# Patient Record
Sex: Female | Born: 1980
Health system: Southern US, Community
[De-identification: ages and names within clinical notes are randomized; demographics above are authoritative.]

## PROBLEM LIST (undated history)

## (undated) DIAGNOSIS — T7840XA Allergy, unspecified, initial encounter: Secondary | ICD-10-CM

## (undated) DIAGNOSIS — U071 COVID-19: Secondary | ICD-10-CM

## (undated) DIAGNOSIS — G43909 Migraine, unspecified, not intractable, without status migrainosus: Secondary | ICD-10-CM

## (undated) HISTORY — DX: COVID-19: U07.1

## (undated) HISTORY — PX: DILATION AND CURETTAGE OF UTERUS: SHX78

## (undated) HISTORY — PX: TONSILLECTOMY: SUR1361

## (undated) HISTORY — PX: WISDOM TOOTH EXTRACTION: SHX21

## (undated) HISTORY — PX: OTHER SURGICAL HISTORY: SHX169

## (undated) HISTORY — DX: Allergy, unspecified, initial encounter: T78.40XA

## (undated) HISTORY — DX: Migraine, unspecified, not intractable, without status migrainosus: G43.909

---

## 2001-04-24 ENCOUNTER — Other Ambulatory Visit: Admission: RE | Admit: 2001-04-24 | Discharge: 2001-04-24 | Payer: Self-pay | Admitting: Family Medicine

## 2002-07-08 ENCOUNTER — Ambulatory Visit (HOSPITAL_BASED_OUTPATIENT_CLINIC_OR_DEPARTMENT_OTHER): Admission: RE | Admit: 2002-07-08 | Discharge: 2002-07-08 | Payer: Self-pay | Admitting: Otolaryngology

## 2005-12-28 ENCOUNTER — Ambulatory Visit: Payer: Self-pay | Admitting: Unknown Physician Specialty

## 2006-10-17 ENCOUNTER — Observation Stay: Payer: Self-pay

## 2006-10-24 ENCOUNTER — Observation Stay: Payer: Self-pay

## 2007-01-14 ENCOUNTER — Observation Stay: Payer: Self-pay

## 2007-01-18 ENCOUNTER — Inpatient Hospital Stay: Payer: Self-pay

## 2010-09-05 ENCOUNTER — Observation Stay: Payer: Self-pay

## 2010-09-18 ENCOUNTER — Inpatient Hospital Stay: Payer: Self-pay

## 2012-07-28 ENCOUNTER — Ambulatory Visit: Payer: Self-pay | Admitting: Neurology

## 2013-06-01 DIAGNOSIS — G43109 Migraine with aura, not intractable, without status migrainosus: Secondary | ICD-10-CM

## 2013-06-01 DIAGNOSIS — G43809 Other migraine, not intractable, without status migrainosus: Secondary | ICD-10-CM | POA: Insufficient documentation

## 2014-04-26 ENCOUNTER — Encounter
Admit: 2014-04-26 | Disposition: A | Discharge status: Home / Self Care | Payer: Self-pay | Attending: Nurse Practitioner | Admitting: Nurse Practitioner

## 2014-05-20 ENCOUNTER — Encounter
Admit: 2014-05-20 | Disposition: A | Discharge status: Home / Self Care | Payer: Self-pay | Attending: Nurse Practitioner | Admitting: Nurse Practitioner

## 2014-06-23 ENCOUNTER — Encounter: Payer: Self-pay | Admitting: Physical Therapy

## 2014-06-30 ENCOUNTER — Encounter: Payer: Self-pay | Admitting: Physical Therapy

## 2014-06-30 ENCOUNTER — Ambulatory Visit: Payer: Managed Care, Other (non HMO) | Attending: Nurse Practitioner | Admitting: Physical Therapy

## 2014-06-30 DIAGNOSIS — M6208 Separation of muscle (nontraumatic), other site: Secondary | ICD-10-CM

## 2014-06-30 DIAGNOSIS — S39001S Unspecified injury of muscle, fascia and tendon of abdomen, sequela: Secondary | ICD-10-CM

## 2014-06-30 DIAGNOSIS — R278 Other lack of coordination: Secondary | ICD-10-CM

## 2014-06-30 DIAGNOSIS — N393 Stress incontinence (female) (male): Secondary | ICD-10-CM | POA: Insufficient documentation

## 2014-07-01 NOTE — Patient Instructions (Signed)
Handout on core exercises to refrain from due to demo'ing lumbar lordosis or straining of pelvic floor mm (see Ex under Treatments) Handout on dynamic stabilization Level 1-3.   Reviewed post-run exercises and guided side-flexion with verbal cues.

## 2014-07-01 NOTE — Therapy (Signed)
[place] MAIN [facility] SERVICES [address] [place], [place], [address] Phone: [phone]   Fax:  [phone]  Physical Therapy Treatment  Patient Details  Name: Carolyn Johnston MRN: [id number] Date of Birth: May 09, 1980 Referring Provider:  Josie Saunders, NP  Encounter Date: 06/30/2014      PT End of Session - 07/01/14 2125    Visit Number 3   Number of Visits 12   Date for PT Re-Evaluation 07/25/14   PT Start Time 2035   PT Stop Time 2130   PT Time Calculation (min) 55 min   Activity Tolerance Patient tolerated treatment well;No increased pain   Behavior During Therapy [facility] for tasks assessed/performed      Past Medical History  Diagnosis Date  . Allergy     trees, dust mites  . Migraines     Past Surgical History  Procedure Laterality Date  . Tonsillectomy    . Dilation and curettage of uterus    . Adnoids suture      There were no vitals filed for this visit.  Visit Diagnosis:  Abnormal coordination  Unspecified injury of muscle, fascia and tendon of abdomen, sequela  Diastasis recti      Subjective Assessment - 06/30/14 1650    Subjective Pt skipped last week's session due to conflicting schedule. Pt reported one urinary leakage epsiode with a sneeze across 2 weeks and had pain with intercourse but was able to adjust. Pt reported she started a new core workout routine. Pt                       Pelvic Floor Special Questions - 06/30/14 2120    Pelvic Floor Internal Exam Noted increased pelvic floor mm ROM with breathing    Exam Type Vaginal   Palpation significantly decreased tensions and tenderness anterior pelvic floor mm compared to previous sessions           [facility] Adult PT Treatment/Exercise - 06/30/14 2121    Exercises   Exercises Other Exercises   Other Exercises  pt demo'd self-selected "core" exercises.Advised pt to  refrain from toe touches(standing), crunches, planks. Guided pt  with neuroredu on dynstabilization L1-3. Pt demo'd correctly with minor tactile Sharlyne Pacas cuing. 5 reps each set.                PT Education - 07/01/14 2125    Education provided Yes   Education Details HEP   Person(s) Educated Patient   Methods Explanation;Demonstration;Tactile cues;Verbal cues;Handout   Comprehension Verbalized understanding;Returned demonstration          PT Short Term Goals - 07/01/14 2134    PT SHORT TERM GOAL #1   Title Pt. will be able to demonstrate coordination with the pelvic diaphragm and breathing diaphragm independently in 4 weeks.    Time 4   Period Weeks   Status Achieved   PT SHORT TERM GOAL #2   Title Pt. will tolerate a normal urinary frequency (2-3 hours) in 6 weeks   Time 6   Period Weeks   Status Partially Met   PT SHORT TERM GOAL #3   Title  Pt will demo no pelvic obliquities across 2 visits in order to progress to strengthening exercises and decrease pain in 6 weeks   Time 6   Period Weeks   Status Partially Met           PT Long Term Goals - 07/01/14 2132  PT LONG TERM GOAL #1   Title Pt would report a decrease in migraines by 50% during 1 week in order to perform work duties at optimal capacity in 12 weeks   Time 12   Period Weeks   Status On-going   PT [place] #2   Title Pt will be independent with completion of home exercise program for pelvic floor exercise, progressing to standing for all ADL's/activities in 4 weeks   Time 12   Period Weeks   Status On-going   PT LONG TERM GOAL #3   Title Patient will decrease her score on PFDI from 37.5% to < 25% in order to improve QOL in 12 weeks   Time 12   Period Weeks   Status On-going   PT LONG TERM GOAL #4   Title Patient will report decreased leaking with stress induced activities such as jumping on the trampoline for 3 min in order to improve QOL in 12 weeks   Time 12   Status On-going               Plan - 07/01/14 2127    Clinical Impression  Statement Pt is progressing towards her goals.She demo'd decreased tensions and tenderness of pelvic floor mm and showed improved pelvic floor ROM with improved breathing technique. Pt remains compliant with HEP and demo'd initiation of dyn stab L1-3 correctly.  Pt will continue benefit from skilled PT to address a need for deep core strengthening.    Pt will benefit from skilled therapeutic intervention in order to improve on the following deficits Decreased coordination;Decreased range of motion;Impaired flexibility;Improper body mechanics;Postural dysfunction;Decreased safety awareness;Decreased endurance;Increased fascial restricitons;Decreased activity tolerance;Decreased mobility;Decreased strength;Increased muscle spasms   Rehab Potential Good   PT Frequency 1x / week   PT Duration 12 weeks   PT Treatment/Interventions ADLs/Self Care Home Management;Moist Heat;Therapeutic activities;Patient/family education;Therapeutic exercise;Biofeedback;Manual techniques;Cryotherapy;Neuromuscular re-education;Functional mobility training;Dry needling;Aquatic Therapy   PT Next Visit Plan reassess DRA, progress to dyn stab L4, posterior chain strengthening with bands   Consulted and Agree with Plan of Care Patient        Problem List There are no active problems to display for this patient.   Jerl Mina ,PT, DPT, E-RYT  07/01/2014, 9:37 PM  [place] MAIN [facility] SERVICES [address] [place], [place], [address] Phone: [phone]   Fax:  [phone]

## 2014-07-07 ENCOUNTER — Ambulatory Visit: Payer: Managed Care, Other (non HMO) | Admitting: Physical Therapy

## 2014-07-07 DIAGNOSIS — M6208 Separation of muscle (nontraumatic), other site: Secondary | ICD-10-CM

## 2014-07-07 DIAGNOSIS — S39001S Unspecified injury of muscle, fascia and tendon of abdomen, sequela: Secondary | ICD-10-CM

## 2014-07-07 DIAGNOSIS — R278 Other lack of coordination: Secondary | ICD-10-CM

## 2014-07-07 DIAGNOSIS — N393 Stress incontinence (female) (male): Secondary | ICD-10-CM | POA: Diagnosis not present

## 2014-07-07 NOTE — Patient Instructions (Signed)
__HEP: jumprope 30 sec with exhalations __Handout for position modifications and tips to decrease pain with sex __Handout for previous HEP for stretches.

## 2014-07-08 NOTE — Therapy (Signed)
[facility] [address] [place], [place], [address] Phone: [phone]   Fax:  [phone]  Physical Therapy Treatment  Patient Details  Name: Carolyn Johnston MRN: [id number] Date of Birth: 1980-02-27 Referring Provider:  Lawernce PittsHuprich, Erin E, NP  Encounter Date: 07/07/2014      PT End of Session - 07/08/14 1527    Visit Number 4   Number of Visits 12   Date for PT Re-Evaluation 07/25/14   PT Start Time 2035   PT Stop Time 2135   PT Time Calculation (min) 60 min   Activity Tolerance Patient tolerated treatment well;No increased pain   Behavior [facility] for tasks assessed/performed      Past Medical History  Diagnosis Date  . Allergy     trees, dust mites  . Migraines     Past Surgical History  Procedure Laterality Date  . Tonsillectomy    . Dilation and curettage of uterus    . Adnoids suture      There were no vitals filed for this visit.  Visit Diagnosis:  Abnormal coordination  Unspecified injury of muscle, fascia and tendon of abdomen, sequela  Diastasis recti      Subjective Assessment - 07/07/14 1711    Subjective Pt has been selecting less bladder irritants and making healthier choices. Pt had one episode of leakage after burping and laughing. Pt reported she is noticing her posture at church and toilet and correcting herself when she slouches. Pt has noticed decreased hip pain with exercises. Pt is concerned about pain during intercourse.  Pt no longer feels the immediate urge to go to the toilet with initial sensation to urinate and also has difficulty with emptying. Pt also reported she is able to ER and abduct her hips in supine/ hip flexion which used to cause her pain.                        Pelvic Floor Special Questions - 07/08/14 1515    Pelvic Floor Internal Exam Noted minimal PFM tensions    Exam Type Vaginal   Palpation significantly decreased tensions and  tenderness anterior pelvic floor mm compared to previous sessions           [facility] Adult PT Treatment/Exercise - 07/08/14 0001    Bed Mobility   Bed Mobility Supine to Sit   Supine to Sit --  body mechanics for < pain w/ intercourse (positions/PFM   Exercises   Exercises Other Exercises  reviewed stretches    Knee/Hip Exercises: Plyometrics   Bilateral Jumping 5 reps  cue to land soft w/ deep core engaged   Other Plyometric Exercises jumping onto low stool and back down with eccentric control   5x   prep for dynamic movement in Zumba class   Other Plyometric Exercises propioception to disassociate pelvic from thorax to prep for dynamic movement in Zumba class  demo'd correctly   Manual Therapy   Manual Therapy Internal Pelvic Floor   Manual therapy comments Noted slight PFM tensions and tenderness anterior obt int and pubococygeus/rectalis mm,    Internal Pelvic Floor theile and sustained pressure. decreased S& S post-Tx.                 PT Education - 07/08/14 1514    Education provided Yes   Education Details HEP   Person(s) Educated Patient   Methods Explanation;Demonstration;Tactile cues;Verbal cues;Handout   Comprehension Verbalized understanding;Returned  demonstration;Verbal cues required;Tactile cues required;Need further instruction          PT Short Term Goals - 07/07/14 1714    PT SHORT TERM GOAL #1   Title Pt. will be able to demonstrate coordination with the pelvic diaphragm and breathing diaphragm independently in 4 weeks.    Time 4   Period Weeks   Status Achieved   PT SHORT TERM GOAL #2   Title Pt. will tolerate a normal urinary frequency (2-3 hours) in 6 weeks   Time 6   Period Weeks   Status Achieved   PT SHORT TERM GOAL #3   Title  Pt will demo no pelvic obliquities across 2 visits in order to progress to strengthening exercises and decrease pain in 6 weeks   Time 6   Period Weeks   Status Achieved           PT Long Term Goals -  07/07/14 1716    PT LONG TERM GOAL #1   Title Pt would report a decrease in migraines by 50% during 1 week in order to perform work duties at optimal capacity in 12 weeks   Time 12   Period Weeks   Status Deferred   PT LONG TERM GOAL #2   Title Pt will be independent with completion of home exercise program for pelvic floor exercise, progressing to standing for all ADL's/activities in 4 weeks   Time 12   Period Weeks   Status Achieved   PT LONG TERM GOAL #3   Title Patient will decrease her score on PFDI from 37.5% to < 25% in order to improve QOL in 12 weeks  (07/07/14: achieved 26%)    Time 12   Period Weeks   Status Achieved   PT LONG TERM GOAL #4   Title Patient will report decreased leaking with stress induced activities such as jumping on the trampoline for 30 sec x 3 in order to improve QOL in 12 weeks   Time 12   Status On-going               Plan - 07/08/14 1525    Clinical Impression Statement Pt has shown significant improvement w/ goals and continues to demo compliance. Pt is close to D/C and is learning self-care principles to minimize injuries in high functioning activities. Possible D/C at next session.    Pt will benefit from skilled therapeutic intervention in order to improve on the following deficits Decreased coordination;Decreased range of motion;Impaired flexibility;Improper body mechanics;Postural dysfunction;Decreased safety awareness;Decreased endurance;Increased fascial restricitons;Decreased activity tolerance;Decreased mobility;Decreased strength;Increased muscle spasms   Rehab Potential Good   PT Frequency 1x / week   PT Duration 12 weeks   PT Treatment/Interventions ADLs/Self Care Home Management;Moist Heat;Therapeutic activities;Patient/family education;Therapeutic exercise;Biofeedback;Manual techniques;Cryotherapy;Neuromuscular re-education;Functional mobility training;Dry needling;Aquatic Therapy   PT Next Visit Plan reassess DRA, progress to dyn  stab L4, posterior chain strengthening with bands   Consulted and Agree with Plan of Care Patient        Problem List There are no active problems to display for this patient.   Mariane MastersYeung,Shin Yiing ,PT, DPT, E-RYT  07/08/2014, 3:28 PM  [place] [facility] [address] [place], [place], [address] Phone: [phone]   Fax:  [phone]

## 2014-07-14 ENCOUNTER — Ambulatory Visit: Payer: Managed Care, Other (non HMO) | Admitting: Physical Therapy

## 2014-07-14 DIAGNOSIS — S39001S Unspecified injury of muscle, fascia and tendon of abdomen, sequela: Secondary | ICD-10-CM

## 2014-07-14 DIAGNOSIS — N393 Stress incontinence (female) (male): Secondary | ICD-10-CM | POA: Diagnosis not present

## 2014-07-14 DIAGNOSIS — R278 Other lack of coordination: Secondary | ICD-10-CM

## 2014-07-14 DIAGNOSIS — M6208 Separation of muscle (nontraumatic), other site: Secondary | ICD-10-CM

## 2014-07-14 NOTE — Therapy (Addendum)
[place] MAIN [facility] SERVICES [address] [place], [place], [address] Phone: [phone]   Fax:  [phone]    PHYSICAL THERAPY DISCHARGE SUMMARY  Visits from Start of Care: 04/26/14   (Pt completed 5 visits)   Current functional level related to goals / functional outcomes: PLOF    Plan: Patient agrees to discharge.  Patient goals were met. Patient is being discharged due to meeting the stated rehab goals.  ?????     Physical Therapy Treatment   Patient Details  Name: JUAN OLTHOFF MRN: [id number] Date of Birth: [birth year] Referring Provider:  Josie Saunders, NP  Encounter Date: 07/14/2014      PT End of Session - 07/14/14 1920    Visit Number 5   Number of Visits 12   Date for PT Re-Evaluation 07/25/14   PT Start Time 5638   PT Stop Time 1708   PT Time Calculation (min) 31 min   Activity Tolerance Patient tolerated treatment well;No increased pain   Behavior During Therapy [facility] for tasks assessed/performed      Past Medical History  Diagnosis Date  . Allergy     trees, dust mites  . Migraines     Past Surgical History  Procedure Laterality Date  . Tonsillectomy    . Dilation and curettage of uterus    . Adnoids suture      There were no vitals filed for this visit.  Visit Diagnosis:  Abnormal coordination  Unspecified injury of muscle, fascia and tendon of abdomen, sequela  Diastasis recti      Subjective Assessment - 07/14/14 1641    Subjective Today, pt reported a decrease in dyspareunia from  50% of the time to 10% of the time in quadriped position. Pt was able to use breathing techniques and pelvic movements to minimize pain. As stated from her last session, pt has been selecting less bladder irritants and making healthier choices. Pt had one episode of leakage after burping and laughing. Pt reported she is noticing her posture at church and toilet and correcting herself when she slouches. Pt has  noticed decreased hip pain with exercises. Pt no longer feels the immediate urge to go to the toilet with initial sensation to urinate and also has no difficulty with complete emptying.                         [facility] Adult PT Treatment/Exercise - 07/14/14 1913    Exercises   Other Exercises  pallof/ D2 extension w/ green band. SLS D1 flex w/ green band.    Knee/Hip Exercises: Plyometrics   Bilateral Jumping 5 reps;1 set                PT Education - 07/14/14 1919    Education provided Yes   Education Details HEP, post-D/C education   Person(s) Educated Patient   Methods Explanation;Demonstration   Comprehension Verbalized understanding;Returned demonstration          PT Short Term Goals - 07/07/14 1714    PT SHORT TERM GOAL #1   Title Pt. will be able to demonstrate coordination with the pelvic diaphragm and breathing diaphragm independently in 4 weeks.    Time 4   Period Weeks   Status Achieved   PT SHORT TERM GOAL #2   Title Pt. will tolerate a normal urinary frequency (2-3 hours) in 6 weeks   Time 6   Period Weeks   Status  Achieved   PT SHORT TERM GOAL #3   Title  Pt will demo no pelvic obliquities across 2 visits in order to progress to strengthening exercises and decrease pain in 6 weeks   Time 6   Period Weeks   Status Achieved           PT Long Term Goals - 07/14/14 1921    PT LONG TERM GOAL #1   Title Pt would report a decrease in migraines by 50% during 1 week in order to perform work duties at optimal capacity in 12 weeks   Time 12   Period Weeks   Status Deferred   PT LONG TERM GOAL #2   Title Pt will be independent with completion of home exercise program for pelvic floor exercise, progressing to standing for all ADL's/activities in 4 weeks   Time 12   Period Weeks   Status Achieved   PT LONG TERM GOAL #3   Title Patient will decrease her score on PFDI from 37.5% to < 25% in order to improve QOL in 12 weeks  (07/07/14:  achieved 26%)    Time 12   Period Weeks   Status Achieved   PT LONG TERM GOAL #4   Title Patient will report decreased leaking with stress induced activities such as jumping on the trampoline for 30 sec x 3 in order to improve QOL in 12 weeks   Time 12   Status Unable to assess               Plan - 07/14/14 1922    Clinical Impression Statement Pt has achieved her goals with excellent compliance to behavorial changes and HEP. Pt demo'd significantly decreased abdominal separation and pelvic floor tensions. Pt has gained ability to elicit pelvic floor ROM and coordinate these muscles correctly with other deep core mm in functional exercises and walking. Pt reported significant improvement with mixed urinary incontinence and dyspareunia complaints. She demo'd increased postural awareness and alignment in stance, sitting, a running, and fitness routines in order to minimize risks for further injuries. Pt is ready for d/c. Thank you for the referral.    Pt will benefit from skilled therapeutic intervention in order to improve on the following deficits Decreased coordination;Decreased range of motion;Impaired flexibility;Improper body mechanics;Postural dysfunction;Decreased safety awareness;Decreased endurance;Increased fascial restrictions;Decreased activity tolerance;Decreased mobility;Decreased strength;Increased muscle spasms   Rehab Potential Good   PT Frequency 1x / week   PT Duration 12 weeks   PT Treatment/Interventions ADLs/Self Care Home Management;Moist Heat;Therapeutic activities;Patient/family education;Therapeutic exercise;Biofeedback;Manual techniques;Cryotherapy;Neuromuscular re-education;Functional mobility training;Dry needling;Aquatic Therapy   PT Next Visit Plan reassess DRA, progress to dyn stab L4, posterior chain strengthening with bands   Consulted and Agree with Plan of Care Patient        Problem List There are no active problems to display for this  patient.   Jerl Mina ,PT, DPT, E-RYT  07/14/2014, 7:26 PM  [place] MAIN [facility] SERVICES [address] [place], [place], [address] Phone: [phone]   Fax:  [phone]

## 2014-07-14 NOTE — Patient Instructions (Signed)
[email or web address]  For the next progression of higher functional fitness exercises post-D/C Cross diagonal (SLS D1 flex, D2 ext, and pallof) w/ green band

## 2016-04-22 ENCOUNTER — Other Ambulatory Visit
Admission: RE | Admit: 2016-04-22 | Discharge: 2016-04-22 | Disposition: A | Discharge status: Home / Self Care | Payer: Commercial Managed Care - PPO | Source: Ambulatory Visit | Attending: Pain Medicine | Admitting: Pain Medicine

## 2016-04-22 ENCOUNTER — Ambulatory Visit
Admission: RE | Admit: 2016-04-22 | Discharge: 2016-04-22 | Disposition: A | Discharge status: Home / Self Care | Payer: Commercial Managed Care - PPO | Source: Ambulatory Visit | Attending: Pain Medicine | Admitting: Pain Medicine

## 2016-04-22 ENCOUNTER — Ambulatory Visit: Payer: Commercial Managed Care - PPO | Attending: Pain Medicine | Admitting: Pain Medicine

## 2016-04-22 ENCOUNTER — Encounter (INDEPENDENT_AMBULATORY_CARE_PROVIDER_SITE_OTHER): Payer: Self-pay

## 2016-04-22 ENCOUNTER — Encounter: Payer: Self-pay | Admitting: Pain Medicine

## 2016-04-22 VITALS — BP 120/64 | HR 66 | Temp 98.3°F | Resp 16 | Ht 62.0 in | Wt 110.0 lb

## 2016-04-22 DIAGNOSIS — G894 Chronic pain syndrome: Secondary | ICD-10-CM

## 2016-04-22 DIAGNOSIS — M5481 Occipital neuralgia: Secondary | ICD-10-CM

## 2016-04-22 DIAGNOSIS — R51 Headache: Principal | ICD-10-CM

## 2016-04-22 DIAGNOSIS — R519 Headache, unspecified: Secondary | ICD-10-CM

## 2016-04-22 DIAGNOSIS — Z87891 Personal history of nicotine dependence: Secondary | ICD-10-CM | POA: Insufficient documentation

## 2016-04-22 DIAGNOSIS — G43909 Migraine, unspecified, not intractable, without status migrainosus: Secondary | ICD-10-CM | POA: Diagnosis not present

## 2016-04-22 LAB — COMPREHENSIVE METABOLIC PANEL
ALBUMIN: 4.4 g/dL (ref 3.5–5.0)
ALT: 13 U/L — ABNORMAL LOW (ref 14–54)
ANION GAP: 7 (ref 5–15)
AST: 16 U/L (ref 15–41)
Alkaline Phosphatase: 60 U/L (ref 38–126)
BILIRUBIN TOTAL: 0.3 mg/dL (ref 0.3–1.2)
BUN: 13 mg/dL (ref 6–20)
CO2: 26 mmol/L (ref 22–32)
Calcium: 9.7 mg/dL (ref 8.9–10.3)
Chloride: 104 mmol/L (ref 101–111)
Creatinine, Ser: 0.77 mg/dL (ref 0.44–1.00)
GFR calc non Af Amer: >60 mL/min (ref >60)
GLUCOSE: 86 mg/dL (ref 65–99)
POTASSIUM: 3.7 mmol/L (ref 3.5–5.1)
SODIUM: 137 mmol/L (ref 135–145)
Total Protein: 8.1 g/dL (ref 6.5–8.1)

## 2016-04-22 LAB — MAGNESIUM: MAGNESIUM: 2.1 mg/dL (ref 1.7–2.4)

## 2016-04-22 LAB — C-REACTIVE PROTEIN: CRP: <0.8 mg/dL (ref <1.0)

## 2016-04-22 LAB — VITAMIN B12: VITAMIN B 12: 283 pg/mL (ref 180–914)

## 2016-04-22 LAB — SEDIMENTATION RATE: SED RATE: 32 mm/h — AB (ref 0–20)

## 2016-04-22 NOTE — Patient Instructions (Addendum)
You were instructed to get labwork and x[facility].

## 2016-04-22 NOTE — Progress Notes (Signed)
Patient's Name: Carolyn Johnston  MRN: [id number]  Referring Provider: Lonell FaceShah, Hemang K, MD  DOB: 07/07/1980  PCP: [facility]  DOS: 04/22/2016  Note by: Sydnee LevansFrancisco A. Laban EmperorNaveira, MD  Service setting: Ambulatory outpatient  Specialty: Interventional Pain Management  Location: [facility] (AMB) Pain Management Facility    Patient type: New Patient   Primary Reason(s) for Visit: Initial Patient Evaluation CC: Migraine  HPI  Ms. Horald ChestnutVirgen is a 36 y.o. year old, female patient, who comes today for an initial evaluation. She has Vestibular migraine; Recurrent occipital headache; Bilateral occipital neuralgia; Cervico-occipital neuralgia of right side; Cervico-occipital neuralgia of left side; and Chronic pain syndrome on her problem list.. Her primarily concern today is the Migraine  Pain Assessment: Self-Reported Pain Score: 0-No pain/10             Reported level is compatible with observation.       Pain Type: Chronic pain Pain Location: Head Pain Descriptors / Indicators: Dull, Throbbing, Sharp, Tingling (dull throb in front of eyes, as gets worse it becomes sharp, when it reaches back of head it throbs.) Pain Frequency: Intermittent  Onset and Duration: Gradual and Date of onset: 2014 Cause of pain: Unknown Severity: NAS-11 at its worse: 10/10, NAS-11 at its best: 1/10, NAS-11 now: 0/10 and NAS-11 on the average: 5/10 Timing: Not influenced by the time of the day Aggravating Factors: The patient denies worsening of the pain with bending, bowel movements, climbing, eating, intercourse, kneeling, lifting, motion, nerve blocks, sitting for prolonged periods of time, standing for prolonged periods of time, squatting, stooping, surgery, twisting, walking, walking uphill, walking downhill, or working. Alleviating Factors: Lying down, Medications and Sleeping Associated Problems: Inability to concentrate and Nausea Quality of Pain: Aching, Pressure-like, Sharp, Superficial, Tender, Throbbing and  Tingling Previous Examinations or Tests: MRI scan Previous Treatments: Chiropractic manipulations  The patient comes into the clinics today for the first time for a chronic pain management evaluation. Today the patient is having no pain. She initially was under the impression that this was a post dural puncture headache because of how similar it was 2 her episode of a postdural puncture headache that she had immediately after an epidural. However, this particular pain does not completely go away when she is laying flat on her back and return when she sits up or stands up. In addition, she will have periods of time were she has no headaches. This to do not go along with a history of a postdural puncture headache. In addition, the distribution that she describes of the headache follows that of a lesser occipital nerve, bilaterally. The headaches usually start in the back of the neck in the occipital region bilaterally and then they travel towards the frontal area through the area right above the ears. When he gets to the frontal area it feels like a headband but she also has been experiencing pain over the maxillary sinuses, bilaterally.  Today I took the time to provide the patient with information regarding my pain practice. The patient was informed that my practice is divided into two sections: an interventional pain management section, as well as a completely separate and distinct medication management section. The interventional portion of my practice takes place on Tuesdays and Thursdays, while the medication management is conducted on Mondays and Wednesdays. Because of the amount of documentation required on both them, they are kept separated. This means that there is the possibility that the patient may be scheduled for a procedure on Tuesday,  while also having a medication management appointment on Wednesday. I have also informed the patient that because of current staffing and facility limitations, I  no longer take patients for medication management only. To illustrate the reasons for this, I gave the patient the example of a surgeon and how inappropriate it would be to refer a patient to his/her practice so that they write for the post-procedure antibiotics on a surgery done by someone else.   The patient was informed that joining my practice means that they are open to any and all interventional therapies. I clarified for the patient that this does not mean that they will be forced to have any procedures done. What it means is that patients looking for a practitioner to simply write for their pain medications and not take advantage of other interventional techniques will be better served by a different practitioner, other than myself. I made it clear that I prefer to spend my time providing those services that I specialize in.  The patient was also made aware of my Comprehensive Pain Management Safety Guidelines where by joining my practice, they limit all of their nerve blocks and joint injections to those done by our practice, for as long as we are retained to manage their care.   Historic Controlled Substance Pharmacotherapy Review  PMP and historical list of controlled substances: None  Historical Monitoring: The patient  reports that she does not use drugs.. List of all UDS Test(s): No results found List of all Serum Drug Screening Test(s):  No results found Historical Background Evaluation: [place] PDMP: Six (6) year initial data search conducted. No abnormal patterns identified. No use of controlled substance [place] Department of public safety, offender search: Engineer, mining Information) Non-contributory Risk Assessment Profile: Aberrant behavior: None observed or detected today Risk factors for fatal opioid overdose: None identified today Fatal overdose hazard ratio (HR): Calculation deferred Non-fatal overdose hazard ratio (HR): Calculation deferred Risk of opioid abuse or dependence: 0.7-3.0%  with doses ? 36 MME/day and 6.1-26% with doses ? 120 MME/day. Substance use disorder (SUD) risk level: Pending results of Medical Psychology Evaluation for SUD Opioid risk tool (ORT) (Total Score): 4  ORT Scoring interpretation table:  Score <3 = Low Risk for SUD  Score between 4-7 = Moderate Risk for SUD  Score >8 = High Risk for Opioid Abuse   PHQ-2 Depression Scale:  Total score: 0  PHQ-2 Scoring interpretation table: (Score and probability of major depressive disorder)  Score 0 = No depression  Score 1 = 15.4% Probability  Score 2 = 21.1% Probability  Score 3 = 38.4% Probability  Score 4 = 45.5% Probability  Score 5 = 56.4% Probability  Score 6 = 78.6% Probability   PHQ-9 Depression Scale:  Total score: 0  PHQ-9 Scoring interpretation table:  Score 0-4 = No depression  Score 5-9 = Mild depression  Score 10-14 = Moderate depression  Score 15-19 = Moderately severe depression  Score 20-27 = Severe depression (2.4 times higher risk of SUD and 2.89 times higher risk of overuse)   Pharmacologic Plan: Pending ordered tests and/or consults  Meds  The patient has a current medication list which includes the following prescription(s): baclofen, ibuprofen, norgestimate-ethinyl estradiol, spironolactone, tazarotene, and valacyclovir.  Current Outpatient Prescriptions on File Prior to Visit  Medication Sig  . valACYclovir (VALTREX) 1000 MG tablet Take 1,000 mg by mouth 2 (two) times daily.   No current facility-administered medications on file prior to visit.    Imaging Review  Note: No results found under the [facility] electronic medical record.        ROS  Cardiovascular History: Negative for hypertension, coronary artery diseas, myocardial infraction, anticoagulant therapy or heart failure Pulmonary or Respiratory History: Negative for bronchial asthma, emphysema, chronic smoking, chronic bronchitis, sarcoidosis, tuberculosis or sleep apena Neurological History:  Negative for epilepsy, stroke, urinary or fecal inontinence, spina bifida or tethered cord syndrome Review of Past Neurological Studies: No results found for this or any previous visit. Psychological-Psychiatric History: Negative for anxiety, depression, schizophrenia, bipolar disorders or suicidal ideations or attempts Gastrointestinal History: Negative for peptic ulcer disease, hiatal hernia, GERD, IBS, hepatitis, cirrhosis or pancreatitis Genitourinary History: Negative for nephrolithiasis, hematuria, renal failure or chronic kidney disease Hematological History: Negative for anticoagulant therapy, anemia, bruising or bleeding easily, hemophilia, sickle cell disease or trait, thrombocytopenia or coagulupathies Endocrine History: Negative for diabetes or thyroid disease Rheumatologic History: Negative for lupus, osteoarthritis, rheumatoid arthritis, myositis, polymyositis or fibromyagia Musculoskeletal History: Negative for myasthenia gravis, muscular dystrophy, multiple sclerosis or malignant hyperthermia Work History: Working full time  Allergies  Ms. Alvis has No Known Allergies.  Laboratory Chemistry  Inflammation Markers No results found for: ESRSEDRATE, CRP Renal Function Markers No results found for: BUN, CREATININE, GFRAA, GFRNONAA Hepatic Function Markers No results found for: AST, ALT, ALBUMIN, ALKPHOS, HCVAB Electrolytes No results found for: NA, K, CL, CALCIUM, MG Neuropathy Markers No results found for: [id number] Bone Pathology Markers No results found for: Lillia Abed, [id number], [id number], [id number], 25OHVITD1, 25OHVITD2, 25OHVITD3, CALCIUM, TESTOFREE, TESTOSTERONE Coagulation Parameters No results found for: INR, LABPROT, APTT, PLT Cardiovascular Markers No results found for: BNP, HGB, HCT Note: No results found under the [facility] electronic medical record  [facility]  Drug: Ms. Guettler  reports that she does not use drugs. Alcohol:  reports that she does  not drink alcohol. Tobacco:  reports that she quit smoking about 7 years ago. She has never used smokeless tobacco. Medical:  has a past medical history of Allergy and Migraines. Family: family history includes Arthritis in her mother; Drug abuse in her father.  Past Surgical History:  Procedure Laterality Date  . adnoids suture    . DILATION AND CURETTAGE OF UTERUS    . TONSILLECTOMY     Active Ambulatory Problems    Diagnosis Date Noted  . Vestibular migraine 06/01/2013  . Recurrent occipital headache 04/22/2016  . Bilateral occipital neuralgia 04/22/2016  . Cervico-occipital neuralgia of right side 04/22/2016  . Cervico-occipital neuralgia of left side 04/22/2016  . Chronic pain syndrome 04/22/2016   Resolved Ambulatory Problems    Diagnosis Date Noted  . No Resolved Ambulatory Problems   Past Medical History:  Diagnosis Date  . Allergy   . Migraines    Constitutional Exam  General appearance: Well nourished, well developed, and well hydrated. In no apparent acute distress Vitals:   04/22/16 1129  BP: 120/64  Pulse: 66  Resp: 16  Temp: 98.3 F (36.8 C)  SpO2: 100%  Weight: 110 lb (49.9 kg)  Height: 5\' 2"  (1.575 m)   BMI Assessment: Estimated body mass index is 20.12 kg/m as calculated from the following:   Height as of this encounter: 5\' 2"  (1.575 m).   Weight as of this encounter: 110 lb (49.9 kg).  BMI interpretation table: BMI level Category Range association with higher incidence of chronic pain  <18 kg/m2 Underweight   18.5-24.9 kg/m2 Ideal body weight   25-29.9 kg/m2 Overweight Increased incidence by 20%  30-34.9 kg/m2 Obese (Class  I) Increased incidence by 68%  35-39.9 kg/m2 Severe obesity (Class II) Increased incidence by 136%  >40 kg/m2 Extreme obesity (Class III) Increased incidence by 254%   BMI Readings from Last 4 Encounters:  04/22/16 20.12 kg/m   Wt Readings from Last 4 Encounters:  04/22/16 110 lb (49.9 kg)  Psych/Mental status: Alert,  oriented x 3 (person, place, & time)       Eyes: PERLA Respiratory: No evidence of acute respiratory distress  Cervical Spine Exam  Inspection: No masses, redness, or swelling Alignment: Symmetrical Functional ROM: Unrestricted ROM Stability: No instability detected Muscle strength & Tone: Functionally intact Sensory: Pain pattern seems to be following the distribution of the lesser occipital nerve, bilaterally. Palpation: Tender, more so on the left than on the right side.  Upper Extremity (UE) Exam    Side: Right upper extremity  Side: Left upper extremity  Inspection: No masses, redness, swelling, or asymmetry. No contractures  Inspection: No masses, redness, swelling, or asymmetry. No contractures  Functional ROM: Unrestricted ROM          Functional ROM: Unrestricted ROM          Muscle strength & Tone: Functionally intact  Muscle strength & Tone: Functionally intact  Sensory: Unimpaired  Sensory: Unimpaired  Palpation: Euthermic  Palpation: Euthermic  Specialized Test(s): Deferred         Specialized Test(s): Deferred          Thoracic Spine Exam  Inspection: No masses, redness, or swelling Alignment: Symmetrical Functional ROM: Unrestricted ROM Stability: No instability detected Sensory: Unimpaired Muscle strength & Tone: Functionally intact Palpation: Non-contributory  Lumbar Spine Exam  Inspection: No masses, redness, or swelling Alignment: Symmetrical Functional ROM: Unrestricted ROM Stability: No instability detected Muscle strength & Tone: Functionally intact Sensory: Unimpaired Palpation: Non-contributory Provocative Tests: Lumbar Hyperextension and rotation test: evaluation deferred today       Patrick's Maneuver: evaluation deferred today              Gait & Posture Assessment  Ambulation: Unassisted Gait: Relatively normal for age and body habitus Posture: WNL   Lower Extremity Exam    Side: Right lower extremity  Side: Left lower extremity   Inspection: No masses, redness, swelling, or asymmetry. No contractures  Inspection: No masses, redness, swelling, or asymmetry. No contractures  Functional ROM: Unrestricted ROM          Functional ROM: Unrestricted ROM          Muscle strength & Tone: Functionally intact  Muscle strength & Tone: Functionally intact  Sensory: Unimpaired  Sensory: Unimpaired  Palpation: No palpable anomalies  Palpation: No palpable anomalies   Assessment  Primary Diagnosis & Pertinent Problem List: The primary encounter diagnosis was Bilateral occipital neuralgia. Diagnoses of Recurrent occipital headache, Cervico-occipital neuralgia of right side, Cervico-occipital neuralgia of left side, and Chronic pain syndrome were also pertinent to this visit.  Visit Diagnosis: 1. Bilateral occipital neuralgia   2. Recurrent occipital headache   3. Cervico-occipital neuralgia of right side   4. Cervico-occipital neuralgia of left side   5. Chronic pain syndrome    Plan of Care  Initial treatment plan:  Please be advised that as per protocol, today's visit has been an evaluation only. We have not taken over the patient's controlled substance management.  Problem-specific plan: No problem-specific Assessment & Plan notes found for this encounter.  Ordered Lab-work, Procedure(s), Referral(s), & Consult(s): Orders Placed This Encounter  Procedures  . DG Cervical Spine Complete  .  Comprehensive metabolic panel  . C-reactive protein  . Magnesium  . Sedimentation rate  . Vitamin B12  . 25-Hydroxyvitamin D Lcms D2+D3   Pharmacotherapy: Medications ordered:  No orders of the defined types were placed in this encounter.  Medications administered during this visit: Ms. Delmont had no medications administered during this visit.   Pharmacotherapy under consideration:  Opioid Analgesics: The patient was informed that there is no guarantee that she would be a candidate for opioid analgesics. The decision will be  made following CDC guidelines. This decision will be based on the results of diagnostic studies, as well as Ms. Staff's risk profile.  Membrane stabilizer: To be determined at a later time Muscle relaxant: To be determined at a later time NSAID: To be determined at a later time Other analgesic(s): To be determined at a later time   Interventional therapies under consideration: Ms. Rampy was informed that there is no guarantee that she would be a candidate for interventional therapies. The decision will be based on the results of diagnostic studies, as well as Ms. Mcclard's risk profile.  Possible procedure(s): Diagnostic bilateral lesser occipital nerve block under fluoroscopic guidance. Possible bilateral occipital nerve RFA  Diagnostic bilateral C2 + TON nerve blocks  Possible bilateral C2 + TON RFA    Provider-requested follow-up: Return for after ordered test(s).  Future Appointments Date Time Provider Department Center  04/23/2016 10:45 AM Delano Metz, MD [facility] None    Primary Care Physician: [facility] Location: [facility] Main Outpatient Pain Management Facility Note by: Sydnee Levans. Laban Emperor, M.D, DABA, DABAPM, DABPM, DABIPP, FIPP Date: 04/22/2016; Time: 1:06 PM  Pain Score Disclaimer: We use the NRS-11 scale. This is a self-reported, subjective measurement of pain severity with only modest accuracy. It is used primarily to identify changes within a particular patient. It must be understood that outpatient pain scales are significantly less accurate that those used for research, where they can be applied under ideal controlled circumstances with minimal exposure to variables. In reality, the score is likely to be a combination of pain intensity and pain affect, where pain affect describes the degree of emotional arousal or changes in action readiness caused by the sensory experience of pain. Factors such as social and work situation, setting, emotional state, anxiety  levels, expectation, and prior pain experience may influence pain perception and show large inter-individual differences that may also be affected by time variables.  Patient instructions provided during this appointment: Patient Instructions  You were instructed to get labwork and xrays  done at the medical mall today.

## 2016-04-22 NOTE — Progress Notes (Signed)
Safety precautions to be maintained throughout the outpatient stay will include: orient to surroundings, keep bed in low position, maintain call bell within reach at all times, provide assistance with transfer out of bed and ambulation.  

## 2016-04-23 ENCOUNTER — Ambulatory Visit: Payer: Commercial Managed Care - PPO | Attending: Pain Medicine | Admitting: Pain Medicine

## 2016-04-23 ENCOUNTER — Encounter: Payer: Self-pay | Admitting: Pain Medicine

## 2016-04-23 VITALS — BP 115/59 | HR 66 | Temp 98.7°F | Resp 18 | Ht 62.0 in | Wt 110.0 lb

## 2016-04-23 DIAGNOSIS — Z87891 Personal history of nicotine dependence: Secondary | ICD-10-CM | POA: Insufficient documentation

## 2016-04-23 DIAGNOSIS — G894 Chronic pain syndrome: Secondary | ICD-10-CM | POA: Diagnosis not present

## 2016-04-23 DIAGNOSIS — R519 Headache, unspecified: Secondary | ICD-10-CM

## 2016-04-23 DIAGNOSIS — R51 Headache: Secondary | ICD-10-CM | POA: Diagnosis not present

## 2016-04-23 DIAGNOSIS — Z79899 Other long term (current) drug therapy: Secondary | ICD-10-CM | POA: Insufficient documentation

## 2016-04-23 DIAGNOSIS — M5481 Occipital neuralgia: Secondary | ICD-10-CM | POA: Insufficient documentation

## 2016-04-23 MED ORDER — METHYLPREDNISOLONE 4 MG PO TBPK: ORAL_TABLET | ORAL | 0 refills | Status: AC

## 2016-04-23 NOTE — Progress Notes (Signed)
Patient's Name: Carolyn Johnston  MRN: [id number]  Referring Provider: No ref. provider found  DOB: November 14, 1980  PCP: [facility]  DOS: 04/23/2016  Note by: Sydnee Levans. Laban Emperor, MD  Service setting: Ambulatory outpatient  Specialty: Interventional Pain Management  Location: [facility] (AMB) Pain Management Facility    Patient type: Established   Primary Reason(s) for Visit: Encounter for evaluation before starting new chronic pain management plan of care (Level of risk: moderate) CC: Migraine (denies pain today)  HPI  Carolyn Johnston is a 36 y.o. year old, female patient, who comes today for a follow-up evaluation to review the test results and decide on a treatment plan. She has Vestibular migraine; Recurrent occipital headache; Bilateral occipital neuralgia; Cervico-occipital neuralgia of right side; Cervico-occipital neuralgia of left side; and Chronic pain syndrome on her problem list. Her primarily concern today is the Migraine (denies pain today)  Pain Assessment: Self-Reported Pain Score: 0-No pain/10             Reported level is compatible with observation.       Pain Frequency: Intermittent  Carolyn Johnston comes in today for a follow-up visit after her initial evaluation on 04/22/2016. Today we went over the results of her tests. These were explained in "Layman's terms". During today's appointment we went over my diagnostic impression, as well as the proposed treatment plan.   In considering the treatment plan options, Carolyn Johnston was reminded that I no longer take patients for medication management only. I asked her to let me know if she had no intention of taking advantage of the interventional therapies, so that we could make arrangements to provide this space to someone interested. I also made it clear that undergoing interventional therapies for the purpose of getting pain medications is very inappropriate on the part of a patient, and it will not be tolerated in this practice. This type  of behavior would suggest true addiction and therefore it requires referral to an addiction specialist.   Further details on both, my assessment(s), as well as the proposed treatment plan, please see below. Controlled Substance Pharmacotherapy Assessment REMS (Risk Evaluation and Mitigation Strategy)  Analgesic: No opioids MME/day: 0 mg/day.  Laboratory Chemistry  Inflammation Markers Lab Results  Component Value Date   ESRSEDRATE 32 (H) 04/22/2016   CRP <0.8 04/22/2016   Renal Function Markers Lab Results  Component Value Date   BUN 13 04/22/2016   CREATININE 0.77 04/22/2016   GFRAA >60 04/22/2016   GFRNONAA >60 04/22/2016   Hepatic Function Markers Lab Results  Component Value Date   AST 16 04/22/2016   ALT 13 (L) 04/22/2016   ALBUMIN 4.4 04/22/2016   ALKPHOS 60 04/22/2016   Electrolytes Lab Results  Component Value Date   NA 137 04/22/2016   K 3.7 04/22/2016   CL 104 04/22/2016   CALCIUM 9.7 04/22/2016   MG 2.1 04/22/2016   Neuropathy Markers Lab Results  Component Value Date   VITAMINB12 283 04/22/2016   Bone Pathology Markers Lab Results  Component Value Date   ALKPHOS 60 04/22/2016   CALCIUM 9.7 04/22/2016   Coagulation Parameters No results found for: INR, LABPROT, APTT, PLT Cardiovascular Markers No results found for: BNP, HGB, HCT Note: Lab results reviewed and explained to patient in Layman's terms.  Recent Diagnostic Imaging Review  Dg Cervical Spine Complete Result Date: 04/22/2016 CLINICAL DATA:  Recurrent occipital headache R51 (ICD-10-CM)Bilateral occipital neuralgia M54.81 (ICD-10-CM)Cervico-occipital neuralgia of right side M54.81 (ICD-10-CM)Cervico-occipital neuralgia of left side M54.81 (  ICD-10-CM) EXAM: CERVICAL SPINE - COMPLETE 4+ VIEW COMPARISON:  None. FINDINGS: There is no evidence of cervical spine fracture or prevertebral soft tissue swelling. Alignment is normal. No other significant bone abnormalities are identified. IMPRESSION:  Negative cervical spine radiographs. Electronically Signed   By: Marlan Palauharles  Clark M.D.   On: 04/22/2016 14:40   Cervical Imaging: Cervical DG complete:  Results for orders placed during the hospital encounter of 04/22/16  DG Cervical Spine Complete   Narrative CLINICAL DATA:  Recurrent occipital headache R51 (ICD-10-CM)Bilateral occipital neuralgia M54.81 (ICD-10-CM)Cervico-occipital neuralgia of right side M54.81 (ICD-10-CM)Cervico-occipital neuralgia of left side M54.81 (ICD-10-CM)  EXAM: CERVICAL SPINE - COMPLETE 4+ VIEW  COMPARISON:  None.  FINDINGS: There is no evidence of cervical spine fracture or prevertebral soft tissue swelling. Alignment is normal. No other significant bone abnormalities are identified.  IMPRESSION: Negative cervical spine radiographs.   Electronically Signed   By: Marlan Palauharles  Clark M.D.   On: 04/22/2016 14:40    Note: Results of ordered imaging test(s) reviewed and explained to patient in Layman's terms. Results made available to patient  Meds  The patient has a current medication list which includes the following prescription(s): baclofen, ibuprofen, methylprednisolone, norgestimate-ethinyl estradiol, spironolactone, tazarotene, and valacyclovir.  Current Outpatient Prescriptions on File Prior to Visit  Medication Sig  . baclofen (LIORESAL) 10 MG tablet 10 mg as needed.   . Ibuprofen (ADVIL MIGRAINE) 200 MG CAPS Take 2 capsules by mouth as needed.  . norgestimate-ethinyl estradiol (PREVIFEM) 0.25-35 MG-MCG tablet   . spironolactone (ALDACTONE) 25 MG tablet Take by mouth.  . tazarotene (TAZORAC) 0.1 % gel Apply topically.  . valACYclovir (VALTREX) 1000 MG tablet Take 1,000 mg by mouth 2 (two) times daily.   No current facility-administered medications on file prior to visit.    ROS  Constitutional: Denies any fever or chills Gastrointestinal: No reported hemesis, hematochezia, vomiting, or acute GI distress Musculoskeletal: Denies any acute  onset joint swelling, redness, loss of ROM, or weakness Neurological: No reported episodes of acute onset apraxia, aphasia, dysarthria, agnosia, amnesia, paralysis, loss of coordination, or loss of consciousness  Allergies  Ms. Horald ChestnutVirgen has No Known Allergies.  PFSH  Drug: Ms. Horald ChestnutVirgen  reports that she does not use drugs. Alcohol:  reports that she does not drink alcohol. Tobacco:  reports that she quit smoking about 7 years ago. She has never used smokeless tobacco. Medical:  has a past medical history of Allergy and Migraines. Family: family history includes Arthritis in her mother; Drug abuse in her father.  Past Surgical History:  Procedure Laterality Date  . adnoids suture    . DILATION AND CURETTAGE OF UTERUS    . TONSILLECTOMY     Constitutional Exam  General appearance: Well nourished, well developed, and well hydrated. In no apparent acute distress Vitals:   04/23/16 1039  BP: (!) 115/59  Pulse: 66  Resp: 18  Temp: 98.7 F (37.1 C)  TempSrc: Oral  SpO2: 100%  Weight: 110 lb (49.9 kg)  Height: 5\' 2"  (1.575 m)   BMI Assessment: Estimated body mass index is 20.12 kg/m as calculated from the following:   Height as of this encounter: 5\' 2"  (1.575 m).   Weight as of this encounter: 110 lb (49.9 kg).  BMI interpretation table: BMI level Category Range association with higher incidence of chronic pain  <18 kg/m2 Underweight   18.5-24.9 kg/m2 Ideal body weight   25-29.9 kg/m2 Overweight Increased incidence by 20%  30-34.9 kg/m2 Obese (Class I) Increased incidence by  68%  35-39.9 kg/m2 Severe obesity (Class II) Increased incidence by 136%  >40 kg/m2 Extreme obesity (Class III) Increased incidence by 254%   BMI Readings from Last 4 Encounters:  04/23/16 20.12 kg/m  04/22/16 20.12 kg/m   Wt Readings from Last 4 Encounters:  04/23/16 110 lb (49.9 kg)  04/22/16 110 lb (49.9 kg)  Psych/Mental status: Alert, oriented x 3 (person, place, & time)       Eyes:  PERLA Respiratory: No evidence of acute respiratory distress  Cervical Spine Exam  Inspection: No masses, redness, or swelling Alignment: Symmetrical Functional ROM: Unrestricted ROM Stability: No instability detected Muscle strength & Tone: Functionally intact Sensory: Unimpaired Palpation: Non-contributory  Upper Extremity (UE) Exam    Side: Right upper extremity  Side: Left upper extremity  Inspection: No masses, redness, swelling, or asymmetry. No contractures  Inspection: No masses, redness, swelling, or asymmetry. No contractures  Functional ROM: Unrestricted ROM          Functional ROM: Unrestricted ROM          Muscle strength & Tone: Functionally intact  Muscle strength & Tone: Functionally intact  Sensory: Unimpaired  Sensory: Unimpaired  Palpation: Euthermic  Palpation: Euthermic  Specialized Test(s): Deferred         Specialized Test(s): Deferred          Thoracic Spine Exam  Inspection: No masses, redness, or swelling Alignment: Symmetrical Functional ROM: Unrestricted ROM Stability: No instability detected Sensory: Unimpaired Muscle strength & Tone: Functionally intact Palpation: Non-contributory  Lumbar Spine Exam  Inspection: No masses, redness, or swelling Alignment: Symmetrical Functional ROM: Unrestricted ROM Stability: No instability detected Muscle strength & Tone: Functionally intact Sensory: Unimpaired Palpation: Non-contributory Provocative Tests: Lumbar Hyperextension and rotation test: evaluation deferred today       Patrick's Maneuver: evaluation deferred today              Gait & Posture Assessment  Ambulation: Unassisted Gait: Relatively normal for age and body habitus Posture: WNL   Lower Extremity Exam    Side: Right lower extremity  Side: Left lower extremity  Inspection: No masses, redness, swelling, or asymmetry. No contractures  Inspection: No masses, redness, swelling, or asymmetry. No contractures  Functional ROM: Unrestricted ROM           Functional ROM: Unrestricted ROM          Muscle strength & Tone: Functionally intact  Muscle strength & Tone: Functionally intact  Sensory: Unimpaired  Sensory: Unimpaired  Palpation: No palpable anomalies  Palpation: No palpable anomalies   Assessment & Plan  Primary Diagnosis & Pertinent Problem List: The primary encounter diagnosis was Recurrent occipital headache. Diagnoses of Chronic pain syndrome, Cervico-occipital neuralgia of right side, Cervico-occipital neuralgia of left side, and Bilateral occipital neuralgia were also pertinent to this visit.  Visit Diagnosis: 1. Recurrent occipital headache   2. Chronic pain syndrome   3. Cervico-occipital neuralgia of right side   4. Cervico-occipital neuralgia of left side   5. Bilateral occipital neuralgia    Problems updated and reviewed during this visit: No problems updated. Problem-specific Plan(s): No problem-specific Assessment & Plan notes found for this encounter.  Assessment & plan notes cannot be loaded without a specified hospital service.  Plan of Care  Pharmacotherapy (Medications Ordered): Meds ordered this encounter  Medications  . methylPREDNISolone (MEDROL) 4 MG TBPK tablet    Sig: Follow package instructions.    Dispense:  21 tablet    Refill:  0    Do  not add to the "Automatic Refill" notification system.   Lab-work, procedure(s), and/or referral(s): No orders of the defined types were placed in this encounter.   Pharmacotherapy: Opioid Analgesics: Not indicated at this time Membrane stabilizer: None prescribed at this time Muscle relaxant: None prescribed at this time NSAID: None prescribed at this time Other analgesic(s): Medrol Dosepak trial   Interventional therapies: Planned, scheduled, and/or pending:    Medrol Dosepak trial used    Considering:   Diagnostic bilateral lesser occipital nerve block under fluoroscopic guidance. Possible bilateral occipital nerve RFA  Diagnostic  bilateral C2 + TON nerve blocks  Possible bilateral C2 + TON RFA    PRN Procedures:   To be determined at a later time   Provider-requested follow-up: Return if symptoms worsen or fail to improve.  No future appointments.  Primary Care Physician: [facility] Location: [facility] Outpatient [facility] Management Facility Note by: Sydnee Levans. Laban Emperor, M.D, DABA, DABAPM, DABPM, DABIPP, FIPP Date: 04/23/2016; Time: 1:11 PM  Pain Score Disclaimer: We use the NRS-11 scale. This is a self-reported, subjective measurement of pain severity with only modest accuracy. It is used primarily to identify changes within a particular patient. It must be understood that outpatient pain scales are significantly less accurate that those used for research, where they can be applied under ideal controlled circumstances with minimal exposure to variables. In reality, the score is likely to be a combination of pain intensity and pain affect, where pain affect describes the degree of emotional arousal or changes in action readiness caused by the sensory experience of pain. Factors such as social and work situation, setting, emotional state, anxiety levels, expectation, and prior pain experience may influence pain perception and show large inter-individual differences that may also be affected by time variables.  Patient instructions provided during this appointment: There are no Patient Instructions on file for this visit.

## 2016-04-23 NOTE — Progress Notes (Signed)
Safety precautions to be maintained throughout the outpatient stay will include: orient to surroundings, keep bed in low position, maintain call bell within reach at all times, provide assistance with transfer out of bed and ambulation.  

## 2016-04-26 LAB — 25-HYDROXY VITAMIN D LCMS D2+D3
25-Hydroxy, Vitamin D-2: <1 ng/mL
25-Hydroxy, Vitamin D-3: 28 ng/mL
25-Hydroxy, Vitamin D: 28 ng/mL — ABNORMAL LOW

## 2016-06-06 ENCOUNTER — Encounter: Payer: Self-pay | Admitting: Nurse Practitioner

## 2016-06-06 DIAGNOSIS — E559 Vitamin D deficiency, unspecified: Secondary | ICD-10-CM | POA: Insufficient documentation

## 2016-07-03 ENCOUNTER — Telehealth: Payer: Self-pay | Admitting: Obstetrics and Gynecology

## 2016-07-03 NOTE — Telephone Encounter (Signed)
Patient called and is in need of a refill on her Valtrex.  Please advise patient as she has left msgs on the nurse line and no one has called her back.  Please advise CB# [phone] Pharm. [facility]  [address]

## 2016-07-03 NOTE — Telephone Encounter (Signed)
Pt aware it has been over a year for her annual. She needs to schedule an annual before refill can be given. She was seeing Erin H. So annual can be with anyone.

## 2016-07-04 ENCOUNTER — Telehealth: Payer: Self-pay | Admitting: Obstetrics and Gynecology

## 2016-07-04 ENCOUNTER — Other Ambulatory Visit: Payer: Self-pay | Admitting: Obstetrics and Gynecology

## 2016-07-04 MED ORDER — VALACYCLOVIR HCL 1 G PO TABS: 1000.0000 mg | ORAL_TABLET | Freq: Every day | ORAL | 2 refills | Status: DC

## 2016-07-04 NOTE — Telephone Encounter (Signed)
Pt aware, Rx has been sent in to pharmacy

## 2016-07-04 NOTE — Telephone Encounter (Signed)
Left voicemail for pt to call back to be schedule °

## 2016-07-04 NOTE — Telephone Encounter (Signed)
Please refill Valtrex. Annual is scheduled with you on 6/7

## 2016-07-04 NOTE — Telephone Encounter (Signed)
Rx has been sent  

## 2016-07-04 NOTE — Telephone Encounter (Signed)
Patient called and scheduled her annual.   She stated that they said they would refill her rx as soon as she did.   Please send refill. Thanks

## 2016-07-19 ENCOUNTER — Telehealth: Payer: Self-pay

## 2016-07-19 NOTE — Telephone Encounter (Signed)
Patient is requesting to establish care. She needs a CPE with pap. Insurance is [employer].

## 2016-07-23 NOTE — Telephone Encounter (Signed)
Scheduled appointment for 08/05/16@3 /MW

## 2016-07-23 NOTE — Telephone Encounter (Signed)
LMTCB to schedule new patient appointment. °

## 2016-07-23 NOTE — Telephone Encounter (Signed)
Yes please schedule. Please let her know that she has history of abnormal PAP and migraines, and so I would defer to gynecology for prescription of estrogen containing medications and she needs three normal paps following her abnormal, bu totherwise would be happy to see her.

## 2016-07-25 ENCOUNTER — Ambulatory Visit: Payer: Self-pay | Admitting: Obstetrics and Gynecology

## 2016-08-05 ENCOUNTER — Encounter: Payer: Self-pay | Admitting: Physician Assistant

## 2016-08-05 ENCOUNTER — Ambulatory Visit (INDEPENDENT_AMBULATORY_CARE_PROVIDER_SITE_OTHER): Payer: Commercial Managed Care - PPO | Admitting: Physician Assistant

## 2016-08-05 VITALS — BP 102/72 | HR 76 | Temp 98.5°F | Resp 16 | Ht 62.0 in | Wt 110.0 lb

## 2016-08-05 DIAGNOSIS — N926 Irregular menstruation, unspecified: Secondary | ICD-10-CM | POA: Diagnosis not present

## 2016-08-05 DIAGNOSIS — Z0000 Encounter for general adult medical examination without abnormal findings: Secondary | ICD-10-CM

## 2016-08-05 LAB — POCT URINE PREGNANCY: Preg Test, Ur: NEGATIVE

## 2016-08-05 NOTE — Progress Notes (Signed)
Patient: Carolyn Johnston Female    DOB: February 07, 1981   36 y.o.   MRN: [id number] Visit Date: 08/05/2016  Today's Provider: Trey Sailors, PA-C   Chief Complaint  Patient presents with  . Establish Care  . Annual Exam   Subjective:    HPI    Carolyn Johnston is a 36 y/o woman presenting today to re-establish care. Used to see Liane Comber until her pregnancy and HPV positive when she began to see OBGYN for this.  She has history of migraines that have tapered off over the past year. She has tried Imitrex, sphenopalatine block, magnesium, butterbur all without success. She was referred to pain management with negative findings and decided not to pursue any procedures.  She has two children ages 37 and 48. Married 10 years. Sexually active with husband. History of genital herpes, takes once daily valtrex for suppressive therapy.   She does not have family history of breast or colon cancer.   She had an IUD removed last year and gets previfem from her GYN.   On Tazorac and aldactone from her dermatologist for acne.   Had some spotty menstrual cycle recently. Doesn't miss pill. Requests pregnancy test.     No Known Allergies   Current Outpatient Prescriptions:  .  Ibuprofen (ADVIL MIGRAINE) 200 MG CAPS, Take 2 capsules by mouth as needed., Disp: , Rfl:  .  ketoconazole (NIZORAL) 2 % shampoo, , Disp: , Rfl:  .  norgestimate-ethinyl estradiol (PREVIFEM) 0.25-35 MG-MCG tablet, , Disp: , Rfl:  .  spironolactone (ALDACTONE) 25 MG tablet, Take by mouth., Disp: , Rfl:  .  tazarotene (TAZORAC) 0.1 % gel, Apply topically., Disp: , Rfl:  .  valACYclovir (VALTREX) 1000 MG tablet, Take 1 tablet (1,000 mg total) by mouth daily., Disp: 30 tablet, Rfl: 2  Review of Systems  Social History  Substance Use Topics  . Smoking status: Former Smoker    Quit date: 06/29/2008  . Smokeless tobacco: Never Used  . Alcohol use No   Objective:   BP 102/72 (BP Location: Left Arm, Patient  Position: Sitting, Cuff Size: Normal)   Pulse 76   Temp 98.5 F (36.9 C) (Oral)   Resp 16   Ht 5\' 2"  (1.575 m)   Wt 110 lb (49.9 kg)   LMP 07/28/2016   BMI 20.12 kg/m  Vitals:   08/05/16 1517  BP: 102/72  Pulse: 76  Resp: 16  Temp: 98.5 F (36.9 C)  TempSrc: Oral  Weight: 110 lb (49.9 kg)  Height: 5\' 2"  (1.575 m)     Physical Exam  Constitutional: She is oriented to person, place, and time. She appears well-developed and well-nourished.  HENT:  Right Ear: Tympanic membrane and external ear normal.  Left Ear: Tympanic membrane and external ear normal.  Mouth/Throat: Oropharynx is clear and moist. No oropharyngeal exudate.  Neck: Neck supple. No thyromegaly present.  Cardiovascular: Normal rate and regular rhythm.   Pulmonary/Chest: Effort normal and breath sounds normal.  Abdominal: Soft. Bowel sounds are normal.  Lymphadenopathy:    She has no cervical adenopathy.  Neurological: She is alert and oriented to person, place, and time.  Skin: Skin is warm and dry.  Psychiatric: She has a normal mood and affect. Her behavior is normal.        Assessment & Plan:     1. Annual physical exam  Had labs done at work. Slightly elevated cholesterol with normal fasting blood sugar. Declines further  labwork but for TSH.  - TSH  2. Menstrual irregularity  Urine preg negative.   Return in about 1 year (around 08/05/2017) for CPE.  The entirety of the information documented in the History of Present Illness, Review of Systems and Physical Exam were personally obtained by me. Portions of this information were initially documented by Kavin LeechLaura Walsh, CMA and reviewed by me for thoroughness and accuracy.         Trey SailorsAdriana M Cleofas Hudgins, PA-C  [facility]

## 2016-08-05 NOTE — Patient Instructions (Signed)

## 2016-08-06 ENCOUNTER — Ambulatory Visit (INDEPENDENT_AMBULATORY_CARE_PROVIDER_SITE_OTHER): Payer: Commercial Managed Care - PPO | Admitting: Obstetrics and Gynecology

## 2016-08-06 ENCOUNTER — Encounter: Payer: Self-pay | Admitting: Obstetrics and Gynecology

## 2016-08-06 ENCOUNTER — Encounter: Payer: Self-pay | Admitting: Physician Assistant

## 2016-08-06 VITALS — BP 112/56 | HR 85 | Ht 62.0 in | Wt 107.0 lb

## 2016-08-06 DIAGNOSIS — Z01419 Encounter for gynecological examination (general) (routine) without abnormal findings: Secondary | ICD-10-CM

## 2016-08-06 LAB — TSH: TSH: 1.08 u[IU]/mL (ref 0.450–4.500)

## 2016-08-06 MED ORDER — VALACYCLOVIR HCL 1 G PO TABS: 1000.0000 mg | ORAL_TABLET | Freq: Every day | ORAL | 3 refills | Status: DC

## 2016-08-06 MED ORDER — NORGESTIMATE-ETH ESTRADIOL 0.25-35 MG-MCG PO TABS: 1.0000 | ORAL_TABLET | Freq: Every day | ORAL | 3 refills | Status: DC

## 2016-08-06 NOTE — Patient Instructions (Signed)
Preventive Care 18-39 Years, Female Preventive care refers to lifestyle choices and visits with your health care provider that can promote health and wellness. What does preventive care include?  A yearly physical exam. This is also called an annual well check.  Dental exams once or twice a year.  Routine eye exams. Ask your health care provider how often you should have your eyes checked.  Personal lifestyle choices, including: ? Daily care of your teeth and gums. ? Regular physical activity. ? Eating a healthy diet. ? Avoiding tobacco and drug use. ? Limiting alcohol use. ? Practicing safe sex. ? Taking vitamin and mineral supplements as recommended by your health care provider. What happens during an annual well check? The services and screenings done by your health care provider during your annual well check will depend on your age, overall health, lifestyle risk factors, and family history of disease. Counseling Your health care provider may ask you questions about your:  Alcohol use.  Tobacco use.  Drug use.  Emotional well-being.  Home and relationship well-being.  Sexual activity.  Eating habits.  Work and work Statistician.  Method of birth control.  Menstrual cycle.  Pregnancy history.  Screening You may have the following tests or measurements:  Height, weight, and BMI.  Diabetes screening. This is done by checking your blood sugar (glucose) after you have not eaten for a while (fasting).  Blood pressure.  Lipid and cholesterol levels. These may be checked every 5 years starting at age 66.  Skin check.  Hepatitis C blood test.  Hepatitis B blood test.  Sexually transmitted disease (STD) testing.  BRCA-related cancer screening. This may be done if you have a family history of breast, ovarian, tubal, or peritoneal cancers.  Pelvic exam and Pap test. This may be done every 3 years starting at age 40. Starting at age 59, this may be done every 5  years if you have a Pap test in combination with an HPV test.  Discuss your test results, treatment options, and if necessary, the need for more tests with your health care provider. Vaccines Your health care provider may recommend certain vaccines, such as:  Influenza vaccine. This is recommended every year.  Tetanus, diphtheria, and acellular pertussis (Tdap, Td) vaccine. You may need a Td booster every 10 years.  Varicella vaccine. You may need this if you have not been vaccinated.  HPV vaccine. If you are 69 or younger, you may need three doses over 6 months.  Measles, mumps, and rubella (MMR) vaccine. You may need at least one dose of MMR. You may also need a second dose.  Pneumococcal 13-valent conjugate (PCV13) vaccine. You may need this if you have certain conditions and were not previously vaccinated.  Pneumococcal polysaccharide (PPSV23) vaccine. You may need one or two doses if you smoke cigarettes or if you have certain conditions.  Meningococcal vaccine. One dose is recommended if you are age 27-21 years and a first-year college student living in a residence hall, or if you have one of several medical conditions. You may also need additional booster doses.  Hepatitis A vaccine. You may need this if you have certain conditions or if you travel or work in places where you may be exposed to hepatitis A.  Hepatitis B vaccine. You may need this if you have certain conditions or if you travel or work in places where you may be exposed to hepatitis B.  Haemophilus influenzae type b (Hib) vaccine. You may need this if  you have certain risk factors.  Talk to your health care provider about which screenings and vaccines you need and how often you need them. This information is not intended to replace advice given to you by your health care provider. Make sure you discuss any questions you have with your health care provider. Document Released: 04/02/2001 Document Revised: 10/25/2015  Document Reviewed: 12/06/2014 Elsevier Interactive Patient Education  2017 [employer].

## 2016-08-06 NOTE — Progress Notes (Signed)
Patient ID: Carolyn Johnston, female   DOB: June 15, 1980, 36 y.o.   MRN: [id number]     Gynecology Annual Exam  PCP: Trey Sailors, PA-C  Chief Complaint:  Chief Complaint  Patient presents with  . Gynecologic Exam    History of Present Illness: Patient is a 36 y.o. G3P0010 presents for annual exam. The patient has no complaints today.   LMP: Patient's last menstrual period was 07/28/2016. Average Interval: regular, 28 days Duration of flow: 4 days Heavy Menses: no Clots: no Intermenstrual Bleeding: no Postcoital Bleeding: no Dysmenorrhea: no  The patient is sexually active. She currently uses oral progesterone-only contraceptive for contraception. She denies dyspareunia.  The patient does perform self breast exams.  There is no notable family history of breast or ovarian cancer in her family.  The patient wears seatbelts: yes.   The patient has regular exercise: not asked.    The patient denies current symptoms of depression.    Review of Systems: ROS  Past Medical History:  Past Medical History:  Diagnosis Date  . Allergy    trees, dust mites  . Migraines     Past Surgical History:  Past Surgical History:  Procedure Laterality Date  . adnoids suture    . DILATION AND CURETTAGE OF UTERUS    . TONSILLECTOMY    . WISDOM TOOTH EXTRACTION      Gynecologic History:  Patient's last menstrual period was 07/28/2016. Contraception: OCP (estrogen/progesterone) Last Pap: Results were: NIL and HR HPV negative 03/30/2014  Obstetric History: G3P0010  Family History:  Family History  Problem Relation Age of Onset  . Arthritis Mother   . Headache Mother   . Fibroids Mother   . Drug abuse Father   . Diabetes Maternal Grandmother   . Parkinson's disease Maternal Grandfather     Social History:  Social History   Social History  . Marital status: Single    Spouse name: N/A  . Number of children: N/A  . Years of education: N/A   Occupational History  . Not on  file.   Social History Main Topics  . Smoking status: Former Smoker    Quit date: 06/29/2008  . Smokeless tobacco: Never Used  . Alcohol use No  . Drug use: No  . Sexual activity: Yes    Birth control/ protection: IUD   Other Topics Concern  . Not on file   Social History Narrative  . No narrative on file    Allergies:  No Known Allergies  Medications: Prior to Admission medications   Medication Sig Start Date End Date Taking? Authorizing Provider  ketoconazole (NIZORAL) 2 % shampoo  06/27/16  Yes [provider]  norgestimate-ethinyl estradiol (PREVIFEM) 0.25-35 MG-MCG tablet  09/18/15  Yes [provider]  spironolactone (ALDACTONE) 25 MG tablet Take by mouth.   Yes [provider]  tazarotene (TAZORAC) 0.1 % gel Apply topically.   Yes [provider]  valACYclovir (VALTREX) 1000 MG tablet Take 1 tablet (1,000 mg total) by mouth daily. 07/04/16  Yes Vena Austria, MD    Physical Exam Vitals: Blood pressure (!) 112/56, pulse 85, height 5\' 2"  (1.575 m), weight 107 lb (48.5 kg), last menstrual period 07/28/2016.  General: NAD HEENT: normocephalic, anicteric Thyroid: no enlargement, no palpable nodules Pulmonary: No increased work of breathing, CTAB Cardiovascular: RRR, distal pulses 2+ Breast: Breast symmetrical, no tenderness, no palpable nodules or masses, no skin or nipple retraction present, no nipple discharge.  No axillary or supraclavicular lymphadenopathy. Abdomen:  NABS, soft, non-tender, non-distended.  Umbilicus without lesions.  No hepatomegaly, splenomegaly or masses palpable. No evidence of hernia  Genitourinary:  External: Normal external female genitalia.  Normal urethral meatus, normal  Bartholin's and Skene's glands.    Vagina: Normal vaginal mucosa, no evidence of prolapse.    Cervix: Grossly normal in appearance, no bleeding  Uterus: Non-enlarged, mobile, normal contour.  No CMT  Adnexa: ovaries non-enlarged, no  adnexal masses  Rectal: deferred  Lymphatic: no evidence of inguinal lymphadenopathy Extremities: no edema, erythema, or tenderness Neurologic: Grossly intact Psychiatric: mood appropriate, affect full  Female chaperone present for pelvic and breast  portions of the physical exam    Assessment: 36 y.o. G3P0010 No problem-specific Assessment & Plan notes found for this encounter.   Plan: Problem List Items Addressed This Visit    None    Visit Diagnoses    Encounter for gynecological examination without abnormal finding    -  Primary      1) STI screening was offered and declined  2) ASCCP guidelines and rational discussed.  Patient opts for every 3 years screening interval  3) Contraception - Education given regarding options for contraception, including oral contraceptives.  4) Routine healthcare maintenance including cholesterol, diabetes screening discussed managed by PCP  5) Follow up 1 year for routine annual exam

## 2016-08-06 NOTE — Progress Notes (Signed)
Advised  ED 

## 2016-09-18 ENCOUNTER — Encounter: Payer: Self-pay | Admitting: Obstetrics and Gynecology

## 2016-09-18 ENCOUNTER — Other Ambulatory Visit: Payer: Self-pay | Admitting: Obstetrics and Gynecology

## 2016-09-18 MED ORDER — NORGESTIMATE-ETH ESTRADIOL 0.25-35 MG-MCG PO TABS: 1.0000 | ORAL_TABLET | Freq: Every day | ORAL | 3 refills | Status: DC

## 2016-11-21 ENCOUNTER — Encounter: Payer: Self-pay | Admitting: Physician Assistant

## 2016-11-21 ENCOUNTER — Ambulatory Visit (INDEPENDENT_AMBULATORY_CARE_PROVIDER_SITE_OTHER): Payer: Commercial Managed Care - PPO | Admitting: Physician Assistant

## 2016-11-21 VITALS — BP 122/60 | HR 88 | Temp 97.9°F | Resp 16 | Wt 105.0 lb

## 2016-11-21 DIAGNOSIS — B9689 Other specified bacterial agents as the cause of diseases classified elsewhere: Secondary | ICD-10-CM

## 2016-11-21 DIAGNOSIS — Z23 Encounter for immunization: Secondary | ICD-10-CM | POA: Diagnosis not present

## 2016-11-21 DIAGNOSIS — N760 Acute vaginitis: Secondary | ICD-10-CM

## 2016-11-21 DIAGNOSIS — N898 Other specified noninflammatory disorders of vagina: Secondary | ICD-10-CM

## 2016-11-21 LAB — POCT WET PREP (WET MOUNT): Trichomonas Wet Prep HPF POC: ABSENT

## 2016-11-21 MED ORDER — METRONIDAZOLE 0.75 % VA GEL: 1.0000 | Freq: Every day | VAGINAL | 0 refills | Status: AC

## 2016-11-21 NOTE — Patient Instructions (Signed)

## 2016-11-21 NOTE — Progress Notes (Signed)
Patient: Carolyn Johnston Female    DOB: 1980-07-13   36 y.o.   MRN: [id number] Visit Date: 11/21/2016  Today's Provider: Trey Sailors, PA-C   Chief Complaint  Patient presents with  . Vaginal Atrophy   Subjective:     Carolyn Johnston is a 36 y/o woman presenting with vaginal odor and discharge for three months. She says she notices it mostly around her periods. She is sexually active. Does not have concern for STI. No urinary tract symptoms. No fevers, pelvic pain. Has had BV in the past, says it is similar to that. Also requesting flu shot today.  Vaginal Discharge  The patient's primary symptoms include genital itching, a genital odor and vaginal discharge. The patient's pertinent negatives include no genital lesions, genital rash, missed menses, pelvic pain or vaginal bleeding. This is a new problem. The current episode started more than 1 month ago (Started about 3 months ago). The problem has been unchanged. The patient is experiencing no pain. Pertinent negatives include no abdominal pain, constipation, diarrhea, discolored urine, dysuria, fever, flank pain, frequency, headaches, hematuria or urgency. The vaginal discharge was normal. There has been no bleeding. She has not been passing clots. She has not been passing tissue. She uses oral contraceptives for contraception. Her menstrual history has been regular.       No Known Allergies   Current Outpatient Prescriptions:  .  ketoconazole (NIZORAL) 2 % shampoo, , Disp: , Rfl:  .  norgestimate-ethinyl estradiol (PREVIFEM) 0.25-35 MG-MCG tablet, Take 1 tablet by mouth daily., Disp: 3 Package, Rfl: 3 .  spironolactone (ALDACTONE) 25 MG tablet, Take by mouth., Disp: , Rfl:  .  tazarotene (TAZORAC) 0.1 % gel, Apply topically., Disp: , Rfl:  .  valACYclovir (VALTREX) 1000 MG tablet, Take 1 tablet (1,000 mg total) by mouth daily., Disp: 90 tablet, Rfl: 3  Review of Systems  Constitutional: Negative.  Negative for fever.    Gastrointestinal: Negative for abdominal pain, constipation and diarrhea.  Genitourinary: Positive for vaginal discharge. Negative for decreased urine volume, difficulty urinating, dyspareunia, dysuria, enuresis, flank pain, frequency, genital sores, hematuria, menstrual problem, missed menses, pelvic pain, urgency, vaginal bleeding and vaginal pain.  Neurological: Negative for dizziness, light-headedness and headaches.    Social History  Substance Use Topics  . Smoking status: Former Smoker    Quit date: 06/29/2008  . Smokeless tobacco: Never Used  . Alcohol use No   Objective:   BP 122/60 (BP Location: Left Arm, Patient Position: Sitting, Cuff Size: Normal)   Pulse 88   Temp 97.9 F (36.6 C) (Oral)   Resp 16   Wt 105 lb (47.6 kg)   LMP 11/12/2016   BMI 19.20 kg/m  Vitals:   11/21/16 1559  BP: 122/60  Pulse: 88  Resp: 16  Temp: 97.9 F (36.6 C)  TempSrc: Oral  Weight: 105 lb (47.6 kg)     Physical Exam  Constitutional: She is oriented to person, place, and time. She appears well-developed and well-nourished.  Genitourinary: Cervix exhibits discharge. Cervix exhibits no motion tenderness and no friability. No erythema, tenderness or bleeding in the vagina. No signs of injury around the vagina. No vaginal discharge found.  Genitourinary Comments: Some offwhite cervical discharge, not clumped.   Neurological: She is alert and oriented to person, place, and time.  Skin: Skin is warm and dry.  Psychiatric: She has a normal mood and affect. Her behavior is normal.  Assessment & Plan:     1. Vaginal odor2  Clue cells on wet prep. No hyphae visible with KOH. Will treat with metronidazole gel.   - POCT Wet Prep [employer])  2. Vaginal discharge  - POCT Wet Prep [facility])  3. Bacterial vaginosis  - metroNIDAZOLE (METROGEL) 0.75 % vaginal gel; Place 1 Applicatorful vaginally at bedtime.  Dispense: 70 g; Refill: 0  4. Influenza vaccine needed  Given  today.  Return if symptoms worsen or fail to improve.  The entirety of the information documented in the History of Present Illness, Review of Systems and Physical Exam were personally obtained by me. Portions of this information were initially documented by Kavin Leech, CMA and reviewed by me for thoroughness and accuracy.          Trey Sailors, PA-C  [facility]

## 2016-11-22 DIAGNOSIS — Z23 Encounter for immunization: Secondary | ICD-10-CM | POA: Diagnosis not present

## 2016-11-22 NOTE — Addendum Note (Signed)
Addended by: Kavin Leech E on: 11/22/2016 08:49 AM   Modules accepted: Orders

## 2016-12-16 ENCOUNTER — Encounter: Payer: Self-pay | Admitting: Obstetrics and Gynecology

## 2016-12-17 ENCOUNTER — Other Ambulatory Visit: Payer: Self-pay | Admitting: Obstetrics and Gynecology

## 2016-12-17 MED ORDER — NORGESTIMATE-ETH ESTRADIOL 0.25-35 MG-MCG PO TABS: 1.0000 | ORAL_TABLET | Freq: Every day | ORAL | 3 refills | Status: DC

## 2016-12-17 MED ORDER — VALACYCLOVIR HCL 1 G PO TABS: 1000.0000 mg | ORAL_TABLET | Freq: Every day | ORAL | 3 refills | Status: DC

## 2017-01-06 ENCOUNTER — Encounter: Payer: Self-pay | Admitting: Physician Assistant

## 2017-01-07 ENCOUNTER — Ambulatory Visit: Payer: Commercial Managed Care - PPO | Admitting: Physician Assistant

## 2017-01-07 ENCOUNTER — Encounter: Payer: Self-pay | Admitting: Physician Assistant

## 2017-01-07 VITALS — BP 110/70 | HR 74 | Resp 16 | Wt 110.0 lb

## 2017-01-07 DIAGNOSIS — N3000 Acute cystitis without hematuria: Secondary | ICD-10-CM | POA: Diagnosis not present

## 2017-01-07 LAB — POCT URINALYSIS DIPSTICK
Bilirubin, UA: NEGATIVE
Glucose, UA: NEGATIVE
Ketones, UA: NEGATIVE
Nitrite, UA: NEGATIVE
Protein, UA: NEGATIVE
Spec Grav, UA: 1.015 (ref 1.010–1.025)
Urobilinogen, UA: 0.2 E.U./dL
pH, UA: 8.5 — AB (ref 5.0–8.0)

## 2017-01-07 MED ORDER — SULFAMETHOXAZOLE-TRIMETHOPRIM 800-160 MG PO TABS: 1.0000 | ORAL_TABLET | Freq: Two times a day (BID) | ORAL | 0 refills | Status: AC

## 2017-01-07 NOTE — Patient Instructions (Signed)

## 2017-01-07 NOTE — Progress Notes (Signed)
Patient: Carolyn Johnston Female    DOB: August 31, 1980   36 y.o.   MRN: [id number] Visit Date: 01/08/2017  Today's Provider: Trey SailorsAdriana M Rox Mcgriff, PA-C   Chief Complaint  Patient presents with  . Urinary Tract Infection   Subjective:    Carolyn Johnston is a 36 y/o woman presenting with urinary frequency and dysuria x 4 days. Pertinents below. Denies vaginal discharge or lesions. Took AZO tabs for some relief.   Urinary Tract Infection   This is a new problem. The current episode started in the past 7 days (about 4 days). The problem has been gradually worsening. The quality of the pain is described as aching and burning. There has been no fever. Associated symptoms include flank pain and frequency. Pertinent negatives include no chills, discharge, hematuria or urgency. She has tried increased fluids (and AZO) for the symptoms. The treatment provided mild relief.       No Known Allergies   Current Outpatient Medications:  .  norgestimate-ethinyl estradiol (PREVIFEM) 0.25-35 MG-MCG tablet, Take 1 tablet by mouth daily., Disp: 3 Package, Rfl: 3 .  spironolactone (ALDACTONE) 25 MG tablet, Take by mouth., Disp: , Rfl:  .  tazarotene (TAZORAC) 0.1 % gel, Apply topically., Disp: , Rfl:  .  valACYclovir (VALTREX) 1000 MG tablet, Take 1 tablet (1,000 mg total) by mouth daily., Disp: 90 tablet, Rfl: 3 .  ketoconazole (NIZORAL) 2 % shampoo, , Disp: , Rfl:  .  sulfamethoxazole-trimethoprim (BACTRIM DS,SEPTRA DS) 800-160 MG tablet, Take 1 tablet by mouth 2 (two) times daily for 3 days., Disp: 6 tablet, Rfl: 0  Review of Systems  Constitutional: Negative for activity change, chills, fatigue and fever.  Genitourinary: Positive for dysuria, flank pain and frequency. Negative for decreased urine volume, hematuria, pelvic pain, urgency, vaginal bleeding and vaginal discharge.    Social History   Tobacco Use  . Smoking status: Former Smoker    Last attempt to quit: 06/29/2008    Years since  quitting: 8.5  . Smokeless tobacco: Never Used  Substance Use Topics  . Alcohol use: No   Objective:   BP 110/70 (BP Location: Left Arm, Patient Position: Sitting, Cuff Size: Normal)   Pulse 74   Resp 16   Wt 110 lb (49.9 kg)   SpO2 99%   BMI 20.12 kg/m  Vitals:   01/07/17 1618  BP: 110/70  Pulse: 74  Resp: 16  SpO2: 99%  Weight: 110 lb (49.9 kg)     Physical Exam  Constitutional: She is oriented to person, place, and time. She appears well-developed and well-nourished. No distress.  Cardiovascular: Normal rate and regular rhythm.  Pulmonary/Chest: Effort normal and breath sounds normal.  Abdominal: Soft. Bowel sounds are normal. She exhibits no distension. There is tenderness in the suprapubic area. There is no rebound, no guarding and no CVA tenderness.  Neurological: She is alert and oriented to person, place, and time.  Skin: Skin is warm and dry. She is not diaphoretic.  Psychiatric: She has a normal mood and affect. Her behavior is normal.        Assessment & Plan:     1. Acute cystitis without hematuria  Urinalysis shows some possible blood and leukocytes, urine stained from azo. Will send for culture and begin empiric tx. Counseled on return precautions.   - POCT urinalysis dipstick - Urine Culture - sulfamethoxazole-trimethoprim (BACTRIM DS,SEPTRA DS) 800-160 MG tablet; Take 1 tablet by mouth 2 (two) times daily for 3  days.  Dispense: 6 tablet; Refill: 0  Return if symptoms worsen or fail to improve.  The entirety of the information documented in the History of Present Illness, Review of Systems and Physical Exam were personally obtained by me. Portions of this information were initially documented by EritreaBrittany O'Dell, CMA and reviewed by me for thoroughness and accuracy.           Trey SailorsAdriana M Iyona Pehrson, PA-C  [facility]

## 2017-01-08 LAB — URINE CULTURE
MICRO NUMBER:: 81308991
Result:: NO GROWTH
SPECIMEN QUALITY:: ADEQUATE

## 2017-01-14 ENCOUNTER — Telehealth: Payer: Self-pay

## 2017-01-14 NOTE — Telephone Encounter (Signed)
Pt advised of Urine culture results.   Thanks,   -Vernona RiegerLaura

## 2017-05-21 ENCOUNTER — Encounter: Payer: Self-pay | Admitting: Physician Assistant

## 2017-05-21 ENCOUNTER — Ambulatory Visit: Payer: Commercial Managed Care - PPO | Admitting: Physician Assistant

## 2017-05-21 ENCOUNTER — Ambulatory Visit
Admission: RE | Admit: 2017-05-21 | Discharge: 2017-05-21 | Disposition: A | Discharge status: Home / Self Care | Payer: Commercial Managed Care - PPO | Source: Ambulatory Visit | Attending: Physician Assistant | Admitting: Physician Assistant

## 2017-05-21 ENCOUNTER — Other Ambulatory Visit: Payer: Self-pay | Admitting: Nurse Practitioner

## 2017-05-21 VITALS — BP 102/68 | HR 100 | Temp 98.8°F | Wt 109.6 lb

## 2017-05-21 DIAGNOSIS — G4453 Primary thunderclap headache: Secondary | ICD-10-CM

## 2017-05-21 DIAGNOSIS — M79605 Pain in left leg: Secondary | ICD-10-CM | POA: Insufficient documentation

## 2017-05-21 NOTE — Progress Notes (Signed)
Patient: Carolyn Melnickricila N Bugay Female    DOB: 02-26-1980   36 y.o.   MRN: [id number] Visit Date: 05/21/2017  Today's Provider: Trey SailorsAdriana M Pollak, PA-C   Chief Complaint  Patient presents with  . Leg Pain  . Migraine   Subjective:    Carolyn Johnston is a 37 y/o woman presenting today with left leg pain ongoing for one day, concern for DVT. She describes pain in her left medical lower leg. It started suddenly when she stepped up yesterday and has been worsening. She describes the pain as being in her vein. There is no redness, no swelling of the leg. She does not have active cancer, is not bedridden, no recent surgeries. She does take combo OCP. She has recently visited neurology for migraines who expressed concern for possible brain aneurysm and she is concerned about the remainder of her vasculature. She is not having any chest pain or trouble breathing.  Leg Pain   Incident onset: yesterday afternoon. There was no injury mechanism. The pain is present in the left leg. Quality: sharp and cramping. The pain has been constant since onset. She reports no foreign bodies present. The symptoms are aggravated by movement. She has tried nothing for the symptoms.      No Known Allergies   Current Outpatient Medications:  .  Fremanezumab-vfrm (AJOVY) 225 MG/1.5ML SOSY, Inject into the skin., Disp: , Rfl:  .  ketoconazole (NIZORAL) 2 % shampoo, , Disp: , Rfl:  .  ketorolac (TORADOL) 10 MG tablet, Take by mouth., Disp: , Rfl:  .  norgestimate-ethinyl estradiol (PREVIFEM) 0.25-35 MG-MCG tablet, Take 1 tablet by mouth daily., Disp: 3 Package, Rfl: 3 .  predniSONE (DELTASONE) 10 MG tablet, Take 6 tabs x1 day, then 5 tabs x 1 day, then 4 tabs x1 day, then 3 tabs x1 day, then 2 tabs x 1 day, then 1 tab x 1 day, then stop., Disp: , Rfl:  .  promethazine (PHENERGAN) 25 MG tablet, Take by mouth., Disp: , Rfl:  .  spironolactone (ALDACTONE) 25 MG tablet, Take by mouth., Disp: , Rfl:  .  tazarotene  (TAZORAC) 0.1 % gel, Apply topically., Disp: , Rfl:  .  valACYclovir (VALTREX) 1000 MG tablet, Take 1 tablet (1,000 mg total) by mouth daily., Disp: 90 tablet, Rfl: 3   Review of Systems  Constitutional: Negative.   Respiratory: Negative.   Cardiovascular: Negative.   Musculoskeletal:       Left leg pain   Neurological: Positive for headaches. Light-headedness: migraine.   Family History  Problem Relation Age of Onset  . Arthritis Mother   . Headache Mother   . Fibroids Mother   . Drug abuse Father   . Diabetes Maternal Grandmother   . Parkinson's disease Maternal Grandfather    Past Surgical History:  Procedure Laterality Date  . adnoids suture    . DILATION AND CURETTAGE OF UTERUS    . TONSILLECTOMY    . WISDOM TOOTH EXTRACTION      Social History   Tobacco Use  . Smoking status: Former Smoker    Last attempt to quit: 06/29/2008    Years since quitting: 8.8  . Smokeless tobacco: Never Used  Substance Use Topics  . Alcohol use: No   Objective:   BP 102/68 (BP Location: Right Arm, Patient Position: Sitting, Cuff Size: Normal)   Pulse 100   Temp 98.8 F (37.1 C) (Oral)   Wt 109 lb 9.6 oz (49.7 kg)  SpO2 99%   BMI 20.05 kg/m    Physical Exam  Constitutional: She is oriented to person, place, and time. She appears well-developed and well-nourished.  Cardiovascular: Normal rate and regular rhythm.  Pulses:      Dorsalis pedis pulses are 2+ on the right side, and 2+ on the left side.       Posterior tibial pulses are 2+ on the right side, and 2+ on the left side.  Pulmonary/Chest: Effort normal and breath sounds normal.  Musculoskeletal: She exhibits tenderness. She exhibits no edema.  There is tenderness of her medial left leg just inferior to the popliteal fossa. There is no pain of the Deep venous system. There is no erythema of the leg or swelling. Both legs are equal size.   Neurological: She is alert and oriented to person, place, and time.  Skin: Skin is  warm and dry.  Psychiatric: She has a normal mood and affect. Her behavior is normal.        Assessment & Plan:     1. Left leg pain  I have a low suspicion for DVT, but patient remains concerned. Will get ultrasound.   - US Venous Img Lower Unilateral Left; Future  Return if symptoms worsen or fail to improve.  The entirety of the information documented in the History of Present Illness, Review of Systems and Physical Exam were personally obtained by me. Portions of this information were initially documented by Presley Raddle, CMA and reviewed by me for thoroughness and accuracy.           Trey Sailors, PA-C  [facility]

## 2017-05-21 NOTE — Patient Instructions (Signed)
Deep Vein Thrombosis Deep vein thrombosis (DVT) is a condition in which a blood clot forms in a deep vein, such as a lower leg, thigh, or arm vein. A clot is blood that has thickened into a gel or solid. This condition is dangerous. It can lead to serious and even life-threatening complications if the clot travels to the lungs and causes a blockage (pulmonary embolism). It can also damage veins in the leg. This can result in leg pain, swelling, discoloration, and sores (post-thrombotic syndrome). What are the causes? This condition may be caused by:  A slowdown of blood flow.  Damage to a vein.  A condition that makes blood clot more easily.  What increases the risk? The following factors may make you more likely to develop this condition:  Being overweight.  Being elderly, especially over age 60.  Sitting or lying down for more than four hours.  Lack of physical activity (sedentary lifestyle).  Being pregnant, giving birth, or having recently given birth.  Taking medicines that contain estrogen.  Smoking.  A history of any of the following: ? Blood clots or blood clotting disease. ? Peripheral vascular disease. ? Inflammatory bowel disease. ? Cancer. ? Heart disease. ? Genetic conditions that affect how blood clots. ? Neurological diseases that affect the legs (leg paresis). ? Injury. ? Major or lengthy surgery. ? A central line placed inside a large vein.  What are the signs or symptoms? Symptoms of this condition include:  Swelling, pain, or tenderness in an arm or leg.  Warmth, redness, or discoloration in an arm or leg.  If the clot is in your leg, symptoms may be more noticeable or worse when you stand or walk. Some people do not have any symptoms. How is this diagnosed? This condition is diagnosed with:  A medical history.  A physical exam.  Tests, such as: ? Blood tests. These are done to see how your blood clots. ? Imaging tests. These are done to  check for clots. Tests may include:  Ultrasound.  CT scan.  MRI.  X-ray.  Venogram. For this test, X-rays are taken after a dye is injected into a vein.  How is this treated? Treatment for this condition depends on the cause, your risk for bleeding or developing more clots, and any medical conditions you have. Treatment may include:  Taking blood thinners (also called anticoagulants). These medicines may be taken by mouth, injected under the skin, or injected through an IV tube (catheter). These medicines prevent clots from forming.  Injecting medicine that dissolves blood clots into the affected vein (catheter-directed thrombolysis).  Having surgery. Surgery may be done to: ? Remove the clot. ? Place a filter in a large vein to catch blood clots before they reach the lungs.  Some treatments may be continued for up to six months. Follow these instructions at home: If you are taking an oral blood thinner:  Take the medicine exactly as told by your health care provider. Some blood thinners need to be taken at the same time every day. Do not skip a dose.  Ask your health care provider about what foods and drugs interact with the medicine.  Ask about possible side effects. General instructions  Blood thinners can cause easy bruising and difficulty stopping bleeding. Because of this, if you are taking or were given a blood thinner: ? Hold pressure over cuts for longer than usual. ? Tell your dentist and other health care providers that you are taking blood thinners before   having any procedures that can cause bleeding. ? Avoid contact sports.  Take over-the-counter and prescription medicines only as told by your health care provider.  Return to your normal activities as told by your health care provider. Ask your health care provider what activities are safe for you.  Wear compression stockings if recommended by your health care provider.  Keep all follow-up visits as told by  your health care provider. This is important. How is this prevented? To lower your risk of developing this condition again:  For 30 or more minutes every day, do an activity that: ? Involves moving your arms and legs. ? Increases your heart rate.  When traveling for longer than four hours: ? Exercise your arms and legs every hour. ? Drink plenty of water. ? Avoid drinking alcohol.  Avoid sitting or lying for a long time without moving your legs.  Stay a healthy weight.  If you are a woman who is older than age 35, avoid unnecessary use of medicines that contain estrogen.  Do not use any products that contain nicotine or tobacco, such as cigarettes and e-cigarettes. This is especially important if you take estrogen medicines. If you need help quitting, ask your health care provider.  Contact a health care provider if:  You miss a dose of your blood thinner.  You have nausea, vomiting, or diarrhea that lasts for more than one day.  Your menstrual period is heavier than usual.  You have unusual bruising. Get help right away if:  You have new or increased pain, swelling, or redness in an arm or leg.  You have numbness or tingling in an arm or leg.  You have shortness of breath.  You have chest pain.  You have a rapid or irregular heartbeat.  You feel light-headed or dizzy.  You cough up blood.  There is blood in your vomit, stool, or urine.  You have a serious fall or accident, or you hit your head.  You have a severe headache or confusion.  You have a cut that will not stop bleeding. These symptoms may represent a serious problem that is an emergency. Do not wait to see if the symptoms will go away. Get medical help right away. Call your local emergency services (911 in the [place] Do not drive yourself to the hospital. Summary  DVT is a condition in which a blood clot forms in a deep vein, such as a lower leg, thigh, or arm vein.  Symptoms can include swelling,  warmth, pain, and redness in your leg or arm.  Treatment may include taking blood thinners, injecting medicine that dissolves blood clots,wearing compression stockings, or surgery.  If you are prescribed blood thinners, take them exactly as told. This information is not intended to replace advice given to you by your health care provider. Make sure you discuss any questions you have with your health care provider. Document Released: 02/04/2005 Document Revised: 03/09/2016 Document Reviewed: 03/09/2016 Elsevier Interactive Patient Education  2018 [employer]  

## 2017-05-27 ENCOUNTER — Ambulatory Visit
Admission: RE | Admit: 2017-05-27 | Discharge: 2017-05-27 | Disposition: A | Discharge status: Home / Self Care | Payer: Commercial Managed Care - PPO | Source: Ambulatory Visit | Attending: Nurse Practitioner | Admitting: Nurse Practitioner

## 2017-05-27 DIAGNOSIS — R930 Abnormal findings on diagnostic imaging of skull and head, not elsewhere classified: Secondary | ICD-10-CM | POA: Diagnosis not present

## 2017-05-27 DIAGNOSIS — G4453 Primary thunderclap headache: Secondary | ICD-10-CM | POA: Diagnosis present

## 2017-06-23 ENCOUNTER — Other Ambulatory Visit: Payer: Self-pay | Admitting: Neurological Surgery

## 2017-06-23 DIAGNOSIS — I671 Cerebral aneurysm, nonruptured: Secondary | ICD-10-CM

## 2017-06-30 ENCOUNTER — Ambulatory Visit
Admission: RE | Admit: 2017-06-30 | Discharge: 2017-06-30 | Disposition: A | Discharge status: Home / Self Care | Payer: Commercial Managed Care - PPO | Source: Ambulatory Visit | Attending: Neurological Surgery | Admitting: Neurological Surgery

## 2017-06-30 DIAGNOSIS — I671 Cerebral aneurysm, nonruptured: Secondary | ICD-10-CM

## 2017-06-30 MED ORDER — IOPAMIDOL (ISOVUE-370) INJECTION 76%
75.0000 mL | Freq: Once | INTRAVENOUS | Status: AC | PRN
  Administered 2017-06-30: 75 mL via INTRAVENOUS

## 2017-10-13 ENCOUNTER — Encounter: Payer: Self-pay | Admitting: Obstetrics and Gynecology

## 2017-10-13 ENCOUNTER — Ambulatory Visit: Payer: Commercial Managed Care - PPO | Admitting: Obstetrics and Gynecology

## 2017-10-13 ENCOUNTER — Ambulatory Visit (INDEPENDENT_AMBULATORY_CARE_PROVIDER_SITE_OTHER): Payer: Commercial Managed Care - PPO | Admitting: Obstetrics and Gynecology

## 2017-10-13 ENCOUNTER — Other Ambulatory Visit (HOSPITAL_COMMUNITY)
Admission: RE | Admit: 2017-10-13 | Discharge: 2017-10-13 | Disposition: A | Discharge status: Home / Self Care | Payer: Commercial Managed Care - PPO | Source: Ambulatory Visit | Attending: Obstetrics and Gynecology | Admitting: Obstetrics and Gynecology

## 2017-10-13 VITALS — BP 110/56 | HR 78 | Ht 62.0 in | Wt 110.0 lb

## 2017-10-13 DIAGNOSIS — Z1239 Encounter for other screening for malignant neoplasm of breast: Secondary | ICD-10-CM

## 2017-10-13 DIAGNOSIS — Z01419 Encounter for gynecological examination (general) (routine) without abnormal findings: Secondary | ICD-10-CM

## 2017-10-13 DIAGNOSIS — Z124 Encounter for screening for malignant neoplasm of cervix: Secondary | ICD-10-CM | POA: Diagnosis not present

## 2017-10-13 DIAGNOSIS — Z1231 Encounter for screening mammogram for malignant neoplasm of breast: Secondary | ICD-10-CM

## 2017-10-13 DIAGNOSIS — Z1151 Encounter for screening for human papillomavirus (HPV): Secondary | ICD-10-CM | POA: Insufficient documentation

## 2017-10-13 DIAGNOSIS — Z3041 Encounter for surveillance of contraceptive pills: Secondary | ICD-10-CM | POA: Diagnosis not present

## 2017-10-13 MED ORDER — NORGESTIMATE-ETH ESTRADIOL 0.25-35 MG-MCG PO TABS: 1.0000 | ORAL_TABLET | Freq: Every day | ORAL | 3 refills | Status: DC

## 2017-10-13 NOTE — Progress Notes (Signed)
Gynecology Annual Exam   PCP: Trey Sailors, PA-C  Chief Complaint:  Chief Complaint  Patient presents with  . Gynecologic Exam    discoloration at entry of vagina on right side    History of Present Illness: Patient is a 37 y.o. G3P0010 presents for annual exam. The patient has no complaints today.   LMP: Patient's last menstrual period was 09/07/2017 (exact date). Average Interval: regular, 28 days Duration of flow: 5 days Heavy Menses: no Clots: no Intermenstrual Bleeding: no Postcoital Bleeding: no Dysmenorrhea: no  The patient is sexually active. She currently uses OCP (estrogen/progesterone) for contraception. She reports occasional dyspareunia.  The patient does perform self breast exams.  There is notable family history of breast or ovarian cancer in her family.  The patient wears seatbelts: yes.   The patient has regular exercise: not asked.    The patient denies current symptoms of depression.    Review of Systems: Review of Systems  Constitutional: Negative for chills and fever.  HENT: Negative for congestion.   Respiratory: Negative for cough and shortness of breath.   Cardiovascular: Negative for chest pain and palpitations.  Gastrointestinal: Negative for abdominal pain, constipation, diarrhea, heartburn, nausea and vomiting.  Genitourinary: Negative for dysuria, frequency and urgency.  Skin: Negative for itching and rash.  Neurological: Negative for dizziness and headaches.  Endo/Heme/Allergies: Negative for polydipsia.  Psychiatric/Behavioral: Negative for depression.    Past Medical History:  Past Medical History:  Diagnosis Date  . Allergy    trees, dust mites  . Migraines     Past Surgical History:  Past Surgical History:  Procedure Laterality Date  . adnoids suture    . DILATION AND CURETTAGE OF UTERUS    . TONSILLECTOMY    . WISDOM TOOTH EXTRACTION      Gynecologic History:  Patient's last menstrual period was 09/07/2017  (exact date). Contraception: OCP (estrogen/progesterone) Last Pap: Results were: 03/30/2014 NIL and HR HPV negative   Obstetric History: G3P0010  Family History:  Family History  Problem Relation Age of Onset  . Arthritis Mother   . Headache Mother   . Fibroids Mother   . Drug abuse Father   . Diabetes Maternal Grandmother   . Parkinson's disease Maternal Grandfather     Social History:  Social History   Socioeconomic History  . Marital status: Single    Spouse name: Not on file  . Number of children: Not on file  . Years of education: Not on file  . Highest education level: Not on file  Occupational History  . Not on file  Social Needs  . Financial resource strain: Not on file  . Food insecurity:    Worry: Not on file    Inability: Not on file  . Transportation needs:    Medical: Not on file    Non-medical: Not on file  Tobacco Use  . Smoking status: Former Smoker    Last attempt to quit: 06/29/2008    Years since quitting: 9.2  . Smokeless tobacco: Never Used  Substance and Sexual Activity  . Alcohol use: No  . Drug use: No  . Sexual activity: Yes    Birth control/protection: IUD  Lifestyle  . Physical activity:    Days per week: Not on file    Minutes per session: Not on file  . Stress: Not on file  Relationships  . Social connections:    Talks on phone: Not on file    Gets together: Not  on file    Attends religious service: Not on file    Active member of club or organization: Not on file    Attends meetings of clubs or organizations: Not on file    Relationship status: Not on file  . Intimate partner violence:    Fear of current or ex partner: Not on file    Emotionally abused: Not on file    Physically abused: Not on file    Forced sexual activity: Not on file  Other Topics Concern  . Not on file  Social History Narrative  . Not on file    Allergies:  No Known Allergies  Medications: Prior to Admission medications   Medication Sig Start  Date End Date Taking? Authorizing Provider  Cholecalciferol (VITAMIN D-1000 MAX ST) 1000 units tablet Take by mouth.   Yes [provider]  EMGALITY 120 MG/ML SOSY INJECT 120 MG SUBCUTANEOUSLY EVERY 28 (TWENTY-EIGHT) DAYS 10/07/17  Yes [provider]  ibuprofen (ADVIL,MOTRIN) 200 MG tablet Take by mouth.   Yes [provider]  ketorolac (TORADOL) 10 MG tablet Take by mouth.   Yes [provider]  magnesium oxide (MAG-OX) 400 MG tablet Take by mouth.   Yes [provider]  norgestimate-ethinyl estradiol (PREVIFEM) 0.25-35 MG-MCG tablet Take 1 tablet by mouth daily. 12/17/16  Yes Vena AustriaStaebler, Tae Robak, MD  valACYclovir (VALTREX) 1000 MG tablet Take 1 tablet (1,000 mg total) by mouth daily. 12/17/16  Yes Vena AustriaStaebler, Laken Lobato, MD  Fremanezumab-vfrm (AJOVY) 225 MG/1.5ML SOSY Inject into the skin. 05/20/17   [provider]  ketoconazole (NIZORAL) 2 % shampoo  06/27/16   [provider]  predniSONE (DELTASONE) 10 MG tablet Take 6 tabs x1 day, then 5 tabs x 1 day, then 4 tabs x1 day, then 3 tabs x1 day, then 2 tabs x 1 day, then 1 tab x 1 day, then stop. 05/20/17   [provider]  promethazine (PHENERGAN) 25 MG tablet Take by mouth. 05/20/17 05/27/17  [provider]  spironolactone (ALDACTONE) 25 MG tablet Take by mouth.    [provider]  tazarotene (TAZORAC) 0.1 % gel Apply topically.    [provider]    Physical Exam Vitals: Blood pressure (!) 110/56, pulse 78, height 5\' 2"  (1.575 m), weight 110 lb (49.9 kg), last menstrual period 09/07/2017.  General: NAD HEENT: normocephalic, anicteric Thyroid: no enlargement, no palpable nodules Pulmonary: No increased work of breathing, CTAB Cardiovascular: RRR, distal pulses 2+ Breast: Breast symmetrical, no tenderness, no palpable nodules or masses, no skin or nipple retraction present, no nipple discharge.  No axillary or supraclavicular lymphadenopathy. Abdomen: NABS,  soft, non-tender, non-distended.  Umbilicus without lesions.  No hepatomegaly, splenomegaly or masses palpable. No evidence of hernia  Genitourinary:  External: Normal external female genitalia.  Normal urethral meatus, normal Bartholin's and Skene's glands.    Vagina: Normal vaginal mucosa, no evidence of prolapse.    Cervix: Grossly normal in appearance, no bleeding  Uterus: Non-enlarged, mobile, normal contour.  No CMT  Adnexa: ovaries non-enlarged, no adnexal masses  Rectal: deferred  Lymphatic: no evidence of inguinal lymphadenopathy Extremities: no edema, erythema, or tenderness Neurologic: Grossly intact Psychiatric: mood appropriate, affect full  Female chaperone present for pelvic and breast  portions of the physical exam    Assessment: 37 y.o. G3P0010 routine annual exam  Plan: Problem List Items Addressed This Visit    None    Visit Diagnoses    Encounter for gynecological examination without abnormal finding    -  Primary   Screening for malignant neoplasm of cervix       Relevant Orders   Cytology - PAP   Breast screening       Encounter for surveillance of contraceptive pills          2) STI screening  was notoffered and therefore not obtained  2)  ASCCP guidelines and rational discussed.  Patient opts for every 3 years screening interval  3) Contraception - the patient is currently using  OCP (estrogen/progesterone).  She is happy with her current form of contraception and plans to continue - changed to continuous OCP - previously on Mirena but had some discomfort with this  4) Routine healthcare maintenance including cholesterol, diabetes screening discussed managed by PCP  5) Return in about 1 year (around 10/14/2018) for annual.   Vena Austria, MD, [facility], [facility] 10/13/2017, 11:08 AM

## 2017-10-14 LAB — CYTOLOGY - PAP
Diagnosis: NEGATIVE
HPV (WINDOPATH): NOT DETECTED

## 2017-12-05 ENCOUNTER — Other Ambulatory Visit: Payer: Self-pay | Admitting: Obstetrics and Gynecology

## 2018-06-30 DIAGNOSIS — N3000 Acute cystitis without hematuria: Secondary | ICD-10-CM | POA: Diagnosis not present

## 2018-08-03 ENCOUNTER — Other Ambulatory Visit: Payer: Self-pay | Admitting: Obstetrics and Gynecology

## 2018-08-03 NOTE — Telephone Encounter (Signed)
Pt due for annual in August

## 2018-08-11 ENCOUNTER — Other Ambulatory Visit: Payer: Self-pay

## 2018-08-11 ENCOUNTER — Other Ambulatory Visit: Payer: Self-pay | Admitting: Physician Assistant

## 2018-08-11 ENCOUNTER — Encounter: Payer: Self-pay | Admitting: Physician Assistant

## 2018-08-11 ENCOUNTER — Ambulatory Visit: Payer: Commercial Managed Care - PPO | Admitting: Physician Assistant

## 2018-08-11 VITALS — BP 105/55 | HR 81 | Temp 98.0°F | Resp 16 | Wt 107.8 lb

## 2018-08-11 DIAGNOSIS — L659 Nonscarring hair loss, unspecified: Secondary | ICD-10-CM | POA: Diagnosis not present

## 2018-08-11 DIAGNOSIS — R7989 Other specified abnormal findings of blood chemistry: Secondary | ICD-10-CM | POA: Diagnosis not present

## 2018-08-11 NOTE — Progress Notes (Signed)
Patient: Carolyn Johnston Female    DOB: January 21, 1981   38 y.o.   MRN: [id number] Visit Date: 08/11/2018  Today's Provider: Trey SailorsAdriana M Youlanda Tomassetti, PA-C   Chief Complaint  Patient presents with  . Hair/Scalp Problem   Subjective:     HPI Patient here today c/o hair loss for about 4 months. Patient reports that she has been having abnormal periods. Patient reports she does not take the sugar pill, and still having periods. Patient reports using Rogain for women. Patient reports she has taken biotin, reports no improvement. Patient has stopped biotin. No history of thyroid issues. Does not recall time where she was very sick - does report stress that has resolved. Night sweats, woke up drenched - doesn't happen every night but randomly.   Reports irregular vaginal bleeding on continuous OCP. Reports bleeding for one week at a time.   Wt Readings from Last 3 Encounters:  08/11/18 107 lb 12.8 oz (48.9 kg)  10/13/17 110 lb (49.9 kg)  05/21/17 109 lb 9.6 oz (49.7 kg)     No Known Allergies   Current Outpatient Medications:  .  EMGALITY 120 MG/ML SOSY, INJECT 120 MG SUBCUTANEOUSLY EVERY 28 (TWENTY-EIGHT) DAYS, Disp: , Rfl: 6 .  SPRINTEC 28 0.25-35 MG-MCG tablet, TAKE 1 TABLET BY MOUTH EVERY DAY -SKIP PLACEBO TABLETS, Disp: 28 tablet, Rfl: 3 .  valACYclovir (VALTREX) 1000 MG tablet, TAKE 1 TABLET DAILY, Disp: 90 tablet, Rfl: 3  Review of Systems  Constitutional: Negative.   Cardiovascular: Negative.     Social History   Tobacco Use  . Smoking status: Former Smoker    Quit date: 06/29/2008    Years since quitting: 10.1  . Smokeless tobacco: Never Used  Substance Use Topics  . Alcohol use: No      Objective:   BP (!) 105/55 (BP Location: Left Arm, Patient Position: Sitting, Cuff Size: Normal)   Pulse 81   Temp 98 F (36.7 C) (Oral)   Resp 16   Wt 107 lb 12.8 oz (48.9 kg)   BMI 19.72 kg/m  Vitals:   08/11/18 1450  BP: (!) 105/55  Pulse: 81  Resp: 16  Temp: 98 F  (36.7 C)  TempSrc: Oral  Weight: 107 lb 12.8 oz (48.9 kg)     Physical Exam Constitutional:      Appearance: Normal appearance.  Neck:     Thyroid: No thyromegaly.  Cardiovascular:     Pulses: Normal pulses.     Heart sounds: Normal heart sounds.  Pulmonary:     Effort: Pulmonary effort is normal.     Breath sounds: Normal breath sounds.  Skin:    General: Skin is warm and dry.     Comments: Frontal and bitemporal hair thinning. No obvious lesions, rashes, or discrete circles of hair loss.   Neurological:     Mental Status: She is alert.  Psychiatric:        Mood and Affect: Mood normal.        Behavior: Behavior normal.      No results found for any visits on 08/11/18.     Assessment & Plan    1. Hair loss  - CBC with Differential - Comprehensive Metabolic Panel (CMET) - TSH  2. Low vitamin D level  - Vitamin D 1,25 dihydroxy  The entirety of the information documented in the History of Present Illness, Review of Systems and Physical Exam were personally obtained by me. Portions of this information  were initially documented by Lynford Humphrey, CMA and reviewed by me for thoroughness and accuracy.   The entirety of the information documented in the History of Present Illness, Review of Systems and Physical Exam were personally obtained by me. Portions of this information were initially documented by Lynford Humphrey, CMA and reviewed by me for thoroughness and accuracy.      Trinna Post, PA-C  [facility]

## 2018-08-11 NOTE — Patient Instructions (Signed)

## 2018-08-12 ENCOUNTER — Telehealth: Payer: Self-pay

## 2018-08-12 NOTE — Telephone Encounter (Signed)
-----   Message from Trinna Post, [place] sent at 08/12/2018  8:41 AM EDT ----- Can we please add Fe + TIBC +  Fer under dx hair loss? Thanks.

## 2018-08-12 NOTE — Telephone Encounter (Signed)
Called [facility] to add test. Test # [id number], Dx code L65.9

## 2018-08-13 ENCOUNTER — Telehealth: Payer: Self-pay

## 2018-08-13 LAB — IRON,TIBC AND FERRITIN PANEL
Ferritin: 64 ng/mL (ref 15–150)
Iron Saturation: 24 % (ref 15–55)
Iron: 79 ug/dL (ref 27–159)
Total Iron Binding Capacity: 325 ug/dL (ref 250–450)
UIBC: 246 ug/dL (ref 131–425)

## 2018-08-13 LAB — SPECIMEN STATUS REPORT

## 2018-08-13 NOTE — Telephone Encounter (Signed)
-----   Message from Trinna Post, [place] sent at 08/13/2018 10:41 AM EDT ----- Thyroid, blood counts and iron levels are all normal. Vitamin D pending. Sometimes we can't find a cause of hair loss but it is usually temporary and hair will generally regrow in 6-12 months. Can continue to use rogaine. I can also refer her to dermatolgy to see if they have any options.

## 2018-08-13 NOTE — Telephone Encounter (Signed)
Patient advised as below.  

## 2018-08-15 LAB — VITAMIN D 1,25 DIHYDROXY
Vitamin D 1, 25 (OH)2 Total: 72 pg/mL — ABNORMAL HIGH
Vitamin D2 1, 25 (OH)2: <10 pg/mL
Vitamin D3 1, 25 (OH)2: 72 pg/mL

## 2018-08-15 LAB — CBC WITH DIFFERENTIAL/PLATELET
Basophils Absolute: 0 10*3/uL (ref 0.0–0.2)
Basos: 1 %
EOS (ABSOLUTE): 0 10*3/uL (ref 0.0–0.4)
Eos: 1 %
Hematocrit: 35 % (ref 34.0–46.6)
Hemoglobin: 12.3 g/dL (ref 11.1–15.9)
Immature Grans (Abs): 0 10*3/uL (ref 0.0–0.1)
Immature Granulocytes: 0 %
Lymphocytes Absolute: 1.8 10*3/uL (ref 0.7–3.1)
Lymphs: 32 %
MCH: 31.9 pg (ref 26.6–33.0)
MCHC: 35.1 g/dL (ref 31.5–35.7)
MCV: 91 fL (ref 79–97)
Monocytes Absolute: 0.4 10*3/uL (ref 0.1–0.9)
Monocytes: 8 %
Neutrophils Absolute: 3.2 10*3/uL (ref 1.4–7.0)
Neutrophils: 58 %
Platelets: 267 10*3/uL (ref 150–450)
RBC: 3.86 x10E6/uL (ref 3.77–5.28)
RDW: 12.2 % (ref 11.7–15.4)
WBC: 5.5 10*3/uL (ref 3.4–10.8)

## 2018-08-15 LAB — COMPREHENSIVE METABOLIC PANEL
ALT: 10 IU/L (ref 0–32)
AST: 10 IU/L (ref 0–40)
Albumin/Globulin Ratio: 1.5 (ref 1.2–2.2)
Albumin: 4.1 g/dL (ref 3.8–4.8)
Alkaline Phosphatase: 48 IU/L (ref 39–117)
BUN/Creatinine Ratio: 13 (ref 9–23)
BUN: 10 mg/dL (ref 6–20)
Bilirubin Total: 0.2 mg/dL (ref 0.0–1.2)
CO2: 24 mmol/L (ref 20–29)
Calcium: 9.4 mg/dL (ref 8.7–10.2)
Chloride: 102 mmol/L (ref 96–106)
Creatinine, Ser: 0.77 mg/dL (ref 0.57–1.00)
GFR calc Af Amer: 113 mL/min/{1.73_m2} (ref >59)
GFR calc non Af Amer: 98 mL/min/{1.73_m2} (ref >59)
Globulin, Total: 2.8 g/dL (ref 1.5–4.5)
Glucose: 126 mg/dL — ABNORMAL HIGH (ref 65–99)
Potassium: 3.8 mmol/L (ref 3.5–5.2)
Sodium: 140 mmol/L (ref 134–144)
Total Protein: 6.9 g/dL (ref 6.0–8.5)

## 2018-08-15 LAB — TSH: TSH: 0.645 u[IU]/mL (ref 0.450–4.500)

## 2018-10-16 ENCOUNTER — Other Ambulatory Visit: Payer: Self-pay | Admitting: Obstetrics and Gynecology

## 2018-10-16 NOTE — Telephone Encounter (Signed)
Pt is over due for annual exam

## 2018-11-13 ENCOUNTER — Other Ambulatory Visit: Payer: Self-pay | Admitting: Obstetrics and Gynecology

## 2018-11-23 ENCOUNTER — Other Ambulatory Visit: Payer: Self-pay | Admitting: Obstetrics and Gynecology

## 2018-12-06 ENCOUNTER — Other Ambulatory Visit: Payer: Self-pay | Admitting: Obstetrics and Gynecology

## 2018-12-16 ENCOUNTER — Encounter: Payer: Self-pay | Admitting: Physician Assistant

## 2018-12-17 NOTE — Telephone Encounter (Signed)
Patient is calling back regarding patient email she sent yesterday.

## 2018-12-26 ENCOUNTER — Other Ambulatory Visit: Payer: Self-pay | Admitting: Obstetrics and Gynecology

## 2018-12-28 ENCOUNTER — Encounter: Payer: Self-pay | Admitting: Physician Assistant

## 2018-12-28 ENCOUNTER — Other Ambulatory Visit: Payer: Self-pay

## 2018-12-28 ENCOUNTER — Telehealth: Payer: Self-pay

## 2018-12-28 NOTE — Telephone Encounter (Signed)
Pt calling; needs refill of bc and valtrex.  Pharm sent request but it was refused but didn't say why.  Pt is confused b/c she had a pap last year and doesn't need to have another one for a few more years  [phone]  Adv pt still needs annual exam every year.  Tx'd to [facility] for scheduling.

## 2019-01-06 ENCOUNTER — Other Ambulatory Visit: Payer: Self-pay

## 2019-01-06 MED ORDER — NORGESTIMATE-ETH ESTRADIOL 0.25-35 MG-MCG PO TABS: ORAL_TABLET | ORAL | 0 refills | Status: DC

## 2019-01-06 MED ORDER — VALACYCLOVIR HCL 1 G PO TABS: 1000.0000 mg | ORAL_TABLET | Freq: Every day | ORAL | 0 refills | Status: DC

## 2019-01-06 NOTE — Telephone Encounter (Signed)
Pt would like a nurse call back. Has questions about her medicine.

## 2019-01-06 NOTE — Telephone Encounter (Signed)
I spoke to pt. Refills have been sent to pharmacy

## 2019-02-08 ENCOUNTER — Other Ambulatory Visit: Payer: Self-pay | Admitting: Obstetrics and Gynecology

## 2019-02-15 ENCOUNTER — Other Ambulatory Visit: Payer: Self-pay

## 2019-02-15 ENCOUNTER — Ambulatory Visit (INDEPENDENT_AMBULATORY_CARE_PROVIDER_SITE_OTHER): Payer: Commercial Managed Care - PPO | Admitting: Obstetrics and Gynecology

## 2019-02-15 ENCOUNTER — Encounter: Payer: Self-pay | Admitting: Obstetrics and Gynecology

## 2019-02-15 ENCOUNTER — Encounter

## 2019-02-15 VITALS — BP 112/66 | Ht 62.0 in | Wt 106.0 lb

## 2019-02-15 DIAGNOSIS — Z1239 Encounter for other screening for malignant neoplasm of breast: Secondary | ICD-10-CM

## 2019-02-15 DIAGNOSIS — Z3041 Encounter for surveillance of contraceptive pills: Secondary | ICD-10-CM

## 2019-02-15 DIAGNOSIS — Z01419 Encounter for gynecological examination (general) (routine) without abnormal findings: Secondary | ICD-10-CM | POA: Diagnosis not present

## 2019-02-15 MED ORDER — VALACYCLOVIR HCL 1 G PO TABS: 1000.0000 mg | ORAL_TABLET | Freq: Every day | ORAL | 3 refills | Status: DC

## 2019-02-15 MED ORDER — NORGESTIMATE-ETH ESTRADIOL 0.25-35 MG-MCG PO TABS: ORAL_TABLET | ORAL | 4 refills | Status: DC

## 2019-02-15 NOTE — Progress Notes (Signed)
Gynecology Annual Exam   PCP: Trinna Post, PA-C  Chief Complaint:  Chief Complaint  Patient presents with  . Gynecologic Exam    History of Present Illness: Patient is a 38 y.o. G3P0010 presents for annual exam. The patient has no complaints today.   LMP: Patient's last menstrual period was 02/02/2019 (exact date). Regular  monthly, no menstrual concerns.    The patient is sexually active. She currently uses OCP (estrogen/progesterone) for contraception. She denies dyspareunia.  The patient does perform self breast exams.  There is no notable family history of breast or ovarian cancer in her family.  The patient wears seatbelts: yes.   The patient has regular exercise: not asked.    The patient denies current symptoms of depression.    Review of Systems: Review of Systems  Constitutional: Negative for chills and fever.  HENT: Negative for congestion.   Respiratory: Negative for cough and shortness of breath.   Cardiovascular: Negative for chest pain and palpitations.  Gastrointestinal: Negative for abdominal pain, constipation, diarrhea, heartburn, nausea and vomiting.  Genitourinary: Negative for dysuria, frequency and urgency.  Skin: Negative for itching and rash.  Neurological: Negative for dizziness and headaches.  Endo/Heme/Allergies: Negative for polydipsia.  Psychiatric/Behavioral: Negative for depression.    Past Medical History:  Past Medical History:  Diagnosis Date  . Allergy    trees, dust mites  . Migraines     Past Surgical History:  Past Surgical History:  Procedure Laterality Date  . adnoids suture    . DILATION AND CURETTAGE OF UTERUS    . TONSILLECTOMY    . WISDOM TOOTH EXTRACTION      Gynecologic History:  Patient's last menstrual period was 02/02/2019 (exact date). Contraception: OCP Last Pap: Results were:10/13/2017 NIL and HR HPV negative   Obstetric History: G3P0010  Family History:  Family History  Problem Relation Age of  Onset  . Arthritis Mother   . Headache Mother   . Fibroids Mother   . Drug abuse Father   . Diabetes Maternal Grandmother   . Parkinson's disease Maternal Grandfather     Social History:  Social History   Socioeconomic History  . Marital status: Single    Spouse name: Not on file  . Number of children: Not on file  . Years of education: Not on file  . Highest education level: Not on file  Occupational History  . Not on file  Tobacco Use  . Smoking status: Former Smoker    Quit date: 06/29/2008    Years since quitting: 10.6  . Smokeless tobacco: Never Used  Substance and Sexual Activity  . Alcohol use: No  . Drug use: No  . Sexual activity: Yes    Birth control/protection: Pill  Other Topics Concern  . Not on file  Social History Narrative  . Not on file   Social Determinants of Health   Financial Resource Strain:   . Difficulty of Paying Living Expenses: Not on file  Food Insecurity:   . Worried About Charity fundraiser in the Last Year: Not on file  . Ran Out of Food in the Last Year: Not on file  Transportation Needs:   . Lack of Transportation (Medical): Not on file  . Lack of Transportation (Non-Medical): Not on file  Physical Activity:   . Days of Exercise per Week: Not on file  . Minutes of Exercise per Session: Not on file  Stress:   . Feeling of Stress : Not  on file  Social Connections:   . Frequency of Communication with Friends and Family: Not on file  . Frequency of Social Gatherings with Friends and Family: Not on file  . Attends Religious Services: Not on file  . Active Member of Clubs or Organizations: Not on file  . Attends Banker Meetings: Not on file  . Marital Status: Not on file  Intimate Partner Violence:   . Fear of Current or Ex-Partner: Not on file  . Emotionally Abused: Not on file  . Physically Abused: Not on file  . Sexually Abused: Not on file    Allergies:  No Known Allergies  Medications: Prior to Admission  medications   Medication Sig Start Date End Date Taking? Authorizing Provider  EMGALITY 120 MG/ML SOSY INJECT 120 MG SUBCUTANEOUSLY EVERY 28 (TWENTY-EIGHT) DAYS 10/07/17  Yes [provider]  norgestimate-ethinyl estradiol (SPRINTEC 28) 0.25-35 MG-MCG tablet TAKE 1 TABLET BY MOUTH EVERY DAY -SKIP PLACEBO TABLETS 02/08/19  Yes Vena Austria, MD  valACYclovir (VALTREX) 1000 MG tablet Take 1 tablet (1,000 mg total) by mouth daily. 01/06/19  Yes Vena Austria, MD    Physical Exam Vitals: Blood pressure 112/66, height 5\' 2"  (1.575 m), weight 106 lb (48.1 kg), last menstrual period 02/02/2019.  General: NAD HEENT: normocephalic, anicteric Thyroid: no enlargement, no palpable nodules Pulmonary: No increased work of breathing, CTAB Cardiovascular: RRR, distal pulses 2+ Breast: Breast symmetrical, no tenderness, no palpable nodules or masses, no skin or nipple retraction present, no nipple discharge.  No axillary or supraclavicular lymphadenopathy. Abdomen: NABS, soft, non-tender, non-distended.  Umbilicus without lesions.  No hepatomegaly, splenomegaly or masses palpable. No evidence of hernia  Genitourinary:  External: Normal external female genitalia.  Normal urethral meatus, normal Bartholin's and Skene's glands.    Vagina: Normal vaginal mucosa, no evidence of prolapse.    Cervix: Grossly normal in appearance, no bleeding  Uterus: Non-enlarged, mobile, normal contour.  No CMT  Adnexa: ovaries non-enlarged, no adnexal masses  Rectal: deferred  Lymphatic: no evidence of inguinal lymphadenopathy Extremities: no edema, erythema, or tenderness Neurologic: Grossly intact Psychiatric: mood appropriate, affect full  Female chaperone present for pelvic and breast  portions of the physical exam    Assessment: 38 y.o. G3P0010 routine annual exam  Plan: Problem List Items Addressed This Visit    None    Visit Diagnoses    Encounter for gynecological examination without  abnormal finding    -  Primary   Breast screening       Encounter for surveillance of contraceptive pills          1) STI screening  was notoffered and therefore not obtained  2)  ASCCP guidelines and rational discussed.  Patient opts for every 3 years screening interval  3) Contraception - the patient is currently using  OCP (estrogen/progesterone).  She is happy with her current form of contraception and plans to continue  4) Routine healthcare maintenance including cholesterol, diabetes screening discussed managed by PCP  5) Return in about 1 year (around 02/15/2020) for annual.   02/17/2020, MD, Vena Austria [facility], [facility] 02/15/2019, 2:53 PM

## 2019-02-16 ENCOUNTER — Ambulatory Visit: Payer: Commercial Managed Care - PPO | Admitting: Obstetrics and Gynecology

## 2019-03-03 ENCOUNTER — Other Ambulatory Visit: Payer: Self-pay | Admitting: Obstetrics and Gynecology

## 2019-03-10 ENCOUNTER — Emergency Department: Payer: No Typology Code available for payment source

## 2019-03-10 ENCOUNTER — Encounter: Payer: Self-pay | Admitting: Medical Oncology

## 2019-03-10 ENCOUNTER — Other Ambulatory Visit: Payer: Self-pay

## 2019-03-10 ENCOUNTER — Emergency Department
Admission: EM | Admit: 2019-03-10 | Discharge: 2019-03-10 | Disposition: A | Discharge status: Home / Self Care | Payer: No Typology Code available for payment source | Attending: Emergency Medicine | Admitting: Emergency Medicine

## 2019-03-10 DIAGNOSIS — Z23 Encounter for immunization: Secondary | ICD-10-CM | POA: Insufficient documentation

## 2019-03-10 DIAGNOSIS — Y929 Unspecified place or not applicable: Secondary | ICD-10-CM | POA: Diagnosis not present

## 2019-03-10 DIAGNOSIS — Y9389 Activity, other specified: Secondary | ICD-10-CM | POA: Diagnosis not present

## 2019-03-10 DIAGNOSIS — Y990 Civilian activity done for income or pay: Secondary | ICD-10-CM | POA: Insufficient documentation

## 2019-03-10 DIAGNOSIS — W269XXA Contact with unspecified sharp object(s), initial encounter: Secondary | ICD-10-CM | POA: Insufficient documentation

## 2019-03-10 DIAGNOSIS — S81811A Laceration without foreign body, right lower leg, initial encounter: Secondary | ICD-10-CM

## 2019-03-10 DIAGNOSIS — Z87891 Personal history of nicotine dependence: Secondary | ICD-10-CM | POA: Insufficient documentation

## 2019-03-10 DIAGNOSIS — Z79899 Other long term (current) drug therapy: Secondary | ICD-10-CM | POA: Insufficient documentation

## 2019-03-10 DIAGNOSIS — S81011A Laceration without foreign body, right knee, initial encounter: Secondary | ICD-10-CM | POA: Insufficient documentation

## 2019-03-10 MED ORDER — LIDOCAINE-EPINEPHRINE-TETRACAINE (LET) TOPICAL GEL
3.0000 mL | Freq: Once | TOPICAL | Status: AC
  Administered 2019-03-10: 3 mL via TOPICAL

## 2019-03-10 MED ORDER — TETANUS-DIPHTH-ACELL PERTUSSIS 5-2.5-18.5 LF-MCG/0.5 IM SUSP
0.5000 mL | Freq: Once | INTRAMUSCULAR | Status: AC
  Administered 2019-03-10: 0.5 mL via INTRAMUSCULAR
  Filled 2019-03-10: qty 0.5

## 2019-03-10 NOTE — ED Notes (Signed)
See triage note  States she bent down to fix a probe on her machine  States another probe stabbed her leg  States they were able to remove the probe but the tip is ruby  Unsure if the tip is still in there

## 2019-03-10 NOTE — ED Provider Notes (Signed)
[facility] Emergency Department Provider Note  ____________________________________________   First MD Initiated Contact with Patient 03/10/19 1039     (approximate)  I have reviewed the triage vital signs and the nursing notes.   HISTORY  Chief Complaint Laceration   HPI Carolyn Johnston is a 39 y.o. female presents to the ED with complaint of a laceration to her right leg while at work.  Patient states that she bent down to fix a probe on her machine and was stabbed by the probe which also has a ruby tip.  Patient is unaware of her last tetanus.  No active bleeding at this time.  She rates her pain as a 3 out of 10.        Past Medical History:  Diagnosis Date  . Allergy    trees, dust mites  . Migraines     Patient Active Problem List   Diagnosis Date Noted  . Vitamin D insufficiency 06/06/2016  . Recurrent occipital headache 04/22/2016  . Bilateral occipital neuralgia 04/22/2016  . Cervico-occipital neuralgia of right side 04/22/2016  . Cervico-occipital neuralgia of left side 04/22/2016  . Chronic pain syndrome 04/22/2016  . Vestibular migraine 06/01/2013    Past Surgical History:  Procedure Laterality Date  . adnoids suture    . DILATION AND CURETTAGE OF UTERUS    . TONSILLECTOMY    . WISDOM TOOTH EXTRACTION      Prior to Admission medications   Medication Sig Start Date End Date Taking? Authorizing Provider  EMGALITY 120 MG/ML SOSY INJECT 120 MG SUBCUTANEOUSLY EVERY 28 (TWENTY-EIGHT) DAYS 10/07/17   [provider]  norgestimate-ethinyl estradiol (SPRINTEC 28) 0.25-35 MG-MCG tablet TAKE 1 TABLET BY MOUTH EVERY DAY -SKIP PLACEBO TABLETS 03/03/19   Malachy Mood, MD  valACYclovir (VALTREX) 1000 MG tablet Take 1 tablet (1,000 mg total) by mouth daily. 02/15/19   Malachy Mood, MD    Allergies Patient has no known allergies.  Family History  Problem Relation Age of Onset  . Arthritis Mother   . Headache Mother     . Fibroids Mother   . Drug abuse Father   . Diabetes Maternal Grandmother   . Parkinson's disease Maternal Grandfather     Social History Social History   Tobacco Use  . Smoking status: Former Smoker    Quit date: 06/29/2008    Years since quitting: 10.7  . Smokeless tobacco: Never Used  Substance Use Topics  . Alcohol use: No  . Drug use: No    Review of Systems Constitutional: No fever/chills Cardiovascular: Denies chest pain. Respiratory: Denies shortness of breath. Musculoskeletal: Negative for right leg pain. Skin: Positive for laceration right leg. Neurological: Negative for headaches, focal weakness or numbness. ____________________________________________   PHYSICAL EXAM:  VITAL SIGNS: ED Triage Vitals [03/10/19 1019]  Enc Vitals Group     BP (!) 120/52     Pulse Rate 78     Resp 18     Temp 98.9 F (37.2 C)     Temp Source Oral     SpO2 99 %     Weight 107 lb (48.5 kg)     Height 5\' 2"  (1.575 m)     Head Circumference      Peak Flow      Pain Score 3     Pain Loc      Pain Edu?      Excl. in [place]?    Constitutional: Alert and oriented. Well appearing and in no  acute distress. Eyes: Conjunctivae are normal.  Head: Atraumatic. Neck: No stridor.   Cardiovascular: Normal rate, regular rhythm. Grossly normal heart sounds.  Good peripheral circulation. Respiratory: Normal respiratory effort.  No retractions. Lungs CTAB. Musculoskeletal: No lower extremity tenderness nor edema.  No joint effusions. Neurologic:  Normal speech and language. No gross focal neurologic deficits are appreciated.  Skin:  Skin is warm, dry.  There is a superficial linear laceration to the lateral aspect of the right knee without active bleeding.  No obvious foreign body is present. Psychiatric: Mood and affect are normal. Speech and behavior are normal.  ____________________________________________   LABS (all labs ordered are listed, but only abnormal results are  displayed)  Labs Reviewed - No data to display ____________________________________________  RADIOLOGY    Official radiology report(s): DG Knee Complete 4 Views Right  Result Date: 03/10/2019 CLINICAL DATA:  Laceration EXAM: RIGHT KNEE - COMPLETE 4+ VIEW COMPARISON:  None. FINDINGS: Frontal, lateral, and bilateral oblique views were obtained. There is no evident fracture or dislocation. No joint effusion. No appreciable joint space narrowing or erosion. No appreciable soft tissue air or radiopaque foreign body. IMPRESSION: No evident radiopaque foreign body. No fracture or dislocation. No joint effusion. No appreciable arthropathy. Electronically Signed   By: Bretta Bang III M.D.   On: 03/10/2019 11:22    ____________________________________________   PROCEDURES  Procedure(s) performed (including Critical Care):  Marland KitchenMarland KitchenLaceration Repair  Date/Time: 03/10/2019 12:20 PM Performed by: Tommi Rumps, PA-C Authorized by: Tommi Rumps, PA-C   Consent:    Consent obtained:  Verbal   Consent given by:  Patient   Risks discussed:  Pain and poor wound healing Anesthesia (see MAR for exact dosages):    Anesthesia method:  Topical application Laceration details:    Location:  Leg   Leg location:  R knee   Length (cm):  3 Pre-procedure details:    Preparation:  Imaging obtained to evaluate for foreign bodies Exploration:    Hemostasis achieved with:  LET Treatment:    Area cleansed with:  Saline   Amount of cleaning:  Standard   Irrigation solution:  Sterile saline   Visualized foreign bodies/material removed: no   Skin repair:    Repair method:  Steri-Strips Approximation:    Approximation:  Loose Post-procedure details:    Dressing:  Open (no dressing)   Patient tolerance of procedure:  Tolerated well, no immediate complications     ____________________________________________   INITIAL IMPRESSION / ASSESSMENT AND PLAN / ED COURSE  As part of my medical  decision making, I reviewed the following data within the electronic MEDICAL RECORD NUMBER Notes from prior ED visits and [place] Controlled Substance Database  39 year old female presents to the ED after a Workmen's Comp. injury in which she has a superficial laceration to her right knee area.  Company sent patient here for a tetanus and also for evaluation of possible foreign body and care for her laceration.  Laceration was superficial and no active bleeding was noted.  Imaging did not show a foreign body.  Area was cleaned and Steri-Strips applied.  Patient is aware to watch for any signs of infection.  She was instructed to take Tylenol or ibuprofen if needed for pain.  A note returning her to work with restrictions was written.  ____________________________________________   FINAL CLINICAL IMPRESSION(S) / ED DIAGNOSES  Final diagnoses:  Laceration of right lower extremity, initial encounter     ED Discharge Orders    None  Note:  This document was prepared using Dragon voice recognition software and may include unintentional dictation errors.    Tommi Rumps, PA-C 03/10/19 1404    Sharman Cheek, MD 03/12/19 [phone]

## 2019-03-10 NOTE — Discharge Instructions (Signed)
Follow-up with your company's doctor if any continued problems or concerns.  If your company does not have a doctor return to the emergency department if needed.  Watch the area for any signs of infection such as redness or pus.  Keep the area clean and dry.  You may take Tylenol or ibuprofen if needed for pain.  Steri-Strips will fall off on their own.

## 2019-03-10 NOTE — ED Triage Notes (Signed)
Pt reports she was at work when a probe cut her rt leg.

## 2019-03-10 NOTE — ED Notes (Signed)
Spoke with Carolyn Johnston at [facility] [phone] to inform her that we do not do breath analysis here. Carolyn Johnston stated "that is okay, we do not need anything for this particular case."

## 2019-03-11 ENCOUNTER — Encounter: Payer: Self-pay | Admitting: Adult Health

## 2019-03-11 ENCOUNTER — Ambulatory Visit (INDEPENDENT_AMBULATORY_CARE_PROVIDER_SITE_OTHER): Payer: No Typology Code available for payment source | Admitting: Adult Health

## 2019-03-11 VITALS — BP 128/70 | HR 73 | Temp 97.1°F | Resp 16 | Wt 108.0 lb

## 2019-03-11 DIAGNOSIS — T148XXA Other injury of unspecified body region, initial encounter: Secondary | ICD-10-CM

## 2019-03-11 DIAGNOSIS — R58 Hemorrhage, not elsewhere classified: Secondary | ICD-10-CM | POA: Diagnosis not present

## 2019-03-11 NOTE — Patient Instructions (Signed)
Sterile Tape Wound Care Some cuts and wounds can be closed using sterile tape, also called skin adhesive strips. Skin adhesive strips can be used for shallow (superficial) and simple cuts, wounds, lacerations, and some surgical incisions. These strips act in place of stitches, or in addition to stitches, to hold the edges of the wound together to allow for better healing. Unlike stitches, the adhesive strips do not require needles or anesthetic medicine for placement. The strips usually fall off on their own as the wound is healing. It is important to take proper care of your wound at home while it heals. How to care for a sterile tape wound   Try to keep the area around your wound clean and dry. Do not allow the adhesive strips to get wet for the first 12 hours.  Do not use any soaps or ointments on the wound for the first 12 hours.  If a bandage (dressing) has been applied, keep it dry.  Follow instructions from your health care provider about how often to change the dressing. ? Wash your hands with soap and water before you change your dressing. If soap and water are not available, use hand sanitizer. ? Change your dressing as told by your health care provider. ? Leave adhesive strips in place. These skin closures may need to stay in place for 2 weeks or longer. If adhesive strip edges start to loosen and curl up, you may trim the loose edges. Do not remove adhesive strips completely unless your health care provider tells you to do that.  Do not scratch, rub, or pick at the wound area.  Protect the wound from further injury until it is healed.  Protect the wound from sun and tanning bed exposure while it is healing, and for several weeks after healing.  Check the wound every day for signs of infection. Check for: ? More redness, swelling, or pain. ? More fluid or blood. ? Warmth. ? Pus or a bad smell. Follow these instructions at home:  Take over-the-counter and prescription medicines  only as told by your health care provider.  Keep all follow-up visits as told by your health care provider. This is important. Contact a health care provider if:  Your adhesive strips become soaked with blood or fall off before the wound has healed. The tape will need to be replaced.  You have a fever. Get help right away if:  You have chills.  You develop a rash after the strips are applied.  You have a red streak that goes away from the wound.  You have more redness, swelling, or pain around your wound.  You have more fluid or blood coming from your wound.  Your wound feels warm to the touch.  You have pus or a bad smell coming from your wound.  Your wound breaks open. This information is not intended to replace advice given to you by your health care provider. Make sure you discuss any questions you have with your health care provider. Document Revised: 01/17/2017 Document Reviewed: 12/29/2015 Elsevier Patient Education  2020 [employer] Nonsutured Laceration Care A laceration is a cut that may go through all layers of the skin and extend into the tissue that is right under the skin. This type of cut is usually stitched up (sutured) or closed with tape (adhesive strips) or skin glue shortly after the injury happens. However, if the wound is dirty or if several hours pass before medical treatment is provided, it is likely that  germs (bacteria) will enter the wound. Closing a laceration after bacteria have entered it increases the risk of infection. In these cases, your health care provider may leave the laceration open (nonsutured) and cover it with a bandage. This type of treatment helps prevent infection and allows the wound to heal from the deepest layer of tissue damage up to the surface. An open fracture is a type of injury that may involve nonsutured lacerations. An open fracture is a break in a bone that happens along with lacerations through the skin at the fracture  site. What are the risks? Caring for a nonsutured laceration is safe. However, problems may occur, including a higher risk for:  Scarring.  Infection.  Slow healing. Supplies needed:  Soap.  Hand sanitizer.  Sterile water or irrigation solution.  Bandages (dressings).  Clean towel.  Antibiotic ointment. How to care for your nonsutured laceration Follow instructions from your health care provider about how to take care of your wound.  Keep the wound clean and dry.  Change any dressings as told by your health care provider. This includes changing the dressing when it starts to smell, or when it gets wet or dirty.  Clean the wound one time each day, or as often as told by your health care provider. To clean your wound: 1. Wash your hands with soap and water. If soap and water are not available, use hand sanitizer. 2. Remove any dressing as told by your health care provider. 3. Clean the wound with sterile water or irrigation solution as told by your health care provider. 4. Pat the wound dry with a clean towel. Do not rub the wound. 5. Apply a thin layer of antibiotic ointment to the wound as told by your health care provider. This will prevent infection and keep the dressing from sticking to the wound. 6. Apply a new dressing as told by your health care provider.  Check your wound every day for signs of infection. Watch for: ? Redness, swelling, or pain. ? Fluid, blood, or pus. ? Bad smell on the wound or dressing. ? Warmth.  Do not take baths, swim, or do anything that puts your wound underwater until your health care provider approves.  Do not scratch or pick at the wound. Follow these instructions at home:  Take or apply over-the-counter and prescription medicines only as told by your health care provider.  If you were prescribed an antibiotic medicine, take or apply it as told by your health care provider. Do not stop using the antibiotic even if your condition  improves.  Do not inject anything into the wound unless directed by your health care provider.  Raise (elevate) the injured area above the level of your heart while you are sitting or lying down, if possible.  If directed, put ice on the affected area: ? Put ice in a plastic bag. ? Place a towel between your skin and the bag. ? Leave the ice on for 20 minutes, 2-3 times a day.  Keep all follow-up visits as told by your health care provider. This is important. Contact a health care provider if:  You received a tetanus shot and you have swelling, severe pain, redness, or bleeding at the injection site.  You have a fever.  Your pain is not controlled with medicine.  You have increased redness, swelling, or pain at the site of your wound.  You have fluid, blood, or pus coming from your wound.  You notice a bad smell coming  from your wound or your dressing.  You notice something coming out of the wound, such as wood or glass.  You notice a change in the color of your skin near your wound.  You develop a new rash.  You need to change the dressing frequently due to fluid, blood, or pus draining from the wound.  You develop numbness around your wound. Get help right away if:  Your pain suddenly increases and is severe.  You develop severe swelling around the wound.  The wound is on your hand or foot and you cannot properly move a finger or toe.  The wound is on your hand or foot, and you notice that your fingers or toes look pale or bluish.  You have a red streak going away from your wound. Summary  A laceration is a cut that may go through all layers of the skin and extend into the tissue that is right under the skin. It is usually closed with stitches, tape, or skin glue shortly after the injury happens.  If a wound is dirty or if several hours pass before medical treatment is provided, the laceration may be kept open (nonsutured) and covered with a bandage.  This type  of treatment helps prevent infection and allows the wound to heal from the deepest layer of tissue damage up to the surface.  Follow instructions from your health care provider about how to take care of your wound. This information is not intended to replace advice given to you by your health care provider. Make sure you discuss any questions you have with your health care provider. Document Revised: 05/29/2018 Document Reviewed: 02/24/2017 Elsevier Patient Education  2020 [employer].

## 2019-03-11 NOTE — Progress Notes (Signed)
Patient: Carolyn Johnston Female    DOB: May 13, 1980   39 y.o.   MRN: [id number] Visit Date: 03/11/2019  Today's Provider: Marcille Buffy, FNP   Chief Complaint  Patient presents with  . Follow-up   Subjective:     HPI  Follow up ER visit  Patient was seen in ER for laceration to her right leg that occurred at work on 03/11/2019. She was treated for superficial laceration with topical anesthesia and Steri-Strips.  No foreign body was visualized on x-ray completed on 03/11/2019. Treatment for this included Tylenol or Ibuprofen PRN. She reports good compliance with treatment. She reports this condition is Unchanged. Patient  Comes in office today for wound check, patient states that her laceration had bled through her clothing today at work. Patient denies any swelling or fever.   Patient has been trying to keep her leg straight as she is instructed to do however at her work she is required to get up and down and move from machine to machine at the [employer].  She noticed when she was getting up and down today that she had a little bit of bleeding on her Steri-Strips and this concerned her.  Denies any pain, erythema or discharge from the area.   Patient  denies any fever, body aches,chills, rash, chest pain, shortness of breath, nausea, vomiting, or diarrhea.  Denies any other additional trauma or injury to the area or any other area.     Reviewed emergency room department note from 03/11/2019 yesterday. Official radiology report(s): DG Knee Complete 4 Views Right Result Date: 03/10/2019 CLINICAL DATA:  Laceration EXAM: RIGHT KNEE - COMPLETE 4+ VIEW COMPARISON:  None. FINDINGS: Frontal, lateral, and bilateral oblique views were obtained. There is no evident fracture or dislocation. No joint effusion. No appreciable joint space narrowing or erosion. No appreciable soft tissue air or radiopaque foreign body. IMPRESSION: No evident radiopaque foreign body. No  fracture or dislocation. No joint effusion. No appreciable arthropathy. Electronically Signed   By: Lowella Grip III M.D.   On: 03/10/2019 11:22   PROCEDURES Procedure(s) performed (including Critical Care): Laceration Repair Date/Time: 03/10/2019 12:20 PM Performed by: Johnn Hai, PA-C Authorized by: Johnn Hai, PA-C  Consent:    Consent obtained:  Verbal   Consent given by:  Patient   Risks discussed:  Pain and poor wound healing Anesthesia (see MAR for exact dosages):    Anesthesia method:  Topical application Laceration details:    Location:  Leg   Leg location:  R knee   Length (cm):  3 Pre-procedure details:    Preparation:  Imaging obtained to evaluate for foreign bodies Exploration:    Hemostasis achieved with:  LET Treatment:    Area cleansed with:  Saline   Amount of cleaning:  Standard   Irrigation solution:  Sterile saline   Visualized foreign bodies/material removed: no   Skin repair:    Repair method:  Steri-Strips Approximation:    Approximation:  Loose Post-procedure details:    Dressing:  Open (no dressing)   Patient tolerance of procedure:  Tolerated well, no immediate complications    ------------------------------------------------------------------------------------   No Known Allergies   Current Outpatient Medications:  .  EMGALITY 120 MG/ML SOSY, INJECT 120 MG SUBCUTANEOUSLY EVERY 28 (TWENTY-EIGHT) DAYS, Disp: , Rfl: 6 .  norgestimate-ethinyl estradiol (SPRINTEC 28) 0.25-35 MG-MCG tablet, TAKE 1 TABLET BY MOUTH EVERY DAY -SKIP PLACEBO TABLETS, Disp: 28 tablet, Rfl: 0 .  valACYclovir (VALTREX)  1000 MG tablet, Take 1 tablet (1,000 mg total) by mouth daily. (Patient not taking: Reported on 03/11/2019), Disp: 90 tablet, Rfl: 3  Review of Systems  Constitutional: Negative.   Respiratory: Negative.   Cardiovascular: Negative.   Gastrointestinal: Negative.   Musculoskeletal: Negative.   Skin: Positive for wound (Right knee).  Negative for color change, pallor and rash.    Social History   Tobacco Use  . Smoking status: Former Smoker    Quit date: 06/29/2008    Years since quitting: 10.7  . Smokeless tobacco: Never Used  Substance Use Topics  . Alcohol use: No      Objective:   BP 128/70   Pulse 73   Temp (!) 97.1 F (36.2 C) (Oral)   Resp 16   Wt 108 lb (49 kg)   SpO2 99%   BMI 19.75 kg/m  Vitals:   03/11/19 1617  BP: 128/70  Pulse: 73  Resp: 16  Temp: (!) 97.1 F (36.2 C)  TempSrc: Oral  SpO2: 99%  Weight: 108 lb (49 kg)  Body mass index is 19.75 kg/m.   Physical Exam Vitals reviewed.  Constitutional:      General: She is not in acute distress.    Appearance: Normal appearance. She is normal weight. She is not ill-appearing or diaphoretic.  HENT:     Head: Normocephalic and atraumatic.     Mouth/Throat:     Mouth: Mucous membranes are moist.  Eyes:     Pupils: Pupils are equal, round, and reactive to light.  Cardiovascular:     Rate and Rhythm: Normal rate and regular rhythm.     Heart sounds: Normal heart sounds.  Pulmonary:     Effort: Pulmonary effort is normal.     Breath sounds: Normal breath sounds.  Musculoskeletal:        General: Normal range of motion.     Cervical back: Normal range of motion and neck supple.  Skin:    General: Skin is warm and dry.     Capillary Refill: Capillary refill takes less than 2 seconds.     Coloration: Skin is not pale.     Findings: Signs of injury and laceration present. No erythema or rash.          Comments: Right knee with scant dried blood on Steri-Strips placed from the emergency room on 03/11/2019.  No fresh bleeding noted.  No gaping skin visualized.  Steri-Strips left in place to promote healing.  No pain or erythema around the area.  No foreign bodies palpated or visualized.  Neurological:     General: No focal deficit present.     Mental Status: She is alert and oriented to person, place, and time.     Cranial Nerves:  No cranial nerve deficit.     Sensory: No sensory deficit.     Motor: No weakness.     Coordination: Coordination normal.     Gait: Gait abnormal (Tries to walk with her leg out straight, due to laceration.).     Deep Tendon Reflexes: Reflexes normal.     Comments: Sensation in bilateral lower extremities is normal.  Psychiatric:        Mood and Affect: Mood normal.        Behavior: Behavior normal.        Thought Content: Thought content normal.        Judgment: Judgment normal.      No results found for any visits on 03/11/19.  Assessment & Plan      Superficial laceration  Bleeding- scant at laceration site    Reinforced Steri-Strips with a large knee bandage.,  Wound care instructions given and signs to watch for for cellulitis or skin infection were discussed and verbalized understanding by patient.  Discussed when to return to the office. This injury did occur at work, she understands that we do not do Teacher, adult education. in this clinic.  Will follow back up with her supervisor.  A work note was provided for 2 days under communications so that she can keep her leg straight at home to promote healing of the area and then returned to work with the return to work restrictions given to her in the emergency department unless she has further symptoms she should follow-up with her employer and return to the clinic if needed.  Also will follow up with her work related to this.  She had been given previous restrictions from the emergency room. Patient understands I feel is best not to remove Steri-Strips given dried blood is scant on the area and no signs of any other bleeding or changes reported.  This will best promote healing. He reports they do not have a Teacher, adult education. Engineer, civil (consulting) or anything at [employer].  Advised patient call the office or your primary care doctor for an appointment if no improvement within 72 hours or if any symptoms change or worsen at any time  Advised ER or urgent  Care if after hours or on weekend. Call 911 for emergency symptoms at any time.Patinet verbalized understanding of all instructions given/reviewed and treatment plan and has no further questions or concerns at this time.    Return in about 1 week (around 03/18/2019), or if symptoms worsen or fail to improve, for at any time for any worsening symptoms, Go to Emergency room/ urgent care if worse.    The entirety of the information documented in the History of Present Illness, Review of Systems and Physical Exam were personally obtained by me. Portions of this information were initially documented by the  Certified Medical Assistant whose name is documented in Epic and reviewed by me for thoroughness and accuracy.  I have personally performed the exam and reviewed the chart and it is accurate to the best of my knowledge.  Museum/gallery conservator has been used and any errors in dictation or transcription are unintentional.  Eula Fried. Flinchum FNP-C  [facility]  Jairo Ben, FNP  [facility]

## 2019-04-23 ENCOUNTER — Other Ambulatory Visit: Payer: Self-pay | Admitting: Obstetrics and Gynecology

## 2019-04-23 NOTE — Telephone Encounter (Signed)
Advise

## 2019-08-11 ENCOUNTER — Encounter: Payer: Self-pay | Admitting: Physician Assistant

## 2019-08-11 NOTE — Telephone Encounter (Signed)
Please review. Thanks!  

## 2019-08-11 NOTE — Telephone Encounter (Signed)
Needs OV thanks. Can be in person or my chart video.

## 2020-03-02 ENCOUNTER — Encounter: Payer: Self-pay | Admitting: Obstetrics and Gynecology

## 2020-03-02 ENCOUNTER — Other Ambulatory Visit: Payer: Self-pay

## 2020-03-02 ENCOUNTER — Ambulatory Visit (INDEPENDENT_AMBULATORY_CARE_PROVIDER_SITE_OTHER): Payer: Commercial Managed Care - PPO | Admitting: Obstetrics and Gynecology

## 2020-03-02 VITALS — BP 92/50 | Ht 62.0 in | Wt 110.0 lb

## 2020-03-02 DIAGNOSIS — Z3041 Encounter for surveillance of contraceptive pills: Secondary | ICD-10-CM | POA: Diagnosis not present

## 2020-03-02 DIAGNOSIS — Z1239 Encounter for other screening for malignant neoplasm of breast: Secondary | ICD-10-CM

## 2020-03-02 DIAGNOSIS — Z01419 Encounter for gynecological examination (general) (routine) without abnormal findings: Secondary | ICD-10-CM

## 2020-03-02 MED ORDER — NORGESTIMATE-ETH ESTRADIOL 0.25-35 MG-MCG PO TABS: ORAL_TABLET | ORAL | 4 refills | Status: DC

## 2020-03-02 NOTE — Progress Notes (Signed)
Gynecology Annual Exam  PCP: Trey Sailors, PA-C  Chief Complaint:  Chief Complaint  Patient presents with   Gynecologic Exam    Annual - Refill birth control, Valtrex. RM 5    History of Present Illness: Patient is a 40 y.o. G3P0010 presents for annual exam. The patient has no complaints today.   LMP: No LMP recorded. Average Interval: regular monthly Duration of flow: 5 days Heavy Menses: no Clots: no Intermenstrual Bleeding: no Postcoital Bleeding: no Dysmenorrhea: no   The patient is sexually active. She currently uses OCP (estrogen/progesterone) for contraception. She denies dyspareunia.  The patient does perform self breast exams.  There is no notable family history of breast or ovarian cancer in her family.  The patient wears seatbelts: yes.   The patient has regular exercise: not asked.    The patient denies current symptoms of depression.    Review of Systems: Review of Systems  Constitutional: Negative for chills and fever.  HENT: Negative for congestion.   Respiratory: Negative for cough and shortness of breath.   Cardiovascular: Negative for chest pain and palpitations.  Gastrointestinal: Negative for abdominal pain, constipation, diarrhea, heartburn, nausea and vomiting.  Genitourinary: Negative for dysuria, frequency and urgency.  Skin: Negative for itching and rash.  Neurological: Negative for dizziness and headaches.  Endo/Heme/Allergies: Negative for polydipsia.  Psychiatric/Behavioral: Negative for depression.    Past Medical History:  Patient Active Problem List   Diagnosis Date Noted   Vitamin D insufficiency 06/06/2016   Recurrent occipital headache 04/22/2016   Bilateral occipital neuralgia 04/22/2016   Cervico-occipital neuralgia of right side 04/22/2016   Cervico-occipital neuralgia of left side 04/22/2016   Chronic pain syndrome 04/22/2016   Vestibular migraine 06/01/2013    Past Surgical History:  Past Surgical History:   Procedure Laterality Date   adnoids suture     DILATION AND CURETTAGE OF UTERUS     TONSILLECTOMY     WISDOM TOOTH EXTRACTION      Gynecologic History:  No LMP recorded. Contraception: OCP (estrogen/progesterone) Last Pap: Results were: d8/26/2019 NIL and HR HPV negative    Obstetric History: G3P0010  Family History:  Family History  Problem Relation Age of Onset   Arthritis Mother    Headache Mother    Fibroids Mother    Drug abuse Father    Diabetes Maternal Grandmother    Parkinson's disease Maternal Grandfather     Social History:  Social History   Socioeconomic History   Marital status: Single    Spouse name: Not on file   Number of children: Not on file   Years of education: Not on file   Highest education level: Not on file  Occupational History   Not on file  Tobacco Use   Smoking status: Former Smoker    Quit date: 06/29/2008    Years since quitting: 11.6   Smokeless tobacco: Never Used  Vaping Use   Vaping Use: Never used  Substance and Sexual Activity   Alcohol use: No   Drug use: No   Sexual activity: Yes    Birth control/protection: Pill  Other Topics Concern   Not on file  Social History Narrative   Not on file   Social Determinants of Health   Financial Resource Strain: Not on file  Food Insecurity: Not on file  Transportation Needs: Not on file  Physical Activity: Not on file  Stress: Not on file  Social Connections: Not on file  Intimate Partner Violence: Not on  file    Allergies:  No Known Allergies  Medications: Prior to Admission medications   Medication Sig Start Date End Date Taking? Authorizing Provider  AIMOVIG 140 MG/ML SOAJ Inject into the skin. 02/10/20  Yes [provider]  clindamycin-benzoyl peroxide (BENZACLIN) gel Apply 1 application topically every morning. 02/03/20  Yes [provider]  norgestimate-ethinyl estradiol (SPRINTEC 28) 0.25-35 MG-MCG tablet TAKE 1 TABLET BY  MOUTH EVERY DAY -SKIP PLACEBO TABLETS 03/03/19  Yes Vena Austria, MD  tretinoin (RETIN-A) 0.05 % cream Apply 1 application topically at bedtime. 02/05/20  Yes [provider]  UBRELVY 100 MG TABS Take by mouth. 02/04/20  Yes [provider]  valACYclovir (VALTREX) 1000 MG tablet TAKE 1 TABLET BY MOUTH EVERY DAY 04/23/19  Yes Vena Austria, MD  EMGALITY 120 MG/ML SOSY INJECT 120 MG SUBCUTANEOUSLY EVERY 28 (TWENTY-EIGHT) DAYS Patient not taking: Reported on 03/02/2020 10/07/17   [provider]    Physical Exam Vitals: Blood pressure (!) 92/50, height 5\' 2"  (1.575 m), weight 110 lb (49.9 kg).  General: NAD HEENT: normocephalic, anicteric Thyroid: no enlargement, no palpable nodules Pulmonary: No increased work of breathing, CTAB Cardiovascular: RRR, distal pulses 2+ Breast: Breast symmetrical, no tenderness, no palpable nodules or masses, no skin or nipple retraction present, no nipple discharge.  No axillary or supraclavicular lymphadenopathy. Abdomen: NABS, soft, non-tender, non-distended.  Umbilicus without lesions.  No hepatomegaly, splenomegaly or masses palpable. No evidence of hernia  Genitourinary:  External: Normal external female genitalia.  Normal urethral meatus, normal Bartholin's and Skene's glands.    Vagina: Normal vaginal mucosa, no evidence of prolapse.    Cervix: Grossly normal in appearance, no bleeding  Uterus: Non-enlarged, mobile, normal contour.  No CMT  Adnexa: ovaries non-enlarged, no adnexal masses  Rectal: deferred  Lymphatic: no evidence of inguinal lymphadenopathy Extremities: no edema, erythema, or tenderness Neurologic: Grossly intact Psychiatric: mood appropriate, affect full  Female chaperone present for pelvic and breast  portions of the physical exam    Assessment: 40 y.o. G3P0010 routine annual exam  Plan: Problem List Items Addressed This Visit   None   Visit Diagnoses    Encounter for gynecological  examination without abnormal finding    -  Primary   Breast screening       Relevant Orders   MM 3D SCREEN BREAST BILATERAL   Encounter for surveillance of contraceptive pills          1) Mammogram - recommend yearly screening mammogram.  Mammogram Was ordered today   2) STI screening  was notoffered and therefore not obtained  3) ASCCP guidelines and rational discussed.  Patient opts for every 3 years screening interval  4) Contraception - the patient is currently using  OCP (estrogen/progesterone).  She is happy with her current form of contraception and plans to continue  5) Colonoscopy -- Screening recommended starting at age 66 for average risk individuals, age 83 for individuals deemed at increased risk (including African Americans) and recommended to continue until age 43.  For patient age 49-85 individualized approach is recommended.  Gold standard screening is via colonoscopy, Cologuard screening is an acceptable alternative for patient unwilling or unable to undergo colonoscopy.  "Colorectal cancer screening for average?risk adults: 2018 guideline update from the American Cancer Society"CA: A Cancer Journal for Clinicians: Jul 17, 2016   6) Routine healthcare maintenance including cholesterol, diabetes screening discussed managed by PCP  7) Return in about 1 year (around 03/02/2021) for annual.   03/04/2021, MD, Vena Austria  [facility] 03/02/2020, 3:48 PM

## 2020-03-02 NOTE — Patient Instructions (Signed)
[facility] [address] [place] [address]  [facility] [place]  [address] [place] [address]  Phone: [phone]  

## 2020-04-03 ENCOUNTER — Other Ambulatory Visit: Payer: Self-pay

## 2020-04-03 ENCOUNTER — Ambulatory Visit
Admission: RE | Admit: 2020-04-03 | Discharge: 2020-04-03 | Disposition: A | Discharge status: Home / Self Care | Payer: Commercial Managed Care - PPO | Source: Ambulatory Visit | Attending: Obstetrics and Gynecology | Admitting: Obstetrics and Gynecology

## 2020-04-03 DIAGNOSIS — Z1231 Encounter for screening mammogram for malignant neoplasm of breast: Secondary | ICD-10-CM | POA: Insufficient documentation

## 2020-04-03 DIAGNOSIS — Z1239 Encounter for other screening for malignant neoplasm of breast: Secondary | ICD-10-CM

## 2020-04-13 ENCOUNTER — Other Ambulatory Visit: Payer: Self-pay | Admitting: Obstetrics and Gynecology

## 2020-04-13 NOTE — Telephone Encounter (Signed)
Please advise 

## 2020-05-01 ENCOUNTER — Other Ambulatory Visit: Payer: Self-pay | Admitting: Obstetrics and Gynecology

## 2020-05-01 MED ORDER — NITROFURANTOIN MONOHYD MACRO 100 MG PO CAPS: 100.0000 mg | ORAL_CAPSULE | Freq: Two times a day (BID) | ORAL | 0 refills | Status: AC

## 2020-05-16 ENCOUNTER — Other Ambulatory Visit: Payer: Self-pay | Admitting: Obstetrics and Gynecology

## 2020-05-16 MED ORDER — NITROFURANTOIN MONOHYD MACRO 100 MG PO CAPS: 100.0000 mg | ORAL_CAPSULE | Freq: Two times a day (BID) | ORAL | 0 refills | Status: AC

## 2020-10-31 ENCOUNTER — Telehealth: Payer: Self-pay

## 2020-10-31 NOTE — Telephone Encounter (Signed)
Patient is going to [facility] about eyelid issue

## 2020-10-31 NOTE — Telephone Encounter (Signed)
Copied from CRM [id number]. Topic: General - Other >> Oct 31, 2020 11:26 AM Jaquita Rector A wrote: Reason for CRM: Patient called in to get an appointment needing to be scheduled with Merita Norton. Please call Ph# [phone]

## 2020-11-10 ENCOUNTER — Encounter: Payer: Self-pay | Admitting: Family Medicine

## 2020-11-10 ENCOUNTER — Ambulatory Visit (INDEPENDENT_AMBULATORY_CARE_PROVIDER_SITE_OTHER): Payer: Commercial Managed Care - PPO | Admitting: Family Medicine

## 2020-11-10 ENCOUNTER — Other Ambulatory Visit: Payer: Self-pay

## 2020-11-10 ENCOUNTER — Other Ambulatory Visit (HOSPITAL_COMMUNITY)
Admission: RE | Admit: 2020-11-10 | Discharge: 2020-11-10 | Disposition: A | Discharge status: Home / Self Care | Payer: Commercial Managed Care - PPO | Source: Ambulatory Visit | Attending: Family Medicine | Admitting: Family Medicine

## 2020-11-10 VITALS — BP 113/56 | HR 74 | Temp 98.7°F | Resp 16 | Ht 62.0 in | Wt 120.0 lb

## 2020-11-10 DIAGNOSIS — R222 Localized swelling, mass and lump, trunk: Secondary | ICD-10-CM | POA: Diagnosis not present

## 2020-11-10 DIAGNOSIS — R21 Rash and other nonspecific skin eruption: Secondary | ICD-10-CM | POA: Diagnosis not present

## 2020-11-10 DIAGNOSIS — A599 Trichomoniasis, unspecified: Secondary | ICD-10-CM

## 2020-11-10 MED ORDER — AZITHROMYCIN 250 MG PO TABS: ORAL_TABLET | ORAL | 0 refills | Status: AC

## 2020-11-10 MED ORDER — KETOCONAZOLE 2 % EX CREA: 1.0000 "application " | TOPICAL_CREAM | Freq: Every day | CUTANEOUS | 0 refills | Status: DC

## 2020-11-10 NOTE — Progress Notes (Signed)
Established patient visit   Patient: Carolyn Johnston   DOB: Aug 06, 1980   40 y.o. Female  MRN: [id number] Visit Date: 11/10/2020  Today's healthcare provider: Mila Merry, MD   Chief Complaint  Patient presents with   Exposure to STD   Rash   Mass   Subjective    Rash eyelid. This is a recurrent (patient was treated by [facility]) problem. The current episode started 1 to 2 weeks ago. The problem is unchanged. The affected locations include the right eye. The rash is characterized by burning, dryness, redness and itchiness. She was exposed to nothing. Pertinent negatives include no diarrhea, fatigue, shortness of breath or vomiting. She was treated at [facility]gent Care with course of Medrol and cephalexin. She states rash nearly cleared, but flared back up after finished medications.     Follow up for STD  The patient was last seen for this 2 weeks ago. Changes made at last visit include treated for trich with Tinidazole 500mg  at [facility].  She reports excellent compliance with treatment. She feels that condition is Improved. She is not having side effects.   ----------------------------------------------------------------------------------------- Patient C/O lump near right side of navel area 3-4 days ago. Patient reports having some bloating and pain. Patient denies nausea, vomiting, or diarrhea. Patient reports she does have some constipation, but it feels like it is just under the surface of the skin and is sometimes sore.     Medications: Outpatient Medications Prior to Visit  Medication Sig   amitriptyline (ELAVIL) 25 MG tablet Take 50 mg by mouth at bedtime.   norgestimate-ethinyl estradiol (SPRINTEC 28) 0.25-35 MG-MCG tablet TAKE 1 TABLET BY MOUTH EVERY DAY -SKIP PLACEBO TABLETS   tretinoin (RETIN-A) 0.05 % cream Apply 1 application topically at bedtime.   UBRELVY 100 MG TABS Take by mouth.   valACYclovir (VALTREX) 1000 MG tablet TAKE 1 TABLET BY MOUTH EVERY DAY    clindamycin-benzoyl peroxide (BENZACLIN) gel Apply 1 application topically every morning.   EMGALITY 120 MG/ML SOSY    [DISCONTINUED] AIMOVIG 140 MG/ML SOAJ Inject into the skin.   [DISCONTINUED] EMGALITY 120 MG/ML SOAJ SMARTSIG:120 Milligram(s) SUB-Q Every 4 Weeks   No facility-administered medications prior to visit.    Review of Systems  Constitutional:  Negative for activity change, appetite change and fatigue.  Respiratory:  Negative for shortness of breath.   Cardiovascular:  Negative for chest pain and palpitations.  Gastrointestinal:  Positive for abdominal distention, abdominal pain and constipation. Negative for blood in stool, diarrhea, nausea and vomiting.  Genitourinary:  Negative for dysuria.  Skin:  Positive for rash.      Objective    BP (!) 113/56 (BP Location: Left Arm, Patient Position: Sitting, Cuff Size: Normal)   Pulse 74   Temp 98.7 F (37.1 C) (Oral)   Resp 16   Ht 5\' 2"  (1.575 m)   Wt 120 lb (54.4 kg)   SpO2 100%   BMI 21.95 kg/m  {Show previous vital signs (optional):23777}  Physical Exam   Several very small oval red macular lesion of right upper eyelid. No discharge.   No masses or tenderness of abdomen where she reports she has been having intermittent pains.     Assessment & Plan     1. Rash Right upper eyelid rash improved briefly with prednisone and zpack prescribed at Urgent Care, but is now flared back up. It does have the appearance of tinea. Will try- ketoconazole (NIZORAL) 2 % cream; Apply 1  application topically daily.  Dispense: 15 g; Refill: 0  2. Trichomonosis Symptoms resolved after treatment with tinidazole but she would like test of cure.   3. Abdominal wall lump Not appreciated on exam today, but her description is c/w subcutaneous lesion, possible a  sebaceious cyst. If it flares back up she can take azithromycin (ZITHROMAX) 250 MG tablet; Take 2 tablets on day 1, then 1 tablet daily on days 2 through 5  Dispense: 6 tablet;  Refill: 0       The entirety of the information documented in the History of Present Illness, Review of Systems and Physical Exam were personally obtained by me. Portions of this information were initially documented by the CMA and reviewed by me for thoroughness and accuracy.     Mila Merry, MD  [facility] [phone] (phone) [phone] (fax)  [facility]

## 2020-11-10 NOTE — Patient Instructions (Signed)
Preventive Care 40-41 Years Old, Female Preventive care refers to lifestyle choices and visits with your health care provider that can promote health and wellness. This includes: A yearly physical exam. This is also called an annual wellness visit. Regular dental and eye exams. Immunizations. Screening for certain conditions. Healthy lifestyle choices, such as: Eating a healthy diet. Getting regular exercise. Not using drugs or products that contain nicotine and tobacco. Limiting alcohol use. What can I expect for my preventive care visit? Physical exam Your health care provider will check your: Height and weight. These may be used to calculate your BMI (body mass index). BMI is a measurement that tells if you are at a healthy weight. Heart rate and blood pressure. Body temperature. Skin for abnormal spots. Counseling Your health care provider may ask you questions about your: Past medical problems. Family's medical history. Alcohol, tobacco, and drug use. Emotional well-being. Home life and relationship well-being. Sexual activity. Diet, exercise, and sleep habits. Work and work environment. Access to firearms. Method of birth control. Menstrual cycle. Pregnancy history. What immunizations do I need? Vaccines are usually given at various ages, according to a schedule. Your health care provider will recommend vaccines for you based on your age, medical history, and lifestyle or other factors, such as travel or where you work. What tests do I need? Blood tests Lipid and cholesterol levels. These may be checked every 5 years, or more often if you are over 50 years old. Hepatitis C test. Hepatitis B test. Screening Lung cancer screening. You may have this screening every year starting at age 55 if you have a 30-pack-year history of smoking and currently smoke or have quit within the past 15 years. Colorectal cancer screening. All adults should have this screening starting at  age 50 and continuing until age 75. Your health care provider may recommend screening at age 45 if you are at increased risk. You will have tests every 1-10 years, depending on your results and the type of screening test. Diabetes screening. This is done by checking your blood sugar (glucose) after you have not eaten for a while (fasting). You may have this done every 1-3 years. Mammogram. This may be done every 1-2 years. Talk with your health care provider about when you should start having regular mammograms. This may depend on whether you have a family history of breast cancer. BRCA-related cancer screening. This may be done if you have a family history of breast, ovarian, tubal, or peritoneal cancers. Pelvic exam and Pap test. This may be done every 3 years starting at age 21. Starting at age 30, this may be done every 5 years if you have a Pap test in combination with an HPV test. Other tests STD (sexually transmitted disease) testing, if you are at risk. Bone density scan. This is done to screen for osteoporosis. You may have this scan if you are at high risk for osteoporosis. Talk with your health care provider about your test results, treatment options, and if necessary, the need for more tests. Follow these instructions at home: Eating and drinking  Eat a diet that includes fresh fruits and vegetables, whole grains, lean protein, and low-fat dairy products. Take vitamin and mineral supplements as recommended by your health care provider. Do not drink alcohol if: Your health care provider tells you not to drink. You are pregnant, may be pregnant, or are planning to become pregnant. If you drink alcohol: Limit how much you have to 0-1 drink a day. Be   aware of how much alcohol is in your drink. In the [place] one drink equals one 12 oz bottle of beer (355 mL), one 5 oz glass of wine (148 mL), or one 1 oz glass of hard liquor (44 mL). Lifestyle Take daily care of your teeth and  gums. Brush your teeth every morning and night with fluoride toothpaste. Floss one time each day. Stay active. Exercise for at least 30 minutes 5 or more days each week. Do not use any products that contain nicotine or tobacco, such as cigarettes, e-cigarettes, and chewing tobacco. If you need help quitting, ask your health care provider. Do not use drugs. If you are sexually active, practice safe sex. Use a condom or other form of protection to prevent STIs (sexually transmitted infections). If you do not wish to become pregnant, use a form of birth control. If you plan to become pregnant, see your health care provider for a prepregnancy visit. If told by your health care provider, take low-dose aspirin daily starting at age 63. Find healthy ways to cope with stress, such as: Meditation, yoga, or listening to music. Journaling. Talking to a trusted person. Spending time with friends and family. Safety Always wear your seat belt while driving or riding in a vehicle. Do not drive: If you have been drinking alcohol. Do not ride with someone who has been drinking. When you are tired or distracted. While texting. Wear a helmet and other protective equipment during sports activities. If you have firearms in your house, make sure you follow all gun safety procedures. What's next? Visit your health care provider once a year for an annual wellness visit. Ask your health care provider how often you should have your eyes and teeth checked. Stay up to date on all vaccines. This information is not intended to replace advice given to you by your health care provider. Make sure you discuss any questions you have with your health care provider. Document Revised: 04/14/2020 Document Reviewed: 10/16/2017 Elsevier Patient Education  2022 [employer].

## 2020-11-14 LAB — URINE CYTOLOGY ANCILLARY ONLY
Chlamydia: NEGATIVE
Comment: NEGATIVE
Comment: NEGATIVE
Comment: NORMAL
Neisseria Gonorrhea: NEGATIVE
Trichomonas: NEGATIVE

## 2021-01-16 ENCOUNTER — Ambulatory Visit: Payer: Commercial Managed Care - PPO | Admitting: Family Medicine

## 2021-03-20 ENCOUNTER — Other Ambulatory Visit: Payer: Self-pay | Admitting: Obstetrics and Gynecology

## 2021-03-21 ENCOUNTER — Encounter: Payer: Self-pay | Admitting: Adult Health

## 2021-03-21 ENCOUNTER — Ambulatory Visit (INDEPENDENT_AMBULATORY_CARE_PROVIDER_SITE_OTHER): Payer: Commercial Managed Care - PPO | Admitting: Adult Health

## 2021-03-21 ENCOUNTER — Other Ambulatory Visit: Payer: Self-pay

## 2021-03-21 VITALS — BP 132/70 | HR 89 | Temp 98.3°F | Resp 14 | Ht 62.0 in | Wt 128.2 lb

## 2021-03-21 DIAGNOSIS — Z1231 Encounter for screening mammogram for malignant neoplasm of breast: Secondary | ICD-10-CM | POA: Diagnosis not present

## 2021-03-21 DIAGNOSIS — M5481 Occipital neuralgia: Secondary | ICD-10-CM

## 2021-03-21 DIAGNOSIS — E559 Vitamin D deficiency, unspecified: Secondary | ICD-10-CM | POA: Diagnosis not present

## 2021-03-21 DIAGNOSIS — G43809 Other migraine, not intractable, without status migrainosus: Secondary | ICD-10-CM

## 2021-03-21 DIAGNOSIS — L659 Nonscarring hair loss, unspecified: Secondary | ICD-10-CM

## 2021-03-21 DIAGNOSIS — Z3009 Encounter for other general counseling and advice on contraception: Secondary | ICD-10-CM

## 2021-03-21 DIAGNOSIS — B009 Herpesviral infection, unspecified: Secondary | ICD-10-CM

## 2021-03-21 DIAGNOSIS — R519 Headache, unspecified: Secondary | ICD-10-CM

## 2021-03-21 MED ORDER — VALACYCLOVIR HCL 1 G PO TABS: 1000.0000 mg | ORAL_TABLET | Freq: Every day | ORAL | 3 refills | Status: DC

## 2021-03-21 MED ORDER — NORGESTIMATE-ETH ESTRADIOL 0.25-35 MG-MCG PO TABS: ORAL_TABLET | ORAL | 4 refills | Status: DC

## 2021-03-21 NOTE — Progress Notes (Addendum)
New Patient Office Visit  Subjective:  Patient ID: Carolyn Johnston, female    DOB: 1980/04/03  Age: 41 y.o. MRN: [id number]  CC:  Chief Complaint  Patient presents with   Transitions Of Care    Former Dr.Fisher patient, discuss hair loss and low hormone levels. Discuss about being referred to Endocrinology.    HPI Carolyn Melnickricila N Yarbro presents for transfer of care.  She previously seen Leighton RoachAdrianna Pollack PA.  She saw Dr. Sherrie MustacheFisher once.    She is very concerned with her hair loss.  She is having hair loss, she has loss her hair on lower arms, hair loss on head. She has seen dermatology for and was told female patterned hair loss. Dermatology advised her to see Endocrinology due to hair loss.  Cytology - PAP Order: [id number] Status: Edited Result - FINAL    Visible to patient: Yes (seen)    Next appt: None    Dx: Screening for malignant neoplasm of c...    0 Result Notes   1 HM Topic    Component 3 yr ago  Adequacy Satisfactory for evaluation  endocervical/transformation zone component PRESENT.   Diagnosis NEGATIVE FOR INTRAEPITHELIAL LESIONS OR MALIGNANCY.   HPV NOT DETECTED   Comment: Normal Reference Range - NOT Detected  Material Submitted CervicoVaginal Pap [ThinPrep Imaged]   Resulting Agency [facility]         Specimen Collected: 10/13/17 00:00       Patient  denies any fever, body aches,chills, rash, chest pain, shortness of breath, nausea, vomiting, or diarrhea.  Denies dizziness, lightheadedness, pre syncopal or syncopal episodes.   Past Medical History:  Diagnosis Date   Allergy    trees, dust mites   Migraines     Past Surgical History:  Procedure Laterality Date   adnoids suture     DILATION AND CURETTAGE OF UTERUS     TONSILLECTOMY     WISDOM TOOTH EXTRACTION      Family History  Problem Relation Age of Onset   Arthritis Mother    Headache Mother    Fibroids Mother    Drug abuse Father    Diabetes Maternal Grandmother    Parkinson's disease  Maternal Grandfather     Social History   Socioeconomic History   Marital status: Single    Spouse name: Not on file   Number of children: Not on file   Years of education: Not on file   Highest education level: Not on file  Occupational History   Not on file  Tobacco Use   Smoking status: Former    Types: Cigarettes    Quit date: 06/29/2008    Years since quitting: 12.7   Smokeless tobacco: Never  Vaping Use   Vaping Use: Never used  Substance and Sexual Activity   Alcohol use: No   Drug use: No   Sexual activity: Yes    Birth control/protection: Pill  Other Topics Concern   Not on file  Social History Narrative   Not on file   Social Determinants of Health   Financial Resource Strain: Not on file  Food Insecurity: Not on file  Transportation Needs: Not on file  Physical Activity: Not on file  Stress: Not on file  Social Connections: Not on file  Intimate Partner Violence: Not on file    ROS Review of Systems  Reason unable to perform ROS: hair loss on head and lower arms per patient she saw dermatology and they asked her to be  seen by endocrinologist.  Constitutional:  Positive for fatigue. Negative for activity change, appetite change, chills, diaphoresis, fever and unexpected weight change.  HENT: Negative.    Respiratory: Negative.    Cardiovascular: Negative.   Gastrointestinal: Negative.   Genitourinary: Negative.   Musculoskeletal: Negative.   Skin: Negative.   Neurological: Negative.   Hematological: Negative.   Psychiatric/Behavioral: Negative.     Objective:   Today's Vitals: BP 132/70 (BP Location: Left Arm, Patient Position: Sitting, Cuff Size: Small)    Pulse 89    Temp 98.3 F (36.8 C) (Oral)    Resp 14    Ht 5\' 2"  (1.575 m)    Wt 128 lb 3.2 oz (58.2 kg)    SpO2 98%    BMI 23.45 kg/m   Physical Exam  General: Appearance:    Well developed, well nourished female in no acute distress  Eyes:    PERRL, conjunctiva/corneas clear, EOM's intact        Lungs:     Clear to auscultation bilaterally, respirations unlabored  Heart:    Normal heart rate. Normal rhythm. No murmurs, rubs, or gallops.    MS:   All extremities are intact.    Neurologic:   Awake, alert, oriented x 3. No apparent focal neurological           defect.     Assessment & Plan:   Problem List Items Addressed This Visit       Cardiovascular and Mediastinum   Vestibular migraine    Followed by neurology stable and chronic.         Other   Vitamin D insufficiency    Check lab       Relevant Orders   VITAMIN D 25 Hydroxy (Vit-D Deficiency, Fractures) (Completed)   Hair loss    Chronic hair loss of hair on head and lower arms. Fatigue. Wants labs and asks to see endocrinologist, advised dermatologist advised her to see endocrinology.       Relevant Orders   Luteinizing hormone (Completed)   Follicle stimulating hormone (Completed)   Estrogens, Total   Testos,Total,Free and SHBG (Female)   Iron, TIBC and Ferritin Panel (Completed)   Methylmalonic Acid   CBC with Differential/Platelet (Completed)   Comprehensive metabolic panel (Completed)   Prolactin (Completed)   TSH (Completed)   T3 (Completed)   T4 (Completed)   Ambulatory referral to Endocrinology   Parathyroid hormone, intact (no Ca) (Completed)   B12 and Folate Panel (Completed)   Birth control counseling - Primary    Has been on Sprintec . Needs PAP, not smoking. No history of of blood clots. HCG negative.       Relevant Medications   norgestimate-ethinyl estradiol (SPRINTEC 28) 0.25-35 MG-MCG tablet   Herpes    No active infection on valtrex daily. Has had cold sores mostly and questions       Relevant Medications   valACYclovir (VALTREX) 1000 MG tablet   Other Relevant Orders   HSV(herpes simplex vrs) 1+2 ab-IgG (Completed)   HSV 1/2 Ab (IgM), IFA w/rflx Titer   Recurrent occipital headache (Chronic)    Denies any symptoms today's visit.       Bilateral occipital neuralgia  (Chronic)    Followed by neurology denies any worsening changes. Stable.        Outpatient Encounter Medications as of 03/21/2021  Medication Sig   amitriptyline (ELAVIL) 25 MG tablet Take 50 mg by mouth at bedtime.   EMGALITY 120 MG/ML SOSY  tretinoin (RETIN-A) 0.05 % cream Apply 1 application topically at bedtime.   UBRELVY 100 MG TABS Take by mouth.   [DISCONTINUED] norgestimate-ethinyl estradiol (SPRINTEC 28) 0.25-35 MG-MCG tablet TAKE 1 TABLET BY MOUTH EVERY DAY -SKIP PLACEBO TABLETS   [DISCONTINUED] valACYclovir (VALTREX) 1000 MG tablet TAKE 1 TABLET BY MOUTH EVERY DAY   clindamycin-benzoyl peroxide (BENZACLIN) gel Apply 1 application topically every morning. (Patient not taking: Reported on 03/21/2021)   ketoconazole (NIZORAL) 2 % cream Apply 1 application topically daily. (Patient not taking: Reported on 03/21/2021)   norgestimate-ethinyl estradiol (SPRINTEC 28) 0.25-35 MG-MCG tablet TAKE 1 TABLET BY MOUTH EVERY DAY - do not take placebo pills   valACYclovir (VALTREX) 1000 MG tablet Take 1 tablet (1,000 mg total) by mouth daily.   No facility-administered encounter medications on file as of 03/21/2021.    Orders Placed This Encounter  Procedures   MM 3D SCREEN BREAST BILATERAL    Standing Status:   Future    Standing Expiration Date:   06/18/2021    Order Specific Question:   Reason for Exam (SYMPTOM  OR DIAGNOSIS REQUIRED)    Answer:   screening last was 04/03/2020    Order Specific Question:   Is the patient pregnant?    Answer:   No    Order Specific Question:   Preferred imaging location?    Answer:   [facility]   Luteinizing hormone    Standing Status:   Future    Number of Occurrences:   1    Standing Expiration Date:   03/21/2022   Follicle stimulating hormone    Standing Status:   Future    Number of Occurrences:   1    Standing Expiration Date:   03/21/2022   Estrogens, Total    Standing Status:   Future    Number of Occurrences:   1    Standing Expiration Date:    03/21/2022   Testos,Total,Free and SHBG (Female)    Standing Status:   Future    Number of Occurrences:   1    Standing Expiration Date:   03/21/2022   Iron, TIBC and Ferritin Panel    [facility]    Standing Status:   Future    Number of Occurrences:   1    Standing Expiration Date:   03/21/2022   Methylmalonic Acid    Standing Status:   Future    Number of Occurrences:   1    Standing Expiration Date:   03/21/2022   VITAMIN D 25 Hydroxy (Vit-D Deficiency, Fractures)    Standing Status:   Future    Number of Occurrences:   1    Standing Expiration Date:   03/21/2022   CBC with Differential/Platelet    Standing Status:   Future    Number of Occurrences:   1    Standing Expiration Date:   03/21/2022   Comprehensive metabolic panel    Standing Status:   Future    Number of Occurrences:   1    Standing Expiration Date:   03/21/2022   Prolactin    Standing Status:   Future    Number of Occurrences:   1    Standing Expiration Date:   03/21/2022   TSH    Standing Status:   Future    Number of Occurrences:   1    Standing Expiration Date:   03/21/2022   T3    Standing Status:   Future    Number of Occurrences:  1    Standing Expiration Date:   03/21/2022   T4    Standing Status:   Future    Number of Occurrences:   1    Standing Expiration Date:   03/21/2022   Parathyroid hormone, intact (no Ca)    Standing Status:   Future    Number of Occurrences:   1    Standing Expiration Date:   03/21/2022   B12 and Folate Panel    Standing Status:   Future    Number of Occurrences:   1    Standing Expiration Date:   03/21/2022   HSV(herpes simplex vrs) 1+2 ab-IgG    Standing Status:   Future    Number of Occurrences:   1    Standing Expiration Date:   03/21/2022   HSV 1/2 Ab (IgM), IFA w/rflx Titer    Standing Status:   Future    Number of Occurrences:   1    Standing Expiration Date:   03/21/2022   Ambulatory referral to Endocrinology    Referral Priority:   Routine    Referral Type:   Consultation     Referral Reason:   Specialty Services Required    Referred to Provider:   Carlus PavlovGherghe, Cristina, MD    Number of Visits Requested:   1    Red Flags discussed. The patient was given clear instructions to go to ER or return to medical center if any red flags develop, symptoms do not improve, worsen or new problems develop. They verbalized understanding.  Need fasting labs, needs PAP with gynecology or here and compete complete physical examination in the nearest future.  Follow-up: Return in about 3 months (around 06/18/2021), or if symptoms worsen or fail to improve, for at any time for any worsening symptoms, Go to Emergency room/ urgent care if worse.   Jairo BenMichelle Smith Jerrilyn Messinger, FNP

## 2021-03-21 NOTE — Patient Instructions (Addendum)
Call to schedule your screening mammogram. Your orders have been placed for your exam.  Let our office know if you have questions, concerns, or any difficulty scheduling.  If normal results then yearly screening mammograms are recommended unless you notice  Changes in your breast then you should schedule a follow up office visit. If abnormal results  Further imaging will be warranted and sooner follow up as determined by the radiologist at the [facility].  Mammogram not due until after 04/03/21. [facility] at [place], [place] [address]  Main: [phone]     Fatigue If you have fatigue, you feel tired all the time and have a lack of energy or a lack of motivation. Fatigue may make it difficult to start or complete tasks because of exhaustion. In general, occasional or mild fatigue is often a normal response to activity or life. However, long-lasting (chronic) or extreme fatigue may be a symptom of a medical condition. Follow these instructions at home: General instructions Watch your fatigue for any changes. Go to bed and get up at the same time every day. Avoid fatigue by pacing yourself during the day and getting enough sleep at night. Maintain a healthy weight. Medicines Take over-the-counter and prescription medicines only as told by your health care provider. Take a multivitamin, if told by your health care provider.  Do not use herbal or dietary supplements unless they are approved by your health care provider. Activity  Exercise regularly, as told by your health care provider. Use or practice techniques to help you relax, such as yoga, tai chi, meditation, or massage therapy. Eating and drinking  Avoid heavy meals in the evening. Eat a well-balanced diet, which includes lean proteins, whole grains, plenty of fruits and vegetables, and low-fat dairy products. Avoid consuming too much caffeine. Avoid the use of alcohol. Drink  enough fluid to keep your urine pale yellow. Lifestyle Change situations that cause you stress. Try to keep your work and personal schedule in balance. Do not use any products that contain nicotine or tobacco, such as cigarettes and e-cigarettes. If you need help quitting, ask your health care provider. Do not use drugs. Contact a health care provider if: Your fatigue does not get better. You have a fever. You suddenly lose or gain weight. You have headaches. You have trouble falling asleep or sleeping through the night. You feel angry, guilty, anxious, or sad. You are unable to have a bowel movement (constipation). Your skin is dry. You have swelling in your legs or another part of your body. Get help right away if: You feel confused. Your vision is blurry. You feel faint or you pass out. You have a severe headache. You have severe pain in your abdomen, your back, or the area between your waist and hips (pelvis). You have chest pain, shortness of breath, or an irregular or fast heartbeat. You are unable to urinate, or you urinate less than normal. You have abnormal bleeding, such as bleeding from the rectum, vagina, nose, lungs, or nipples. You vomit blood. You have thoughts about hurting yourself or others. If you ever feel like you may hurt yourself or others, or have thoughts about taking your own life, get help right away. You can go to your nearest emergency department or call: Your local emergency services (911 in the [place] A suicide crisis helpline, such as the [place] at 979-025-4428 or 988 in the [place]. This is open 24 hours a  day. Summary If you have fatigue, you feel tired all the time and have a lack of energy or a lack of motivation. Fatigue may make it difficult to start or complete tasks because of exhaustion. Long-lasting (chronic) or extreme fatigue may be a symptom of a medical condition. Exercise regularly, as told by your health care  provider. Change situations that cause you stress. Try to keep your work and personal schedule in balance. This information is not intended to replace advice given to you by your health care provider. Make sure you discuss any questions you have with your health care provider. Document Revised: 08/30/2020 Document Reviewed: 12/16/2019 Elsevier Patient Education  2022 [employer].

## 2021-03-22 ENCOUNTER — Other Ambulatory Visit: Payer: Self-pay | Admitting: Adult Health

## 2021-03-22 ENCOUNTER — Other Ambulatory Visit (INDEPENDENT_AMBULATORY_CARE_PROVIDER_SITE_OTHER): Payer: Commercial Managed Care - PPO

## 2021-03-22 DIAGNOSIS — Z3009 Encounter for other general counseling and advice on contraception: Secondary | ICD-10-CM

## 2021-03-22 DIAGNOSIS — L659 Nonscarring hair loss, unspecified: Secondary | ICD-10-CM | POA: Diagnosis not present

## 2021-03-22 DIAGNOSIS — B009 Herpesviral infection, unspecified: Secondary | ICD-10-CM | POA: Diagnosis not present

## 2021-03-22 DIAGNOSIS — E559 Vitamin D deficiency, unspecified: Secondary | ICD-10-CM

## 2021-03-22 LAB — CBC WITH DIFFERENTIAL/PLATELET
Basophils Absolute: 0 10*3/uL (ref 0.0–0.1)
Basophils Relative: 0.4 % (ref 0.0–3.0)
Eosinophils Absolute: 0 10*3/uL (ref 0.0–0.7)
Eosinophils Relative: 0.6 % (ref 0.0–5.0)
HCT: 39.1 % (ref 36.0–46.0)
Hemoglobin: 12.9 g/dL (ref 12.0–15.0)
Lymphocytes Relative: 27.3 % (ref 12.0–46.0)
Lymphs Abs: 1.8 10*3/uL (ref 0.7–4.0)
MCHC: 33.1 g/dL (ref 30.0–36.0)
MCV: 90.7 fl (ref 78.0–100.0)
Monocytes Absolute: 0.4 10*3/uL (ref 0.1–1.0)
Monocytes Relative: 5.6 % (ref 3.0–12.0)
Neutro Abs: 4.3 10*3/uL (ref 1.4–7.7)
Neutrophils Relative %: 66.1 % (ref 43.0–77.0)
Platelets: 304 10*3/uL (ref 150.0–400.0)
RBC: 4.31 Mil/uL (ref 3.87–5.11)
RDW: 13.1 % (ref 11.5–15.5)
WBC: 6.5 10*3/uL (ref 4.0–10.5)

## 2021-03-22 LAB — TSH: TSH: 0.63 u[IU]/mL (ref 0.35–5.50)

## 2021-03-22 LAB — COMPREHENSIVE METABOLIC PANEL
ALT: 30 U/L (ref 0–35)
AST: 27 U/L (ref 0–37)
Albumin: 4.1 g/dL (ref 3.5–5.2)
Alkaline Phosphatase: 68 U/L (ref 39–117)
BUN: 18 mg/dL (ref 6–23)
CO2: 29 mEq/L (ref 19–32)
Calcium: 9.5 mg/dL (ref 8.4–10.5)
Chloride: 103 mEq/L (ref 96–112)
Creatinine, Ser: 0.87 mg/dL (ref 0.40–1.20)
GFR: 82.68 mL/min (ref >60.00)
Glucose, Bld: 84 mg/dL (ref 70–99)
Potassium: 4.2 mEq/L (ref 3.5–5.1)
Sodium: 139 mEq/L (ref 135–145)
Total Bilirubin: 0.6 mg/dL (ref 0.2–1.2)
Total Protein: 7.3 g/dL (ref 6.0–8.3)

## 2021-03-22 LAB — FOLLICLE STIMULATING HORMONE: FSH: 0.1 m[IU]/mL

## 2021-03-22 LAB — VITAMIN D 25 HYDROXY (VIT D DEFICIENCY, FRACTURES): VITD: 39.23 ng/mL (ref 30.00–100.00)

## 2021-03-22 LAB — LUTEINIZING HORMONE: LH: 0.18 m[IU]/mL

## 2021-03-22 LAB — B12 AND FOLATE PANEL
Folate: >24.2 ng/mL (ref >5.9)
Vitamin B-12: 219 pg/mL (ref 211–911)

## 2021-03-23 ENCOUNTER — Other Ambulatory Visit (INDEPENDENT_AMBULATORY_CARE_PROVIDER_SITE_OTHER): Payer: Commercial Managed Care - PPO

## 2021-03-23 DIAGNOSIS — Z3009 Encounter for other general counseling and advice on contraception: Secondary | ICD-10-CM

## 2021-03-23 LAB — HCG, QUANTITATIVE, PREGNANCY: Quantitative HCG: <0.6 m[IU]/mL

## 2021-03-23 NOTE — Progress Notes (Signed)
Negative pregnancy test

## 2021-03-24 ENCOUNTER — Encounter: Payer: Self-pay | Admitting: Adult Health

## 2021-03-24 DIAGNOSIS — L659 Nonscarring hair loss, unspecified: Secondary | ICD-10-CM | POA: Insufficient documentation

## 2021-03-24 DIAGNOSIS — Z3009 Encounter for other general counseling and advice on contraception: Secondary | ICD-10-CM | POA: Insufficient documentation

## 2021-03-24 DIAGNOSIS — B009 Herpesviral infection, unspecified: Secondary | ICD-10-CM | POA: Insufficient documentation

## 2021-03-24 NOTE — Assessment & Plan Note (Signed)
Has been on Sprintec . Needs PAP, not smoking. No history of of blood clots. HCG negative.

## 2021-03-24 NOTE — Assessment & Plan Note (Signed)
Followed by neurology denies any worsening changes. Stable.

## 2021-03-24 NOTE — Assessment & Plan Note (Signed)
Chronic hair loss of hair on head and lower arms. Fatigue. Wants labs and asks to see endocrinologist, advised dermatologist advised her to see endocrinology.

## 2021-03-24 NOTE — Assessment & Plan Note (Signed)
Check lab

## 2021-03-24 NOTE — Assessment & Plan Note (Signed)
No active infection on valtrex daily. Has had cold sores mostly and questions

## 2021-03-24 NOTE — Assessment & Plan Note (Signed)
Followed by neurology stable and chronic.

## 2021-03-24 NOTE — Assessment & Plan Note (Signed)
Denies any symptoms today's visit.

## 2021-03-25 ENCOUNTER — Other Ambulatory Visit: Payer: Self-pay | Admitting: Adult Health

## 2021-03-25 DIAGNOSIS — R7989 Other specified abnormal findings of blood chemistry: Secondary | ICD-10-CM

## 2021-03-25 DIAGNOSIS — E559 Vitamin D deficiency, unspecified: Secondary | ICD-10-CM

## 2021-03-25 DIAGNOSIS — E538 Deficiency of other specified B group vitamins: Secondary | ICD-10-CM

## 2021-03-25 MED ORDER — VITAMIN D (ERGOCALCIFEROL) 1.25 MG (50000 UNIT) PO CAPS: 50000.0000 [IU] | ORAL_CAPSULE | ORAL | 0 refills | Status: DC

## 2021-03-25 MED ORDER — CYANOCOBALAMIN 1000 MCG/ML IJ SOLN: 1000.0000 ug | Freq: Once | INTRAMUSCULAR | Status: DC

## 2021-03-25 NOTE — Progress Notes (Signed)
Orders Placed This Encounter  Procedures   TSH    Standing Status:   Future    Standing Expiration Date:   03/25/2022   VITAMIN D 25 Hydroxy (Vit-D Deficiency, Fractures)    Standing Status:   Future    Standing Expiration Date:   03/25/2022   B12    Standing Status:   Future    Standing Expiration Date:   03/25/2022     B12 deficiency - Plan: B12, cyanocobalamin ((VITAMIN B-12)) injection 1,000 mcg  Vitamin D deficiency - Plan: Vitamin D, Ergocalciferol, (DRISDOL) 1.25 MG (50000 UNIT) CAPS capsule, VITAMIN D 25 Hydroxy (Vit-D Deficiency, Fractures)  Low serum thyroid stimulating hormone (TSH) - Plan: TSH  Meds ordered this encounter  Medications   Vitamin D, Ergocalciferol, (DRISDOL) 1.25 MG (50000 UNIT) CAPS capsule    Sig: Take 1 capsule (50,000 Units total) by mouth every 7 (seven) days. (taking one tablet per week)    Dispense:  12 capsule    Refill:  0   cyanocobalamin ((VITAMIN B-12)) injection 1,000 mcg

## 2021-03-25 NOTE — Progress Notes (Signed)
° ° ° ° °  HSV 1 and HSV 2 past infection is noted by labs.  PTH parathyroid hormone is within normal limits.  T4 within normal limits . T3 within normal limits. TSH within normal limits- low end normal, recommend checking again in 3 months TSH.  Prolactin level is within normal limits.  Iron studies within normal limits.  CBC and CMP is within normal limits.  FSH and LH  is low, she is on oral contraceptive, HCG pregnancy is negative.   Vitamin  D is low, this can contribute to poor sleep and fatigue, will send in prescription for Vitamin D at 50,000 units by mouth once every 7 days/(once weekly) for 12 weeks. Advise recheck lab Vitamin D in 1-2 weeks after completing vitamin d prescription. Labs need to be scheduled.   b12 deficiency diagnosed. patient asked to return for B12 injections once weekly for 4 weeks and once a month afterwards for 3 months.   Please add TSH, vitamin B 12, and Vitamin D labs to recheck in 3 months.  Schedule b12 injection if patient desires.    Other labs still pending.  Recommend gynecology follow up.   So far endocrinology has declined patients request referral at this time, I have sent a message to Dr. Lafe Garin to see if she will see patient, waiting on response, does patient prefer another location ?

## 2021-03-26 ENCOUNTER — Telehealth: Payer: Self-pay

## 2021-03-26 ENCOUNTER — Other Ambulatory Visit: Payer: Self-pay

## 2021-03-26 ENCOUNTER — Other Ambulatory Visit: Payer: Self-pay | Admitting: Adult Health

## 2021-03-26 DIAGNOSIS — E538 Deficiency of other specified B group vitamins: Secondary | ICD-10-CM

## 2021-03-26 MED ORDER — CYANOCOBALAMIN 1000 MCG/ML IJ SOLN
1000.0000 ug | Freq: Once | INTRAMUSCULAR | Status: AC
  Administered 2021-04-05: 1000 ug via INTRAMUSCULAR

## 2021-03-26 NOTE — Telephone Encounter (Signed)
Lvm for pt to return call in regards to labs.  ?

## 2021-03-26 NOTE — Progress Notes (Signed)
Orders Placed This Encounter  Procedures   B12 and Folate Panel    Standing Status:   Future    Standing Expiration Date:   03/26/2022   B12 deficiency - Plan: cyanocobalamin ((VITAMIN B-12)) injection 1,000 mcg, B12 and Folate Panel   Meds ordered this encounter  Medications   cyanocobalamin ((VITAMIN B-12)) injection 1,000 mcg   cyanocobalamin ((VITAMIN B-12)) injection 1,000 mcg   [id number]  Ordered Dose: 1,000 mcg Route: Intramuscular Frequency:  Once  Admin Dose: 1,000 mcg     Scheduled Start Date/Time: 03/28/21 0000 End Date/Time: No end date specified (ordered for 1 doses)     Admin Instructions:  1000 mcg (1 mL) intramuscular injection in the thigh ( vastus lateralis) once per week for four weeks, followed by 1000 mcg IM injection once per month for 3 months.      Indications of Use: Vitamin B12 Deficiency, hair loss     Diagnosis Association: B12 deficiency (E53.8)    Order Status: Active  Ordering User: Doreen Beam, FNP Ordering Date/Time: Mon Mar 26, 2021 1311  Ordering Provider: Doreen Beam, FNP Authorizing Provider: Doreen Beam, FNP

## 2021-03-26 NOTE — Telephone Encounter (Signed)
Pt returning call

## 2021-03-28 LAB — PROLACTIN: Prolactin: 6.1 ng/mL

## 2021-03-28 LAB — IRON,TIBC AND FERRITIN PANEL
%SAT: 31 % (calc) (ref 16–45)
Ferritin: 79 ng/mL (ref 16–232)
Iron: 111 ug/dL (ref 40–190)
TIBC: 361 mcg/dL (calc) (ref 250–450)

## 2021-03-28 LAB — ESTROGENS, TOTAL: Estrogen: 166.1 pg/mL

## 2021-03-28 LAB — HSV(HERPES SIMPLEX VRS) I + II AB-IGG
HAV 1 IGG,TYPE SPECIFIC AB: 7.74 index — ABNORMAL HIGH
HSV 2 IGG,TYPE SPECIFIC AB: >23 index — ABNORMAL HIGH

## 2021-03-28 LAB — PARATHYROID HORMONE, INTACT (NO CA): PTH: 46 pg/mL (ref 16–77)

## 2021-03-28 LAB — T3: T3, Total: 176 ng/dL (ref 76–181)

## 2021-03-28 LAB — METHYLMALONIC ACID, SERUM: Methylmalonic Acid, Quant: 228 nmol/L (ref 87–318)

## 2021-03-28 LAB — T4: T4, Total: 9.6 ug/dL (ref 5.1–11.9)

## 2021-03-28 NOTE — Progress Notes (Signed)
No active IGM HSV 1 or 2 herpes simplex infection per lab.

## 2021-03-29 ENCOUNTER — Encounter: Payer: Self-pay | Admitting: Emergency Medicine

## 2021-03-29 ENCOUNTER — Other Ambulatory Visit: Payer: Self-pay

## 2021-03-29 ENCOUNTER — Ambulatory Visit
Admission: EM | Admit: 2021-03-29 | Discharge: 2021-03-29 | Disposition: A | Discharge status: Home / Self Care | Payer: Commercial Managed Care - PPO | Attending: Physician Assistant | Admitting: Physician Assistant

## 2021-03-29 ENCOUNTER — Ambulatory Visit (INDEPENDENT_AMBULATORY_CARE_PROVIDER_SITE_OTHER): Payer: Commercial Managed Care - PPO

## 2021-03-29 DIAGNOSIS — R109 Unspecified abdominal pain: Secondary | ICD-10-CM | POA: Insufficient documentation

## 2021-03-29 DIAGNOSIS — E538 Deficiency of other specified B group vitamins: Secondary | ICD-10-CM | POA: Diagnosis not present

## 2021-03-29 LAB — POCT URINALYSIS DIP (MANUAL ENTRY)
Bilirubin, UA: NEGATIVE
Blood, UA: NEGATIVE
Glucose, UA: NEGATIVE mg/dL
Ketones, POC UA: NEGATIVE mg/dL
Nitrite, UA: NEGATIVE
Protein Ur, POC: NEGATIVE mg/dL
Spec Grav, UA: 1.015 (ref 1.010–1.025)
Urobilinogen, UA: 0.2 E.U./dL
pH, UA: 8.5 — AB (ref 5.0–8.0)

## 2021-03-29 LAB — HSV 1/2 AB (IGM), IFA W/RFLX TITER
HSV 1 IgM Screen: NEGATIVE
HSV 2 IgM Screen: NEGATIVE

## 2021-03-29 LAB — TESTOS,TOTAL,FREE AND SHBG (FEMALE)
Free Testosterone: 0.7 pg/mL (ref 0.1–6.4)
Sex Hormone Binding: 152 nmol/L — ABNORMAL HIGH (ref 17–124)
Testosterone, Total, LC-MS-MS: 12 ng/dL (ref 2–45)

## 2021-03-29 MED ORDER — DICLOFENAC SODIUM 75 MG PO TBEC: 75.0000 mg | DELAYED_RELEASE_TABLET | Freq: Two times a day (BID) | ORAL | 0 refills | Status: DC

## 2021-03-29 MED ORDER — CYANOCOBALAMIN 1000 MCG/ML IJ SOLN
1000.0000 ug | Freq: Once | INTRAMUSCULAR | Status: AC
  Administered 2021-03-29: 1000 ug via INTRAMUSCULAR

## 2021-03-29 MED ORDER — METHOCARBAMOL 500 MG PO TABS: 500.0000 mg | ORAL_TABLET | Freq: Four times a day (QID) | ORAL | 0 refills | Status: DC

## 2021-03-29 NOTE — ED Triage Notes (Signed)
Pt c/o left side flank pain x 1 week

## 2021-03-29 NOTE — Progress Notes (Signed)
Testosterone levels ok. Sex binding hormone elevated some.  Endocrinology declined referral. If you would like to see [facility] in [place] I believe they still do some saliva testing of hormones, I can place a referral there just let me know they do gynecology.

## 2021-03-29 NOTE — Discharge Instructions (Addendum)
Your urine culture is pending.  See your Physician for recheck next week

## 2021-03-29 NOTE — ED Provider Notes (Signed)
Renaldo Fiddler    CSN: [id number] Arrival date & time: 03/29/21  1602      History   Chief Complaint Chief Complaint  Patient presents with   Flank Pain    HPI Carolyn Johnston is a 41 y.o. female.   The history is provided by the patient. No language interpreter was used.  Flank Pain This is a new problem. The current episode started more than 1 week ago. The problem occurs constantly. The problem has not changed since onset.Pertinent negatives include no abdominal pain. Nothing aggravates the symptoms. Nothing relieves the symptoms.   Past Medical History:  Diagnosis Date   Allergy    trees, dust mites   Migraines     Patient Active Problem List   Diagnosis Date Noted   Hair loss 03/24/2021   Birth control counseling 03/24/2021   Herpes 03/24/2021   Vitamin D insufficiency 06/06/2016   Recurrent occipital headache 04/22/2016   Bilateral occipital neuralgia 04/22/2016   Cervico-occipital neuralgia of right side 04/22/2016   Cervico-occipital neuralgia of left side 04/22/2016   Chronic pain syndrome 04/22/2016   Vestibular migraine 06/01/2013    Past Surgical History:  Procedure Laterality Date   adnoids suture     DILATION AND CURETTAGE OF UTERUS     TONSILLECTOMY     WISDOM TOOTH EXTRACTION      OB History     Gravida  3   Para  2   Term      Preterm      AB  1   Living         SAB  1   IAB      Ectopic      Multiple      Live Births               Home Medications    Prior to Admission medications   Medication Sig Start Date End Date Taking? Authorizing Provider  amitriptyline (ELAVIL) 25 MG tablet Take 50 mg by mouth at bedtime.   Yes [provider]  diclofenac (VOLTAREN) 75 MG EC tablet Take 1 tablet (75 mg total) by mouth 2 (two) times daily. 03/29/21  Yes Elson Areas, PA-C  EMGALITY 120 MG/ML SOSY  10/07/17  Yes [provider]  methocarbamol (ROBAXIN) 500 MG tablet Take 1 tablet (500 mg total)  by mouth 4 (four) times daily. 03/29/21  Yes Elson Areas, PA-C  norgestimate-ethinyl estradiol (SPRINTEC 28) 0.25-35 MG-MCG tablet TAKE 1 TABLET BY MOUTH EVERY DAY - do not take placebo pills 03/21/21  Yes Flinchum, Eula Fried, FNP  clindamycin-benzoyl peroxide (BENZACLIN) gel Apply 1 application topically every morning. Patient not taking: Reported on 03/21/2021 02/03/20   [provider]  ketoconazole (NIZORAL) 2 % cream Apply 1 application topically daily. Patient not taking: Reported on 03/21/2021 11/10/20   Malva Limes, MD  tretinoin (RETIN-A) 0.05 % cream Apply 1 application topically at bedtime. 02/05/20   [provider]  UBRELVY 100 MG TABS Take by mouth. 02/04/20   [provider]  valACYclovir (VALTREX) 1000 MG tablet Take 1 tablet (1,000 mg total) by mouth daily. 03/21/21   Flinchum, Eula Fried, FNP  Vitamin D, Ergocalciferol, (DRISDOL) 1.25 MG (50000 UNIT) CAPS capsule Take 1 capsule (50,000 Units total) by mouth every 7 (seven) days. (taking one tablet per week) 03/25/21   Flinchum, Eula Fried, FNP    Family History Family History  Problem Relation Age of Onset   Arthritis Mother  Headache Mother    Fibroids Mother    Drug abuse Father    Diabetes Maternal Grandmother    Parkinson's disease Maternal Grandfather     Social History Social History   Tobacco Use   Smoking status: Former    Types: Cigarettes    Quit date: 06/29/2008    Years since quitting: 12.7   Smokeless tobacco: Never  Vaping Use   Vaping Use: Never used  Substance Use Topics   Alcohol use: No   Drug use: No     Allergies   Patient has no known allergies.   Review of Systems Review of Systems  Gastrointestinal:  Negative for abdominal pain.  Genitourinary:  Positive for flank pain.  All other systems reviewed and are negative.   Physical Exam Triage Vital Signs ED Triage Vitals  Enc Vitals Group     BP 03/29/21 1650 107/77     Pulse Rate 03/29/21 1650 81      Resp 03/29/21 1650 16     Temp 03/29/21 1650 99.1 F (37.3 C)     Temp Source 03/29/21 1650 Oral     SpO2 03/29/21 1650 96 %     Weight --      Height --      Head Circumference --      Peak Flow --      Pain Score 03/29/21 1633 4     Pain Loc --      Pain Edu? --      Excl. in GC? --    No data found.  Updated Vital Signs BP 107/77 (BP Location: Left Arm)    Pulse 81    Temp 99.1 F (37.3 C) (Oral)    Resp 16    SpO2 96%   Visual Acuity Right Eye Distance:   Left Eye Distance:   Bilateral Distance:    Right Eye Near:   Left Eye Near:    Bilateral Near:     Physical Exam Vitals and nursing note reviewed.  Constitutional:      Appearance: She is well-developed.  HENT:     Head: Normocephalic.  Cardiovascular:     Rate and Rhythm: Normal rate.  Pulmonary:     Effort: Pulmonary effort is normal.  Abdominal:     General: Abdomen is flat. There is no distension.  Musculoskeletal:        General: Normal range of motion.     Cervical back: Normal range of motion.  Skin:    General: Skin is warm.  Neurological:     General: No focal deficit present.     Mental Status: She is alert and oriented to person, place, and time.     UC Treatments / Results  Labs (all labs ordered are listed, but only abnormal results are displayed) Labs Reviewed  POCT URINALYSIS DIP (MANUAL ENTRY) - Abnormal; Notable for the following components:      Result Value   Clarity, UA cloudy (*)    pH, UA 8.5 (*)    Leukocytes, UA Trace (*)    All other components within normal limits  URINE CULTURE    EKG   Radiology No results found.  Procedures Procedures (including critical care time)  Medications Ordered in UC Medications - No data to display  Initial Impression / Assessment and Plan / UC Course  I have reviewed the triage vital signs and the nursing notes.  Pertinent labs & imaging results that were available during my care of the  patient were reviewed by me and  considered in my medical decision making (see chart for details).     MDM:  Eustace Pen shows trace of leukocytes, no nitrates, no blood,  urine culture ordered.  I will treat for musculoskeletal pain  Final Clinical Impressions(s) / UC Diagnoses   Final diagnoses:  Left flank pain     Discharge Instructions      Your urine culture is pending.  See your Physician for recheck next week    ED Prescriptions     Medication Sig Dispense Auth. Provider   diclofenac (VOLTAREN) 75 MG EC tablet Take 1 tablet (75 mg total) by mouth 2 (two) times daily. 20 tablet Arris Meyn K, [place]   methocarbamol (ROBAXIN) 500 MG tablet Take 1 tablet (500 mg total) by mouth 4 (four) times daily. 20 tablet Elson Areas, [place]      PDMP not reviewed this encounter. An After Visit Summary was printed and given to the patient.    Elson Areas, [place] 03/29/21 1714

## 2021-03-29 NOTE — Progress Notes (Signed)
Patient came in today for B-12 injection given in left deltoid IM. Patient tolerated well with no signs of distress.   Pt also was hoping to see provider in office for possible UTI or kidney infection. Due to no provider availability & Sharyn Lull not being in office I did advise UC across the street today for immediate care.

## 2021-03-30 ENCOUNTER — Telehealth: Payer: Self-pay

## 2021-03-30 ENCOUNTER — Other Ambulatory Visit: Payer: Self-pay | Admitting: Adult Health

## 2021-03-30 DIAGNOSIS — Z01419 Encounter for gynecological examination (general) (routine) without abnormal findings: Secondary | ICD-10-CM

## 2021-03-30 DIAGNOSIS — Z3009 Encounter for other general counseling and advice on contraception: Secondary | ICD-10-CM

## 2021-03-30 NOTE — Telephone Encounter (Signed)
Lvm for pt to return call in regards to labs.  ?

## 2021-03-30 NOTE — Telephone Encounter (Signed)
Patient returning call please give patient a call back.  °

## 2021-03-30 NOTE — Progress Notes (Signed)
[facility] patient request.

## 2021-03-31 LAB — URINE CULTURE: Culture: 30000 — AB

## 2021-04-05 ENCOUNTER — Other Ambulatory Visit: Payer: Commercial Managed Care - PPO

## 2021-04-05 ENCOUNTER — Other Ambulatory Visit: Payer: Self-pay

## 2021-04-05 ENCOUNTER — Ambulatory Visit (INDEPENDENT_AMBULATORY_CARE_PROVIDER_SITE_OTHER): Payer: Commercial Managed Care - PPO | Admitting: *Deleted

## 2021-04-05 DIAGNOSIS — E538 Deficiency of other specified B group vitamins: Secondary | ICD-10-CM | POA: Diagnosis not present

## 2021-04-05 NOTE — Progress Notes (Signed)
Pt received B12 (2 of 4) in right deltoid. Tolerated it well. No complaints or issues.

## 2021-04-09 ENCOUNTER — Ambulatory Visit: Payer: Commercial Managed Care - PPO | Admitting: Adult Health

## 2021-04-09 ENCOUNTER — Other Ambulatory Visit: Payer: Self-pay | Admitting: Adult Health

## 2021-04-09 DIAGNOSIS — E559 Vitamin D deficiency, unspecified: Secondary | ICD-10-CM

## 2021-04-12 ENCOUNTER — Ambulatory Visit (INDEPENDENT_AMBULATORY_CARE_PROVIDER_SITE_OTHER): Payer: Commercial Managed Care - PPO

## 2021-04-12 ENCOUNTER — Other Ambulatory Visit: Payer: Self-pay

## 2021-04-12 ENCOUNTER — Ambulatory Visit: Payer: Commercial Managed Care - PPO

## 2021-04-12 DIAGNOSIS — E538 Deficiency of other specified B group vitamins: Secondary | ICD-10-CM | POA: Diagnosis not present

## 2021-04-12 MED ORDER — CYANOCOBALAMIN 1000 MCG/ML IJ SOLN
1000.0000 ug | Freq: Once | INTRAMUSCULAR | Status: AC
  Administered 2021-04-12: 1000 ug via INTRAMUSCULAR

## 2021-04-12 NOTE — Progress Notes (Signed)
Patient presented for B 12 injection to left deltoid, patient voiced no concerns nor showed any signs of distress during injection. 

## 2021-04-16 ENCOUNTER — Other Ambulatory Visit: Payer: Commercial Managed Care - PPO

## 2021-04-19 ENCOUNTER — Other Ambulatory Visit: Payer: Self-pay

## 2021-04-19 ENCOUNTER — Ambulatory Visit (INDEPENDENT_AMBULATORY_CARE_PROVIDER_SITE_OTHER): Payer: Commercial Managed Care - PPO | Admitting: *Deleted

## 2021-04-19 DIAGNOSIS — E538 Deficiency of other specified B group vitamins: Secondary | ICD-10-CM

## 2021-04-19 MED ORDER — CYANOCOBALAMIN 1000 MCG/ML IJ SOLN
1000.0000 ug | Freq: Once | INTRAMUSCULAR | Status: AC
  Administered 2021-04-19: 1000 ug via INTRAMUSCULAR

## 2021-04-19 NOTE — Progress Notes (Signed)
Pt arrived for 4/4 B12 injection, given in R deltoid. Pt tolerated injection well, showed no signs of distress nor voiced any concerns.  

## 2021-04-23 ENCOUNTER — Other Ambulatory Visit (INDEPENDENT_AMBULATORY_CARE_PROVIDER_SITE_OTHER): Payer: Commercial Managed Care - PPO

## 2021-04-23 ENCOUNTER — Other Ambulatory Visit: Payer: Self-pay

## 2021-04-23 DIAGNOSIS — R7989 Other specified abnormal findings of blood chemistry: Secondary | ICD-10-CM | POA: Diagnosis not present

## 2021-04-23 DIAGNOSIS — E559 Vitamin D deficiency, unspecified: Secondary | ICD-10-CM | POA: Diagnosis not present

## 2021-04-23 DIAGNOSIS — E538 Deficiency of other specified B group vitamins: Secondary | ICD-10-CM | POA: Diagnosis not present

## 2021-04-23 LAB — VITAMIN D 25 HYDROXY (VIT D DEFICIENCY, FRACTURES): VITD: 60.46 ng/mL (ref 30.00–100.00)

## 2021-04-23 LAB — TSH: TSH: 0.45 u[IU]/mL (ref 0.35–5.50)

## 2021-04-23 LAB — B12 AND FOLATE PANEL
Folate: 21.4 ng/mL (ref >5.9)
Vitamin B-12: 628 pg/mL (ref 211–911)

## 2021-04-23 NOTE — Progress Notes (Signed)
Vitamin D is improved, she can continue Vitamin D at 4,000 IU ( international units) once daily to maintain. Should have vitamin D level checked again in 3- 4 months at lab.  ?B 12 has improved, continue current dose.  ?TSH for thyroid is trending on low side, recommend she see an endocrinologist, only [facility] location accepting new patients at this time, is she in agreement to go ?  ?Ok to place referral.

## 2021-04-24 ENCOUNTER — Other Ambulatory Visit: Payer: Self-pay

## 2021-04-24 ENCOUNTER — Telehealth: Payer: Self-pay

## 2021-04-24 DIAGNOSIS — R7989 Other specified abnormal findings of blood chemistry: Secondary | ICD-10-CM

## 2021-04-24 DIAGNOSIS — L659 Nonscarring hair loss, unspecified: Secondary | ICD-10-CM

## 2021-04-24 NOTE — Telephone Encounter (Signed)
See mychart msg

## 2021-04-27 ENCOUNTER — Other Ambulatory Visit: Payer: Commercial Managed Care - PPO

## 2021-04-30 ENCOUNTER — Ambulatory Visit
Admission: RE | Admit: 2021-04-30 | Discharge: 2021-04-30 | Disposition: A | Discharge status: Home / Self Care | Payer: Commercial Managed Care - PPO | Source: Ambulatory Visit | Attending: Adult Health | Admitting: Adult Health

## 2021-04-30 ENCOUNTER — Other Ambulatory Visit: Payer: Self-pay

## 2021-04-30 DIAGNOSIS — Z1231 Encounter for screening mammogram for malignant neoplasm of breast: Secondary | ICD-10-CM | POA: Insufficient documentation

## 2021-04-30 NOTE — Progress Notes (Signed)
Negative mammogram repeat in one year unless clinically indicated sooner.  ?IMPRESSION: ?No mammographic evidence of malignancy. A result letter of this ?screening mammogram will be mailed directly to the patient. ?? ?RECOMMENDATION: ?Screening mammogram in one year. (Code:SM-B-01Y) ?? ?BI-RADS CATEGORY  1: Negative. ?? ?? ?Electronically Signed ?  By: Beckie Salts M.D. ?  On: 04/30/2021 16:40

## 2021-05-01 ENCOUNTER — Ambulatory Visit (INDEPENDENT_AMBULATORY_CARE_PROVIDER_SITE_OTHER): Payer: Commercial Managed Care - PPO | Admitting: Obstetrics and Gynecology

## 2021-05-01 ENCOUNTER — Telehealth: Payer: Self-pay

## 2021-05-01 ENCOUNTER — Encounter: Payer: Self-pay | Admitting: Obstetrics and Gynecology

## 2021-05-01 ENCOUNTER — Other Ambulatory Visit (HOSPITAL_COMMUNITY)
Admission: RE | Admit: 2021-05-01 | Discharge: 2021-05-01 | Disposition: A | Discharge status: Home / Self Care | Payer: Commercial Managed Care - PPO | Source: Ambulatory Visit | Attending: Obstetrics and Gynecology | Admitting: Obstetrics and Gynecology

## 2021-05-01 VITALS — BP 110/70 | Ht 62.0 in | Wt 126.0 lb

## 2021-05-01 DIAGNOSIS — L659 Nonscarring hair loss, unspecified: Secondary | ICD-10-CM

## 2021-05-01 DIAGNOSIS — Z01419 Encounter for gynecological examination (general) (routine) without abnormal findings: Secondary | ICD-10-CM

## 2021-05-01 DIAGNOSIS — Z1151 Encounter for screening for human papillomavirus (HPV): Secondary | ICD-10-CM

## 2021-05-01 DIAGNOSIS — Z124 Encounter for screening for malignant neoplasm of cervix: Secondary | ICD-10-CM | POA: Insufficient documentation

## 2021-05-01 DIAGNOSIS — Z1231 Encounter for screening mammogram for malignant neoplasm of breast: Secondary | ICD-10-CM

## 2021-05-01 DIAGNOSIS — Z3041 Encounter for surveillance of contraceptive pills: Secondary | ICD-10-CM | POA: Diagnosis not present

## 2021-05-01 MED ORDER — DESOGESTREL-ETHINYL ESTRADIOL 0.15-30 MG-MCG PO TABS: 1.0000 | ORAL_TABLET | Freq: Every day | ORAL | 4 refills | Status: DC

## 2021-05-01 NOTE — Progress Notes (Signed)
? ?PCP:  Doreen Beam, FNP ? ? ?Chief Complaint  ?Patient presents with  ? Gynecologic Exam  ?  No concerns  ? ? ? ?HPI: ?     [name] is a 41 y.o. G3P0010 whose LMP was No LMP recorded. (Menstrual status: Oral contraceptives)., presents today for her annual examination.  Her menses are absent with cont dosing of OCPs, has rare BTB/dysmen.    ? ?Sex activity: not currently sexually active, contraception - OCP (estrogen/progesterone).  ?Last Pap: 10/13/17 Results were: no abnormalities /neg HPV DNA  ? ?Last mammogram: 04/30/21  Results were: normal--routine follow-up in 12 months ?There is no FH of breast cancer. There is no FH of ovarian cancer. The patient does do self-breast exams. ? ?Tobacco use: The patient denies current or previous tobacco use. ?Alcohol use: none ?No drug use.  ?Exercise: moderately active ? ?She does get adequate calcium and Vitamin D in her diet. ? ?Has had issues with hair loss/thinning on head and arms for 4-5 yrs. Pt had extensive normal labs, including hormone levels, with PCP. Was referred to derm who diagnosed with her female pattern baldness; pt then was referred to endocrine who declined the referral. Pt then send to [facility] [place] for hormone testing but hasn't seen them yet. Pt is doing hair/nail supplements. No female pattern baldness in fam. Washes hair once wkly to cut down on hair loss, does have new hair growth. Pt thinks she has been on emgality and OCPs for the same amt of time as hair loss.  ? ?Patient Active Problem List  ? Diagnosis Date Noted  ? Hair loss 03/24/2021  ? Birth control counseling 03/24/2021  ? Herpes 03/24/2021  ? Vitamin D insufficiency 06/06/2016  ? Recurrent occipital headache 04/22/2016  ? Bilateral occipital neuralgia 04/22/2016  ? Cervico-occipital neuralgia of right side 04/22/2016  ? Cervico-occipital neuralgia of left side 04/22/2016  ? Chronic pain syndrome 04/22/2016  ? Vestibular migraine 06/01/2013  ? ? ?Past  Surgical History:  ?Procedure Laterality Date  ? adnoids suture    ? DILATION AND CURETTAGE OF UTERUS    ? TONSILLECTOMY    ? WISDOM TOOTH EXTRACTION    ? ? ?Family History  ?Problem Relation Age of Onset  ? Arthritis Mother   ? Headache Mother   ? Fibroids Mother   ? Drug abuse Father   ? Diabetes Maternal Grandmother   ? Parkinson's disease Maternal Grandfather   ? ? ?Social History  ? ?Socioeconomic History  ? Marital status: Single  ?  Spouse name: Not on file  ? Number of children: Not on file  ? Years of education: Not on file  ? Highest education level: Not on file  ?Occupational History  ? Not on file  ?Tobacco Use  ? Smoking status: Former  ?  Types: Cigarettes  ?  Quit date: 06/29/2008  ?  Years since quitting: 12.8  ? Smokeless tobacco: Never  ?Vaping Use  ? Vaping Use: Never used  ?Substance and Sexual Activity  ? Alcohol use: No  ? Drug use: No  ? Sexual activity: Not Currently  ?  Birth control/protection: Pill  ?Other Topics Concern  ? Not on file  ?Social History Narrative  ? Not on file  ? ?Social Determinants of Health  ? ?Financial Resource Strain: Not on file  ?Food Insecurity: Not on file  ?Transportation Needs: Not on file  ?Physical Activity: Not on file  ?Stress: Not on file  ?  Social Connections: Not on file  ?Intimate Partner Violence: Not on file  ? ? ? ?Current Outpatient Medications:  ?  amitriptyline (ELAVIL) 25 MG tablet, Take by mouth., Disp: , Rfl:  ?  desogestrel-ethinyl estradiol (APRI) 0.15-30 MG-MCG tablet, Take 1 tablet by mouth daily. CONTINUOUS DOSING, Disp: 84 tablet, Rfl: 4 ?  EMGALITY 120 MG/ML SOAJ, SMARTSIG:120 Milligram(s) SUB-Q Every 4 Weeks, Disp: , Rfl:  ?  UBRELVY 100 MG TABS, Take by mouth., Disp: , Rfl:  ?  valACYclovir (VALTREX) 1000 MG tablet, Take 1 tablet (1,000 mg total) by mouth daily., Disp: 90 tablet, Rfl: 3 ?  Vitamin D, Ergocalciferol, (DRISDOL) 1.25 MG (50000 UNIT) CAPS capsule, Take 1 capsule (50,000 Units total) by mouth every 7 (seven) days. (taking one  tablet per week), Disp: 12 capsule, Rfl: 0 ? ? ? ? ?ROS: ? ?Review of Systems  ?Constitutional:  Negative for fatigue, fever and unexpected weight change.  ?Respiratory:  Negative for cough, shortness of breath and wheezing.   ?Cardiovascular:  Negative for chest pain, palpitations and leg swelling.  ?Gastrointestinal:  Negative for blood in stool, constipation, diarrhea, nausea and vomiting.  ?Endocrine: Negative for cold intolerance, heat intolerance and polyuria.  ?Genitourinary:  Negative for dyspareunia, dysuria, flank pain, frequency, genital sores, hematuria, menstrual problem, pelvic pain, urgency, vaginal bleeding, vaginal discharge and vaginal pain.  ?Musculoskeletal:  Negative for back pain, joint swelling and myalgias.  ?Skin:  Negative for rash.  ?Neurological:  Negative for dizziness, syncope, light-headedness, numbness and headaches.  ?Hematological:  Negative for adenopathy.  ?Psychiatric/Behavioral:  Negative for agitation, confusion, sleep disturbance and suicidal ideas. The patient is not nervous/anxious.   ?BREAST: No symptoms ? ? ?Objective: ?BP 110/70   Ht 5\' 2"  (1.575 m)   Wt 126 lb (57.2 kg)   BMI 23.05 kg/m?  ? ? ?Physical Exam ?Constitutional:   ?   Appearance: She is well-developed.  ?Genitourinary:  ?   Vulva normal.  ?   Right Labia: No rash, tenderness or lesions. ?   Left Labia: No tenderness, lesions or rash. ?   No vaginal discharge, erythema or tenderness.  ? ?   Right Adnexa: not tender and no mass present. ?   Left Adnexa: not tender and no mass present. ?   No cervical friability or polyp.  ?   Uterus is not enlarged or tender.  ?Breasts: ?   Right: No mass, nipple discharge, skin change or tenderness.  ?   Left: No mass, nipple discharge, skin change or tenderness.  ?Neck:  ?   Thyroid: No thyromegaly.  ?Cardiovascular:  ?   Rate and Rhythm: Normal rate and regular rhythm.  ?   Heart sounds: Normal heart sounds. No murmur heard. ?Pulmonary:  ?   Effort: Pulmonary effort is  normal.  ?   Breath sounds: Normal breath sounds.  ?Abdominal:  ?   Palpations: Abdomen is soft.  ?   Tenderness: There is no abdominal tenderness. There is no guarding or rebound.  ?Musculoskeletal:     ?   General: Normal range of motion.  ?   Cervical back: Normal range of motion.  ?Lymphadenopathy:  ?   Cervical: No cervical adenopathy.  ?Neurological:  ?   General: No focal deficit present.  ?   Mental Status: She is alert and oriented to person, place, and time.  ?   Cranial Nerves: No cranial nerve deficit.  ?Skin: ?   General: Skin is warm and dry.  ?Psychiatric:     ?  Mood and Affect: Mood normal.     ?   Behavior: Behavior normal.     ?   Thought Content: Thought content normal.     ?   Judgment: Judgment normal.  ?Vitals reviewed.  ? ? ?Assessment/Plan: ?Encounter for annual routine gynecological examination ? ?Cervical cancer screening - Plan: Cytology - PAP ? ?Screening for HPV (human papillomavirus) - Plan: Cytology - PAP ? ?Encounter for surveillance of contraceptive pills - Plan: desogestrel-ethinyl estradiol (APRI) 0.15-30 MG-MCG tablet; OCP change due to hair loss. Does cont dosing, f/u prn.  ? ?Encounter for screening mammogram for malignant neoplasm of breast; pt current on mammo ? ?Hair loss--discussed normal labs, and hormone labs while on OCPs are not accurate. Pt with low testosterone levels as expected on OCPs. Discussed med side effect, but not an issue with emgality. Will change OCPs to see if helps with hair loss. Cont hair supplements, change shampoo/conditioner in case due to chemicals in product.  ? ?Meds ordered this encounter  ?Medications  ? desogestrel-ethinyl estradiol (APRI) 0.15-30 MG-MCG tablet  ?  Sig: Take 1 tablet by mouth daily. CONTINUOUS DOSING  ?  Dispense:  84 tablet  ?  Refill:  4  ?  Order Specific Question:   Supervising Provider  ?  AnswerGae Dry [id number]  ? ?          ?GYN counsel adequate intake of calcium and vitamin D, diet and exercise ? ? ?   F/U ? Return in about 1 year (around 05/02/2022). ? ?China Deitrick B. Latrenda Irani, PA-C ?05/01/2021 ?4:50 PM ?

## 2021-05-01 NOTE — Telephone Encounter (Signed)
-----   Message from Berniece Pap, FNP sent at 04/30/2021  4:53 PM EDT ----- ?Negative mammogram repeat in one year unless clinically indicated sooner.  ?IMPRESSION: ?No mammographic evidence of malignancy. A result letter of this ?screening mammogram will be mailed directly to the patient. ?? ?RECOMMENDATION: ?Screening mammogram in one year. (Code:SM-B-01Y) ?? ?BI-RADS CATEGORY  1: Negative. ?? ?? ?Electronically Signed ?  By: Beckie Salts M.D. ?  On: 04/30/2021 16:40 ?

## 2021-05-03 LAB — CYTOLOGY - PAP
Comment: NEGATIVE
Diagnosis: NEGATIVE
High risk HPV: NEGATIVE

## 2021-05-21 ENCOUNTER — Ambulatory Visit (INDEPENDENT_AMBULATORY_CARE_PROVIDER_SITE_OTHER): Payer: Commercial Managed Care - PPO

## 2021-05-21 DIAGNOSIS — E538 Deficiency of other specified B group vitamins: Secondary | ICD-10-CM

## 2021-05-21 MED ORDER — CYANOCOBALAMIN 1000 MCG/ML IJ SOLN
1000.0000 ug | Freq: Once | INTRAMUSCULAR | Status: AC
  Administered 2021-05-21: 1000 ug via INTRAMUSCULAR

## 2021-05-21 NOTE — Progress Notes (Signed)
Pt arrived for B12 injection, given in L deltoid. Pt tolerated injection well, showed no signs of distress nor voiced any concerns.  ?

## 2021-06-18 ENCOUNTER — Encounter: Payer: Commercial Managed Care - PPO | Admitting: Adult Health

## 2021-06-21 ENCOUNTER — Encounter: Payer: Self-pay | Admitting: *Deleted

## 2021-06-21 ENCOUNTER — Ambulatory Visit (INDEPENDENT_AMBULATORY_CARE_PROVIDER_SITE_OTHER): Payer: Commercial Managed Care - PPO

## 2021-06-21 DIAGNOSIS — E538 Deficiency of other specified B group vitamins: Secondary | ICD-10-CM | POA: Diagnosis not present

## 2021-06-21 MED ORDER — CYANOCOBALAMIN 1000 MCG/ML IJ SOLN
1000.0000 ug | Freq: Once | INTRAMUSCULAR | Status: AC
  Administered 2021-06-21: 1000 ug via INTRAMUSCULAR

## 2021-06-25 ENCOUNTER — Other Ambulatory Visit: Payer: Commercial Managed Care - PPO

## 2021-06-25 NOTE — Progress Notes (Signed)
Patient presented for B 12 injection to right deltoid, patient voiced no concerns nor showed any signs of distress during injection. 

## 2021-06-26 ENCOUNTER — Encounter: Payer: Self-pay | Admitting: Internal Medicine

## 2021-06-26 ENCOUNTER — Ambulatory Visit (INDEPENDENT_AMBULATORY_CARE_PROVIDER_SITE_OTHER): Payer: Commercial Managed Care - PPO | Admitting: Internal Medicine

## 2021-06-26 VITALS — BP 114/70 | HR 81 | Temp 98.5°F | Resp 14 | Ht 62.0 in | Wt 131.4 lb

## 2021-06-26 DIAGNOSIS — L659 Nonscarring hair loss, unspecified: Secondary | ICD-10-CM | POA: Diagnosis not present

## 2021-06-26 DIAGNOSIS — R7989 Other specified abnormal findings of blood chemistry: Secondary | ICD-10-CM

## 2021-06-26 DIAGNOSIS — Z8261 Family history of arthritis: Secondary | ICD-10-CM

## 2021-06-26 DIAGNOSIS — M79641 Pain in right hand: Secondary | ICD-10-CM

## 2021-06-26 DIAGNOSIS — L603 Nail dystrophy: Secondary | ICD-10-CM | POA: Diagnosis not present

## 2021-06-26 DIAGNOSIS — R7982 Elevated C-reactive protein (CRP): Secondary | ICD-10-CM

## 2021-06-26 DIAGNOSIS — M2550 Pain in unspecified joint: Secondary | ICD-10-CM | POA: Diagnosis not present

## 2021-06-26 DIAGNOSIS — R700 Elevated erythrocyte sedimentation rate: Secondary | ICD-10-CM

## 2021-06-26 DIAGNOSIS — M79642 Pain in left hand: Secondary | ICD-10-CM

## 2021-06-26 NOTE — Patient Instructions (Addendum)
[employer] rosemary scalp oil [employer]  ? ?Dr. Isaac Laud  ? ?[facility] - [facility] ?[phone] [place], [place] [address] ?[phone]  ? ? ?Office in [place] ?[address] ? ?[place], [place] [address] ? ?Get Directions ?Hours of Operation ? ?Mon - Fri: 8 am - 5 pm ?Appointments ? ?[phone] (FAX) ?

## 2021-06-26 NOTE — Progress Notes (Signed)
Chief Complaint  ?Patient presents with  ? Transitions Of Care  ?  Former [facility] pt. Disc referral to endocrinology. Has been seeing [facility] is up to date on pap,mammogram. Will like Vit B12 & D checked.   ? ?TOC ?1. Brittle nails and hair loss significant x years saw [facility] and dermatologist stated female pattern hair loss her ob/gyn recently tried her on new ocp to see if this helps she takes skin, hair and nails supplements, collagen, biotin but no improvements has tried visvascalle, rogaine over the years w/o help ?No chemical processing of her hair and washes hair 2x per  ?Large quantities of her hair come out and her mom and brother have full heads of hair ?03/22/21 09:45 ?LH: 0.18 ?FSH: 0.1 ?Prolactin: 6.1 ?Glucose: 84 ?Estrogen: 166.1 ?Free Testosterone: 0.7 ?Sex Horm Binding Glob, Serum: 152 (H) ?Testosterone, Total, LC-MS-MS: 12 ?PTH, Intact: 46 ?TSH: 0.63 ?Triiodothyronine (T3): 176 ?Thyroxine (T4): 9.6 ? ?04/23/21 09:47 ?TSH: 0.45 ? ? ?(H): Data is abnormally high ? ?03/22/21 09:45 ?VITD: 39.23 ? ?04/23/21 09:47 ?VITD: 60.46 ?08/11/18 15:18 ?Iron: 79 ?UIBC: 246 ?TIBC: 325 ?Ferritin: 64 ?Iron Saturation: 24 ? ?03/22/21 09:45 ?Iron: 111 ?TIBC: 361 ?%SAT: 31 ?Ferritin: 79 ?Folate: >24.2 ? ?04/23/21 09:47 ?Folate: 21.4 ? ? ?[place] mom with RA since 55s and she herself has knuckle pain and ho elevated esr in 2018  ? ? ? ?Review of Systems  ?Constitutional:  Negative for weight loss.  ?HENT:  Negative for hearing loss.   ?Eyes:  Negative for blurred vision.  ?Respiratory:  Negative for shortness of breath.   ?Cardiovascular:  Negative for chest pain.  ?Gastrointestinal:  Negative for abdominal pain and blood in stool.  ?Genitourinary:  Negative for dysuria.  ?Musculoskeletal:  Negative for falls and joint pain.  ?Skin:  Negative for rash.  ?     +hair loss and brittle nails ?  ?Neurological:  Negative for headaches.  ?Psychiatric/Behavioral:  Negative for depression.   ?Past Medical History:   ?Diagnosis Date  ? Allergy   ? trees, dust mites  ? COVID-19   ? x2  ? Migraines   ? ?Past Surgical History:  ?Procedure Laterality Date  ? adnoids suture    ? DILATION AND CURETTAGE OF UTERUS    ? TONSILLECTOMY    ? WISDOM TOOTH EXTRACTION    ? ?Family History  ?Problem Relation Age of Onset  ? Arthritis Mother   ?     RA since 4s  ? Headache Mother   ? Fibroids Mother   ? Rheum arthritis Mother   ? Drug abuse Father   ? Diabetes Maternal Grandmother   ? Parkinson's disease Maternal Grandfather   ? ?Social History  ? ?Socioeconomic History  ? Marital status: Single  ?  Spouse name: Not on file  ? Number of children: Not on file  ? Years of education: Not on file  ? Highest education level: Not on file  ?Occupational History  ? Not on file  ?Tobacco Use  ? Smoking status: Former  ?  Types: Cigarettes  ?  Quit date: 06/29/2008  ?  Years since quitting: 13.0  ? Smokeless tobacco: Never  ?Vaping Use  ? Vaping Use: Never used  ?Substance and Sexual Activity  ? Alcohol use: No  ? Drug use: No  ? Sexual activity: Not Currently  ?  Birth control/protection: Pill  ?Other Topics Concern  ? Not on file  ?Social History Narrative  ? Not on file  ? ?  Social Determinants of Health  ? ?Financial Resource Strain: Not on file  ?Food Insecurity: Not on file  ?Transportation Needs: Not on file  ?Physical Activity: Not on file  ?Stress: Not on file  ?Social Connections: Not on file  ?Intimate Partner Violence: Not on file  ? ?Current Meds  ?Medication Sig  ? amitriptyline (ELAVIL) 25 MG tablet Take by mouth.  ? desogestrel-ethinyl estradiol (APRI) 0.15-30 MG-MCG tablet Take 1 tablet by mouth daily. CONTINUOUS DOSING  ? EMGALITY 120 MG/ML SOAJ SMARTSIG:120 Milligram(s) SUB-Q Every 4 Weeks  ? UBRELVY 100 MG TABS Take by mouth.  ? valACYclovir (VALTREX) 1000 MG tablet Take 1 tablet (1,000 mg total) by mouth daily.  ? ?No Known Allergies ?Recent Results (from the past 2160 hour(s))  ?POCT urinalysis dipstick     Status: Abnormal  ?  Collection Time: 03/29/21  4:14 PM  ?Result Value Ref Range  ? Color, UA yellow yellow  ? Clarity, UA cloudy (A) clear  ? Glucose, UA negative negative mg/dL  ? Bilirubin, UA negative negative  ? Ketones, POC UA negative negative mg/dL  ? Spec Grav, UA 1.015 1.010 - 1.025  ? Blood, UA negative negative  ? pH, UA 8.5 (A) 5.0 - 8.0  ? Protein Ur, POC negative negative mg/dL  ? Urobilinogen, UA 0.2 0.2 or 1.0 E.U./dL  ? Nitrite, UA Negative Negative  ? Leukocytes, UA Trace (A) Negative  ?Urine Culture     Status: Abnormal  ? Collection Time: 03/29/21  5:38 PM  ? Specimen: Urine, Clean Catch  ?Result Value Ref Range  ? Specimen Description URINE, CLEAN CATCH   ? Special Requests NONE   ? Culture (A)   ?  30,000 COLONIES/mL LACTOBACILLUS SPECIES ?Standardized susceptibility testing for this organism is not available. ?Performed at [facility], [place] [address] [place], [place] [address] ?  ? Report Status 03/31/2021 FINAL   ?B12 and Folate Panel     Status: None  ? Collection Time: 04/23/21  9:47 AM  ?Result Value Ref Range  ? Vitamin B-12 628 211 - 911 pg/mL  ? Folate 21.4 >5.9 ng/mL  ?VITAMIN D 25 Hydroxy (Vit-D Deficiency, Fractures)     Status: None  ? Collection Time: 04/23/21  9:47 AM  ?Result Value Ref Range  ? VITD 60.46 30.00 - 100.00 ng/mL  ?TSH     Status: None  ? Collection Time: 04/23/21  9:47 AM  ?Result Value Ref Range  ? TSH 0.45 0.35 - 5.50 uIU/mL  ?Cytology - PAP     Status: None  ? Collection Time: 05/01/21  3:55 PM  ?Result Value Ref Range  ? High risk HPV Negative   ? Adequacy    ?  Satisfactory for evaluation; transformation zone component PRESENT.  ? Diagnosis    ?  - Negative for intraepithelial lesion or malignancy (NILM)  ? Comment Normal Reference Range HPV - Negative   ? ?Objective  ?Body mass index is 24.03 kg/m?. ?Wt Readings from Last 3 Encounters:  ?06/26/21 131 lb 6.4 oz (59.6 kg)  ?05/01/21 126 lb (57.2 kg)  ?03/21/21 128 lb 3.2 oz (58.2 kg)  ? ?Temp Readings from Last 3  Encounters:  ?06/26/21 98.5 ?F (36.9 ?C) (Oral)  ?03/29/21 99.1 ?F (37.3 ?C) (Oral)  ?03/21/21 98.3 ?F (36.8 ?C) (Oral)  ? ?BP Readings from Last 3 Encounters:  ?06/26/21 114/70  ?05/01/21 110/70  ?03/29/21 107/77  ? ?Pulse Readings from Last 3 Encounters:  ?06/26/21 81  ?03/29/21 81  ?  03/21/21 89  ? ? ?Physical Exam ?Vitals and nursing note reviewed.  ?Constitutional:   ?   Appearance: Normal appearance. She is well-developed and well-groomed.  ?HENT:  ?   Head: Normocephalic and atraumatic.  ?Eyes:  ?   Conjunctiva/sclera: Conjunctivae normal.  ?   Pupils: Pupils are equal, round, and reactive to light.  ?Cardiovascular:  ?   Rate and Rhythm: Normal rate and regular rhythm.  ?   Heart sounds: Normal heart sounds. No murmur heard. ?Pulmonary:  ?   Effort: Pulmonary effort is normal.  ?   Breath sounds: Normal breath sounds.  ?Abdominal:  ?   General: Abdomen is flat. Bowel sounds are normal.  ?   Tenderness: There is no abdominal tenderness.  ?Musculoskeletal:     ?   General: No tenderness.  ?Skin: ?   General: Skin is warm and dry.  ?   Comments: Pt pulled at hair with multiple strands of hair she pulled out   ?Neurological:  ?   General: No focal deficit present.  ?   Mental Status: She is alert and oriented to person, place, and time. Mental status is at baseline.  ?   Cranial Nerves: Cranial nerves 2-12 are intact.  ?   Motor: Motor function is intact.  ?   Coordination: Coordination is intact.  ?   Gait: Gait is intact.  ?Psychiatric:     ?   Attention and Perception: Attention and perception normal.     ?   Mood and Affect: Mood and affect normal.     ?   Speech: Speech normal.     ?   Behavior: Behavior normal. Behavior is cooperative.     ?   Thought Content: Thought content normal.     ?   Cognition and Memory: Cognition and memory normal.     ?   Judgment: Judgment normal.  ? ? ?Assessment  ?Plan  ?Hair loss/brittle nails- Plan: Ambulatory referral to Dermatology, Cortisol-am, blood, Antinuclear Antib  (ANA), Sedimentation rate, C-reactive protein, Rheumatoid Factor, Ambulatory referral to Dermatology Dr. Lois Huxley  ? ?Polyarthralgia FH RA in mom with pain in b/l hands- Plan: Antinuclear Antib (ANA), Sedimentatio

## 2021-06-28 DIAGNOSIS — R7989 Other specified abnormal findings of blood chemistry: Secondary | ICD-10-CM | POA: Insufficient documentation

## 2021-06-28 LAB — RHEUMATOID FACTOR: Rheumatoid fact SerPl-aCnc: 11 IU/mL (ref <14.0)

## 2021-06-28 LAB — SEDIMENTATION RATE: Sed Rate: 16 mm/hr (ref 0–32)

## 2021-06-28 LAB — CORTISOL-AM, BLOOD: Cortisol - AM: 27.9 ug/dL — ABNORMAL HIGH (ref 6.2–19.4)

## 2021-06-28 LAB — ANA: Anti Nuclear Antibody (ANA): NEGATIVE

## 2021-06-28 LAB — C-REACTIVE PROTEIN: CRP: 22 mg/L — ABNORMAL HIGH (ref 0–10)

## 2021-06-28 NOTE — Addendum Note (Signed)
Addended by: Orland Mustard on: 06/28/2021 04:38 PM ? ? Modules accepted: Orders ? ?

## 2021-06-29 ENCOUNTER — Telehealth: Payer: Self-pay

## 2021-06-29 LAB — CYCLIC CITRUL PEPTIDE ANTIBODY, IGG/IGA: Cyclic Citrullin Peptide Ab: 4 units (ref 0–19)

## 2021-06-29 NOTE — Telephone Encounter (Signed)
Lvm for pt to return call in regards to labs.  ? ?Per Dr.Tracy: ?Cortisol is elevated  ?Will refer to endocrine [place] but 1st available likely in 08/2021  ?C reactive protein elevated nonspecific inflammatory marker  ?ANA for lupus negative  ?Sed rate normal inflammatory marker  ?Rheumatoid factor normal  ?Ccp lab for rheumatoid arthritis negative  ?

## 2021-08-10 ENCOUNTER — Ambulatory Visit
Admission: RE | Admit: 2021-08-10 | Discharge: 2021-08-10 | Disposition: A | Discharge status: Home / Self Care | Payer: Commercial Managed Care - PPO | Source: Ambulatory Visit | Attending: Emergency Medicine | Admitting: Emergency Medicine

## 2021-08-10 VITALS — BP 123/74 | HR 83 | Temp 98.2°F | Resp 16

## 2021-08-10 DIAGNOSIS — N898 Other specified noninflammatory disorders of vagina: Secondary | ICD-10-CM | POA: Diagnosis not present

## 2021-08-10 DIAGNOSIS — R109 Unspecified abdominal pain: Secondary | ICD-10-CM | POA: Insufficient documentation

## 2021-08-10 DIAGNOSIS — Z202 Contact with and (suspected) exposure to infections with a predominantly sexual mode of transmission: Secondary | ICD-10-CM

## 2021-08-10 LAB — POCT URINALYSIS DIP (MANUAL ENTRY)
Bilirubin, UA: NEGATIVE
Glucose, UA: NEGATIVE mg/dL
Ketones, POC UA: NEGATIVE mg/dL
Leukocytes, UA: NEGATIVE
Nitrite, UA: NEGATIVE
Protein Ur, POC: NEGATIVE mg/dL
Spec Grav, UA: 1.025 (ref 1.010–1.025)
Urobilinogen, UA: 0.2 E.U./dL
pH, UA: 5.5 (ref 5.0–8.0)

## 2021-08-10 LAB — POCT URINE PREGNANCY: Preg Test, Ur: NEGATIVE

## 2021-08-10 MED ORDER — CEFTRIAXONE SODIUM 500 MG IJ SOLR
500.0000 mg | Freq: Once | INTRAMUSCULAR | Status: AC
  Administered 2021-08-10: 500 mg via INTRAMUSCULAR

## 2021-08-10 MED ORDER — METRONIDAZOLE 500 MG PO TABS: 500.0000 mg | ORAL_TABLET | Freq: Two times a day (BID) | ORAL | 0 refills | Status: DC

## 2021-08-10 MED ORDER — CYCLOBENZAPRINE HCL 5 MG PO TABS: 5.0000 mg | ORAL_TABLET | Freq: Three times a day (TID) | ORAL | 0 refills | Status: DC | PRN

## 2021-08-10 NOTE — ED Provider Notes (Addendum)
[facility]    CSN: [id number] Arrival date & time: 08/10/21  1351      History   Chief Complaint Chief Complaint  Patient presents with   SEXUALLY TRANSMITTED DISEASE    Possibly trichomoniasis, severe stomach cramping - Entered by patient   Abdominal Cramping    HPI Carolyn Johnston is a 41 y.o. female.   Patient presents with severe lower abdominal cramping for 3 days.  Pain has been constant, rated a 6 out of 10.  Had 1 occurrence of vaginal bleeding with intercourse prior to symptoms beginning and then it cramping began after a another sexual encounter.  Sexually active, 1 partner, no condom use, no known exposure.  Denies vaginal discharge, itching or odor, urinary frequency, urgency, hematuria, new rash or lesions, flank pain, fever or chills.  Last Pap smear negative, was recently evaluated by her OB/GYN earlier this year.  Denies history of fibroids or uterine cyst.  Taking OCP daily.  Past Medical History:  Diagnosis Date   Allergy    trees, dust mites   COVID-19    x2   Migraines     Patient Active Problem List   Diagnosis Date Noted   High serum cortisol 06/28/2021   Brittle nails 06/26/2021   Hair loss 03/24/2021   Birth control counseling 03/24/2021   Herpes 03/24/2021   Vitamin D insufficiency 06/06/2016   Recurrent occipital headache 04/22/2016   Bilateral occipital neuralgia 04/22/2016   Cervico-occipital neuralgia of right side 04/22/2016   Cervico-occipital neuralgia of left side 04/22/2016   Chronic pain syndrome 04/22/2016   Vestibular migraine 06/01/2013    Past Surgical History:  Procedure Laterality Date   adnoids suture     DILATION AND CURETTAGE OF UTERUS     TONSILLECTOMY     WISDOM TOOTH EXTRACTION      OB History     Gravida  3   Para  2   Term      Preterm      AB  1   Living         SAB  1   IAB      Ectopic      Multiple      Live Births               Home Medications    Prior to  Admission medications   Medication Sig Start Date End Date Taking? Authorizing Provider  amitriptyline (ELAVIL) 25 MG tablet Take by mouth. 01/18/21 01/18/22 Yes [provider]  cyclobenzaprine (FLEXERIL) 5 MG tablet Take 1 tablet (5 mg total) by mouth 3 (three) times daily as needed for muscle spasms. 08/10/21  Yes Nello Corro, Elita Boone, NP  desogestrel-ethinyl estradiol (APRI) 0.15-30 MG-MCG tablet Take 1 tablet by mouth daily. CONTINUOUS DOSING 05/01/21  Yes Copland, Ilona Sorrel, PA-C  EMGALITY 120 MG/ML SOAJ SMARTSIG:120 Milligram(s) SUB-Q Every 4 Weeks 03/03/21  Yes [provider]  metroNIDAZOLE (FLAGYL) 500 MG tablet Take 1 tablet (500 mg total) by mouth 2 (two) times daily. 08/10/21  Yes Amna Welker, Elita Boone, NP  UBRELVY 100 MG TABS Take by mouth. 02/04/20  Yes [provider]  valACYclovir (VALTREX) 1000 MG tablet Take 1 tablet (1,000 mg total) by mouth daily. 03/21/21  Yes Flinchum, Eula Fried, FNP  Vitamin D, Ergocalciferol, (DRISDOL) 1.25 MG (50000 UNIT) CAPS capsule Take 1 capsule (50,000 Units total) by mouth every 7 (seven) days. (taking one tablet per week) 03/25/21   Flinchum, Eula Fried, FNP  Family History Family History  Problem Relation Age of Onset   Arthritis Mother        RA since 45s   Headache Mother    Fibroids Mother    Rheum arthritis Mother    Drug abuse Father    Diabetes Maternal Grandmother    Parkinson's disease Maternal Grandfather     Social History Social History   Tobacco Use   Smoking status: Former    Types: Cigarettes    Quit date: 06/29/2008    Years since quitting: 13.1   Smokeless tobacco: Never  Vaping Use   Vaping Use: Never used  Substance Use Topics   Alcohol use: No   Drug use: No     Allergies   Patient has no known allergies.   Review of Systems Review of Systems Defer to HPI    Physical Exam Triage Vital Signs ED Triage Vitals  Enc Vitals Group     BP 08/10/21 1415 123/74     Pulse Rate 08/10/21 1415  83     Resp 08/10/21 1415 16     Temp 08/10/21 1415 98.2 F (36.8 C)     Temp Source 08/10/21 1415 Oral     SpO2 08/10/21 1415 98 %     Weight --      Height --      Head Circumference --      Peak Flow --      Pain Score 08/10/21 1413 6     Pain Loc --      Pain Edu? --      Excl. in GC? --    No data found.  Updated Vital Signs BP 123/74 (BP Location: Left Arm)   Pulse 83   Temp 98.2 F (36.8 C) (Oral)   Resp 16   SpO2 98%   Visual Acuity Right Eye Distance:   Left Eye Distance:   Bilateral Distance:    Right Eye Near:   Left Eye Near:    Bilateral Near:     Physical Exam Constitutional:      Appearance: Normal appearance.  HENT:     Head: Normocephalic.  Eyes:     Extraocular Movements: Extraocular movements intact.  Pulmonary:     Effort: Pulmonary effort is normal.  Abdominal:     General: Abdomen is flat. Bowel sounds are normal.     Palpations: Abdomen is soft.     Tenderness: There is abdominal tenderness in the suprapubic area. There is no right CVA tenderness or left CVA tenderness.  Genitourinary:    Comments: No abnormalities noted to the external genitalia, no abnormalities noted to the cervix, blood and thin Sigismund Cross discharge noted within the vaginal canal without odor  Neurological:     Mental Status: She is alert and oriented to person, place, and time. Mental status is at baseline.  Psychiatric:        Mood and Affect: Mood normal.        Behavior: Behavior normal.      UC Treatments / Results  Labs (all labs ordered are listed, but only abnormal results are displayed) Labs Reviewed  POCT URINALYSIS DIP (MANUAL ENTRY) - Abnormal; Notable for the following components:      Result Value   Blood, UA trace-lysed (*)    All other components within normal limits  POCT URINE PREGNANCY  CERVICOVAGINAL ANCILLARY ONLY    EKG   Radiology No results found.  Procedures Procedures (including critical care time)  Medications Ordered  in  UC Medications  cefTRIAXone (ROCEPHIN) injection 500 mg (has no administration in time range)    Initial Impression / Assessment and Plan / UC Course  I have reviewed the triage vital signs and the nursing notes.  Pertinent labs & imaging results that were available during my care of the patient were reviewed by me and considered in my medical decision making (see chart for details).  Vaginal discharge Abdominal cramping  Tenderness is noted over the suprapubic region and discharge is noted within the vaginal call out along with blood, no abnormalities are noted structurally, discussed with patient, urinalysis and urine pregnancy negative ,will prophylactically provide coverage for gonorrhea and begin treatment for trichomoniasis and BV as patient endorses recurrent BV infections in the past, STI labs pending, metronidazole 7-day course prescribed, advised abstaining from alcohol while using medication, advised abstinence until labs results, treatment is complete and symptoms have resolved, may follow-up with urgent care as needed Final Clinical Impressions(s) / UC Diagnoses   Final diagnoses:  Vaginal discharge  Abdominal cramping     Discharge Instructions      On exam there are no abnormalities structurally however there was some blood and Jaymari Cromie discharge noted within the vaginal canal  Today you are being treated prophylactically for gonorrhea, metronidazole and trichomoniasis  Take metronidazole twice daily for the next 7 days, please refrain  from drinking any alcohol while using this medication as it will make your stomach hurt very badly  Urinalysis is negative for bladder infection  Urine pregnancy is negative  Your STI labs are pending, you will be notified of any positive results and and treatment will be sent in for remaining infections at that time  You may use muscle relaxer every 8 hours as needed for comfort, be mindful this medication may make you drowsy, you may  use Tylenol 500 to 1000 mg every 6 hours and/or ibuprofen 800 mg every 8 hours in addition  May hold Warm compresses over your pelvic region for comfort  Lease refrain from any sexual activity until all labs have resulted and all treatment is complete   ED Prescriptions     Medication Sig Dispense Auth. Provider   metroNIDAZOLE (FLAGYL) 500 MG tablet Take 1 tablet (500 mg total) by mouth 2 (two) times daily. 14 tablet Zaniel Marineau R, NP   cyclobenzaprine (FLEXERIL) 5 MG tablet Take 1 tablet (5 mg total) by mouth 3 (three) times daily as needed for muscle spasms. 30 tablet Valinda Hoar, NP      PDMP not reviewed this encounter.   Valinda Hoar, NP 08/10/21 1508    Valinda Hoar, NP 08/10/21 [phone]

## 2021-08-13 LAB — CERVICOVAGINAL ANCILLARY ONLY
Bacterial Vaginitis (gardnerella): NEGATIVE
Candida Glabrata: NEGATIVE
Candida Vaginitis: NEGATIVE
Chlamydia: NEGATIVE
Comment: NEGATIVE
Comment: NEGATIVE
Comment: NEGATIVE
Comment: NEGATIVE
Comment: NEGATIVE
Comment: NORMAL
Neisseria Gonorrhea: NEGATIVE
Trichomonas: NEGATIVE

## 2021-09-07 ENCOUNTER — Encounter: Payer: Self-pay | Admitting: Obstetrics and Gynecology

## 2021-10-11 NOTE — Addendum Note (Signed)
Addended by: Quentin Ore on: 10/11/2021 05:30 PM   Modules accepted: Orders

## 2021-11-21 ENCOUNTER — Encounter: Payer: Self-pay | Admitting: Internal Medicine

## 2021-11-21 ENCOUNTER — Ambulatory Visit (INDEPENDENT_AMBULATORY_CARE_PROVIDER_SITE_OTHER): Payer: Commercial Managed Care - PPO | Admitting: Internal Medicine

## 2021-11-21 VITALS — BP 118/80 | HR 79 | Temp 98.4°F | Ht 62.0 in | Wt 136.0 lb

## 2021-11-21 DIAGNOSIS — L650 Telogen effluvium: Secondary | ICD-10-CM | POA: Diagnosis not present

## 2021-11-21 DIAGNOSIS — E538 Deficiency of other specified B group vitamins: Secondary | ICD-10-CM

## 2021-11-21 DIAGNOSIS — Z1231 Encounter for screening mammogram for malignant neoplasm of breast: Secondary | ICD-10-CM

## 2021-11-21 DIAGNOSIS — L709 Acne, unspecified: Secondary | ICD-10-CM | POA: Diagnosis not present

## 2021-11-21 DIAGNOSIS — L659 Nonscarring hair loss, unspecified: Secondary | ICD-10-CM

## 2021-11-21 DIAGNOSIS — N946 Dysmenorrhea, unspecified: Secondary | ICD-10-CM

## 2021-11-21 DIAGNOSIS — E559 Vitamin D deficiency, unspecified: Secondary | ICD-10-CM | POA: Diagnosis not present

## 2021-11-21 DIAGNOSIS — R5383 Other fatigue: Secondary | ICD-10-CM

## 2021-11-21 MED ORDER — NORGESTIMATE-ETH ESTRADIOL 0.25-35 MG-MCG PO TABS: 1.0000 | ORAL_TABLET | Freq: Every day | ORAL | 3 refills | Status: DC

## 2021-11-21 MED ORDER — MINOXIDIL 2.5 MG PO TABS: 2.5000 mg | ORAL_TABLET | Freq: Every day | ORAL | 3 refills | Status: AC

## 2021-11-21 MED ORDER — SPIRONOLACTONE 50 MG PO TABS: 50.0000 mg | ORAL_TABLET | Freq: Every day | ORAL | Status: DC

## 2021-11-21 NOTE — Progress Notes (Signed)
Chief Complaint  Patient presents with   Follow-up    F/u and pt would like to have her vitamin b12 and D levels checked along with discussing her birth control and the severe cramps she has    F/u  1. Hair loss not seen [facility] dermatology yet she takes otc biotin and skin hair nail supplements and mag/vitamin D and zinc 2. Elevated cortisol pending appt endocrine  3.. wants to change ocp from apri to sprintec 28 having painful cycles but never had this on sprintec also rec she f/u [facility] in the future  4. B12 and vitamin d checked   Review of Systems  Constitutional:  Negative for weight loss.  HENT:  Negative for hearing loss.   Eyes:  Negative for blurred vision.  Respiratory:  Negative for shortness of breath.   Cardiovascular:  Negative for chest pain.  Gastrointestinal:  Negative for abdominal pain and blood in stool.  Genitourinary:  Negative for dysuria.  Musculoskeletal:  Negative for falls and joint pain.  Skin:  Negative for rash.  Neurological:  Negative for headaches.  Psychiatric/Behavioral:  Negative for depression.    Past Medical History:  Diagnosis Date   Allergy    trees, dust mites   COVID-19    x2   Migraines    Past Surgical History:  Procedure Laterality Date   adnoids suture     DILATION AND CURETTAGE OF UTERUS     TONSILLECTOMY     WISDOM TOOTH EXTRACTION     Family History  Problem Relation Age of Onset   Arthritis Mother        RA since 71s   Headache Mother    Fibroids Mother    Rheum arthritis Mother    Drug abuse Father    Diabetes Maternal Grandmother    Parkinson's disease Maternal Grandfather    Social History   Socioeconomic History   Marital status: Single    Spouse name: Not on file   Number of children: Not on file   Years of education: Not on file   Highest education level: Not on file  Occupational History   Not on file  Tobacco Use   Smoking status: Former    Types: Cigarettes    Quit date: 06/29/2008     Years since quitting: 13.4   Smokeless tobacco: Never  Vaping Use   Vaping Use: Never used  Substance and Sexual Activity   Alcohol use: No   Drug use: No   Sexual activity: Not Currently    Birth control/protection: Pill  Other Topics Concern   Not on file  Social History Narrative   Works  [employer]   2 daughter (32 and 42 as of 11/21/21)   Social Determinants of Health   Financial Resource Strain: Not on file  Food Insecurity: Not on file  Transportation Needs: Not on file  Physical Activity: Not on file  Stress: Not on file  Social Connections: Not on file  Intimate Partner Violence: Not on file   Current Meds  Medication Sig   amitriptyline (ELAVIL) 25 MG tablet Take 25 mg by mouth in the morning and at bedtime. Taking 50 mg in total a day   cyclobenzaprine (FLEXERIL) 5 MG tablet Take 1 tablet (5 mg total) by mouth 3 (three) times daily as needed for muscle spasms.   EMGALITY 120 MG/ML SOAJ SMARTSIG:120 Milligram(s) SUB-Q Every 4 Weeks   minoxidil (LONITEN) 2.5 MG tablet Take 1 tablet (2.5 mg total) by mouth  daily.   norgestimate-ethinyl estradiol (SPRINTEC 28) 0.25-35 MG-MCG tablet Take 1 tablet by mouth daily. Use as directed stop Apri   UBRELVY 100 MG TABS Take by mouth.   valACYclovir (VALTREX) 1000 MG tablet Take 1 tablet (1,000 mg total) by mouth daily.   [DISCONTINUED] desogestrel-ethinyl estradiol (APRI) 0.15-30 MG-MCG tablet Take 1 tablet by mouth daily. CONTINUOUS DOSING   [DISCONTINUED] spironolactone (ALDACTONE) 25 MG tablet Take 25 mg by mouth daily.   No Known Allergies No results found for this or any previous visit (from the past 2160 hour(s)). Objective  Body mass index is 24.87 kg/m. Wt Readings from Last 3 Encounters:  11/21/21 136 lb (61.7 kg)  06/26/21 131 lb 6.4 oz (59.6 kg)  05/01/21 126 lb (57.2 kg)   Temp Readings from Last 3 Encounters:  11/21/21 98.4 F (36.9 C) (Oral)  08/10/21 98.2 F (36.8 C) (Oral)  06/26/21 98.5 F (36.9 C) (Oral)    BP Readings from Last 3 Encounters:  11/21/21 118/80  08/10/21 123/74  06/26/21 114/70   Pulse Readings from Last 3 Encounters:  11/21/21 79  08/10/21 83  06/26/21 81    Physical Exam Vitals and nursing note reviewed.  Constitutional:      Appearance: Normal appearance. She is well-developed and well-groomed.  HENT:     Head: Normocephalic and atraumatic.  Eyes:     Conjunctiva/sclera: Conjunctivae normal.     Pupils: Pupils are equal, round, and reactive to light.  Cardiovascular:     Rate and Rhythm: Normal rate and regular rhythm.     Heart sounds: Normal heart sounds. No murmur heard. Pulmonary:     Effort: Pulmonary effort is normal.     Breath sounds: Normal breath sounds.  Abdominal:     General: Abdomen is flat. Bowel sounds are normal.     Tenderness: There is no abdominal tenderness.  Musculoskeletal:        General: No tenderness.  Skin:    General: Skin is warm and dry.  Neurological:     General: No focal deficit present.     Mental Status: She is alert and oriented to person, place, and time. Mental status is at baseline.     Cranial Nerves: Cranial nerves 2-12 are intact.     Motor: Motor function is intact.     Coordination: Coordination is intact.     Gait: Gait is intact.  Psychiatric:        Attention and Perception: Attention and perception normal.        Mood and Affect: Mood and affect normal.        Speech: Speech normal.        Behavior: Behavior normal. Behavior is cooperative.        Thought Content: Thought content normal.        Cognition and Memory: Cognition and memory normal.        Judgment: Judgment normal.     Assessment  Plan  Hair loss ? Telogen effluvium - Plan: Ambulatory referral to Dermatology, minoxidil (LONITEN) 2.5 MG tablet qd  F/u [facility]  pt is scheduled on 05/24/2022 at 1:15 pm at [address] [place]  Dr is Denese Killings change to Dr. Mariel Kansky   Vitamin D deficiency - Plan: Vitamin D (25  hydroxy)  B12 deficiency - Plan: Vitamin B12  Acne, on spironlactone 50 mg qd  - Plan:   Dysmenorrhea - Plan: norgestimate-ethinyl estradiol ([place] 28) 0.25-35 MG-MCG tablet F/u ob/gyn [facility]   HM No  flu  Tdap 03/10/19  Covid 19 not had   04/30/21 mammogram neg ordered 04/2022  Pap 05/01/21 neg no hpv every 3 years  Colonoscopy age 59  Rec healthy diet and exercise    Provider: Dr. Olivia Mackie McLean-Scocuzza-Internal Medicine

## 2021-11-21 NOTE — Patient Instructions (Addendum)
Call ob/gyn if painful cycles continue  Phone Fax E-mail Address  [phone] Not available [address]   [place] [address]     Specialties     Obstetrics and Gynecology              Mammogram due due 05/01/22  Colonoscopy due age 41    pt is scheduled on 05/24/2022 at 1:15 pm at [address] is Nolon Bussing (Dr. Patrick North)   [facility] ; Dermatology - [employer]  [phone] [employer] [place], [place] [address]  [phone] ; General Dermatology - [facility]  [address]  pt can call to change to Dr Darreld Mclean once she decides at [phone].   Dysmenorrhea Dysmenorrhea refers to cramps caused by the muscles of the uterus tightening (contracting) during a menstrual period. Dysmenorrhea may be mild, or it may be severe enough to interfere with everyday activities for a few days each month. Primary dysmenorrhea is menstrual cramps that last a couple of days when a female starts having menstrual periods or soon after. As a female gets older or has a baby, the cramps will usually lessen or disappear. Secondary dysmenorrhea begins later in life and is caused by a disorder in the reproductive system. It lasts longer, and it may cause more pain than primary dysmenorrhea. The pain may start before the period and last a few days after the period. What are the causes? Dysmenorrhea is usually caused by an underlying problem, such as: Endometriosis. The tissue that lines the uterus (endometrium) growing outside of the uterus in other areas of the body. Adenomyosis. Endometrial tissue growing into the muscular walls of the uterus. Pelvic congestive syndrome. Blood vessels in the pelvis that fill with blood just before the menstrual period. Overgrowth of cells (polyps) in the endometrium or the lower part of the uterus (cervix). Uterine prolapse. The uterus dropping down into the vagina due to stretched or weak muscles. Bladder  problems, such as infection or inflammation. Intestinal problems, such as a tumor or irritable bowel syndrome. Cancer of the reproductive organs or bladder. Other causes of this condition may result from: A severely tipped uterus. A cervix that is closed or has a small opening. Noncancerous (benign) tumors in the uterus (fibroids). Pelvic inflammatory disease (PID). Pelvic scarring (adhesions) from a previous surgery. An ovarian cyst. An IUD (intrauterine device). What increases the risk? You are more likely to develop this condition if: You are younger than 42 years old. You started puberty early. You have irregular or heavy bleeding. You have never given birth. You have a family history of dysmenorrhea. You smoke or use nicotine products. You have high body weight or a low body weight. What are the signs or symptoms? Symptoms of this condition include: Cramping, throbbing pain in lower abdomen or lower back, or a feeling of fullness in the lower abdomen. Periods lasting for longer than 7 days. Headaches. Bloating. Fatigue. Nausea or vomiting. Diarrhea or loose stools. Sweating or dizziness. How is this diagnosed? This condition may be diagnosed based on: Your symptoms. Your medical history. A physical exam. Blood tests. A Pap test. This is a test in which cells from the cervix are tested for signs of cancer or infection. A pregnancy test. You may also have other tests, including: Imaging tests, such as: Ultrasound. A procedure to remove and examine a sample of endometrial tissue (dilation and curettage, D&C). A procedure to visually examine the inside of: The uterus (  hysteroscopy). The abdomen or pelvis (laparoscopy). The bladder (cystoscopy). X-rays. CT scan. MRI. How is this treated? Treatment depends on the cause of the dysmenorrhea. Treatment may include medicines, such as: Pain medicines. Hormone replacement therapy. Injections of progesterone to stop the  menstrual period. Birth control pills that contain the hormone progesterone. An IUD that contains the hormone progesterone. NSAIDs, such as ibuprofen. These may help to stop the production of hormones that cause cramps. Antidepressant medicines. Other treatment may include: Surgery to remove adhesions, endometriosis, ovarian cysts, fibroids, or the entire uterus (hysterectomy). Endometrial ablation. This is a procedure to destroy the endometrium. Presacral neurectomy. This is a procedure to cut the nerves in the bottom of the spine (sacrum) that go to the reproductive organs. Sacral nerve stimulation. This is a procedure to apply an electric current to nerves in the sacrum. Exercise and physical therapy. Meditation, yoga, and acupuncture. Work with your health care provider to determine what treatment or combination of treatments is best for you. Follow these instructions at home: Relieving pain and cramping  If directed, apply heat to your lower back or abdomen when you experience pain or cramps. Use the heat source that your health care provider recommends, such as a moist heat pack or a heating pad. Place a towel between your skin and the heat source. Leave the heat on for 20-30 minutes. Remove the heat if your skin turns bright red. This is especially important if you are unable to feel pain, heat, or cold. You may have a greater risk of getting burned. Do not sleep with a heating pad on. Exercise. Activities such as walking, swimming, or biking can help to relieve cramps. Massage your lower back or abdomen to help relieve pain. General instructions Take over-the-counter and prescription medicines only as told by your health care provider. Ask your health care provider if the medicine prescribed to you requires you to avoid driving or using machinery. Avoid alcohol and caffeine during and right before your period. These can make cramps worse. Do not use any products that contain  nicotine or tobacco. These products include cigarettes, chewing tobacco, and vaping devices, such as e-cigarettes. If you need help quitting, ask your health care provider. Keep all follow-up visits. This is important. Contact a health care provider if: You have pain that gets worse or does not get better with medicine. You have pain with sex. You develop nausea or vomiting with your period that is not controlled with medicine. Get help right away if: You faint. Summary Dysmenorrhea refers to cramps caused by the muscles of the uterus tightening (contracting) during a menstrual period. Dysmenorrhea may be mild, or it may be severe enough to interfere with everyday activities for a few days each month. Treatment depends on the cause of the dysmenorrhea. Work with your health care provider to determine what treatment or combination of treatments is best for you. This information is not intended to replace advice given to you by your health care provider. Make sure you discuss any questions you have with your health care provider. Document Revised: 09/22/2019 Document Reviewed: 09/22/2019 Elsevier Patient Education  [employer].   Nutrafol  Scalp stimulator  Collagen Supplements 40+   Minoxidil Tablets What is this medication? MINOXIDIL (mi NOX i dill) treats high blood pressure. It works by relaxing blood vessels, which decreases the amount of work the heart has to do. This medicine may be used for other purposes; ask your health care provider or pharmacist if you have  questions. COMMON BRAND NAME(S): Loniten What should I tell my care team before I take this medication? They need to know if you have any of these conditions: Angina Heart or blood vessel disease Kidney disease Lung disease Pheochromocytoma Previous heart attack An unusual or allergic reaction to minoxidil, other medications, foods, dyes, or preservatives Pregnant or trying to get pregnant Breast-feeding How  should I use this medication? Take this medication by mouth. Take it as directed on the prescription label at the same time every day. You can take it with or without food. If it upsets your stomach, take it with food. Keep taking it unless your care team tells you to stop. Talk to your care team about the use of this medication in children. While it may be prescribed for selected conditions, precautions do apply. Overdosage: If you think you have taken too much of this medicine contact a poison control center or emergency room at once. NOTE: This medicine is only for you. Do not share this medicine with others. What if I miss a dose? If you miss a dose, take it as soon as you can. If it is almost time for your next dose, take only that dose. Do not take double or extra doses. What may interact with this medication? This medication may interact with the following: Medications for chest pain Medications for high blood pressure This list may not describe all possible interactions. Give your health care provider a list of all the medicines, herbs, non-prescription drugs, or dietary supplements you use. Also tell them if you smoke, drink alcohol, or use illegal drugs. Some items may interact with your medicine. What should I watch for while using this medication? Check your heart rate and blood pressure regularly while you are taking this medication. Ask your care team what your heart rate and blood pressure should be and when you should contact them. While you are taking this medication, keep a check on your weight. Tell your care team if you rapidly gain more than 5 pounds. You may get dizzy. Do not drive, use machinery, or do anything that needs mental alertness until you know how this medication affects you. To reduce the risk of dizzy or fainting spells, do not sit or stand up quickly, especially if you are an older patient. Alcohol can make you more dizzy, and increase flushing and rapid heartbeats.  Avoid alcoholic drinks. Your mouth may get dry. Chewing sugarless gum or sucking hard candy, and drinking plenty of water may help. Contact your care team if the problem does not go away or is severe. Do not treat yourself for coughs, colds, or pain while you are taking this medication without asking your care team for advice. Some ingredients may increase your blood pressure. You may need to follow a special low sodium diet while taking this medication. Check with your care team. What side effects may I notice from receiving this medication? Side effects that you should report to your care team as soon as possible: Allergic reactions--skin rash, itching, hives, swelling of the face, lips, tongue, or throat Fast or irregular heartbeat Heart attack--pain or tightness in the chest, shoulders, arms, or jaw, nausea, shortness of breath, cold or clammy skin, feeling faint or lightheaded Heart failure--shortness of breath, swelling of the ankles, feet, or hands, sudden weight gain, unusual weakness or fatigue Low blood pressure--dizziness, feeling faint or lightheaded, blurry vision Stroke--sudden numbness or weakness of the face, arm, or leg, trouble speaking, confusion, trouble walking, loss of  balance or coordination, dizziness, severe headache, change in vision Worsening chest pain (angina)--pain, pressure, or tightness in the chest, neck, back, or arms Side effects that usually do not require medical attention (report to your care team if they continue or are bothersome): Change in hair color or texture Unexpected hair growth This list may not describe all possible side effects. Call your doctor for medical advice about side effects. You may report side effects to FDA at 1-800-FDA-1088. Where should I keep my medication? Keep out of the reach of children and pets. Store at room temperature between 20 and 25 degrees C (68 and 77 degrees F). Throw away any unused medication after the expiration  date. NOTE: This sheet is a summary. It may not cover all possible information. If you have questions about this medicine, talk to your doctor, pharmacist, or health care provider.  2023 Elsevier/Gold Standard (2020-10-24 00:00:00)

## 2021-11-22 LAB — CBC WITH DIFFERENTIAL/PLATELET
Basophils Absolute: 0 10*3/uL (ref 0.0–0.1)
Basophils Relative: 0.4 % (ref 0.0–3.0)
Eosinophils Absolute: 0 10*3/uL (ref 0.0–0.7)
Eosinophils Relative: 0.7 % (ref 0.0–5.0)
HCT: 38.5 % (ref 36.0–46.0)
Hemoglobin: 13 g/dL (ref 12.0–15.0)
Lymphocytes Relative: 30.1 % (ref 12.0–46.0)
Lymphs Abs: 2.2 10*3/uL (ref 0.7–4.0)
MCHC: 33.9 g/dL (ref 30.0–36.0)
MCV: 90.1 fl (ref 78.0–100.0)
Monocytes Absolute: 0.5 10*3/uL (ref 0.1–1.0)
Monocytes Relative: 7.4 % (ref 3.0–12.0)
Neutro Abs: 4.4 10*3/uL (ref 1.4–7.7)
Neutrophils Relative %: 61.4 % (ref 43.0–77.0)
Platelets: 323 10*3/uL (ref 150.0–400.0)
RBC: 4.28 Mil/uL (ref 3.87–5.11)
RDW: 12.9 % (ref 11.5–15.5)
WBC: 7.1 10*3/uL (ref 4.0–10.5)

## 2021-11-22 LAB — COMPREHENSIVE METABOLIC PANEL
ALT: 35 U/L (ref 0–35)
AST: 32 U/L (ref 0–37)
Albumin: 4.3 g/dL (ref 3.5–5.2)
Alkaline Phosphatase: 77 U/L (ref 39–117)
BUN: 14 mg/dL (ref 6–23)
CO2: 24 mEq/L (ref 19–32)
Calcium: 9.8 mg/dL (ref 8.4–10.5)
Chloride: 102 mEq/L (ref 96–112)
Creatinine, Ser: 0.81 mg/dL (ref 0.40–1.20)
GFR: 89.66 mL/min (ref >60.00)
Glucose, Bld: 96 mg/dL (ref 70–99)
Potassium: 3.6 mEq/L (ref 3.5–5.1)
Sodium: 135 mEq/L (ref 135–145)
Total Bilirubin: 0.3 mg/dL (ref 0.2–1.2)
Total Protein: 8 g/dL (ref 6.0–8.3)

## 2021-11-22 LAB — VITAMIN B12: Vitamin B-12: >1500 pg/mL — ABNORMAL HIGH (ref 211–911)

## 2021-11-22 LAB — VITAMIN D 25 HYDROXY (VIT D DEFICIENCY, FRACTURES): VITD: 67.36 ng/mL (ref 30.00–100.00)

## 2021-11-23 ENCOUNTER — Other Ambulatory Visit: Payer: Self-pay | Admitting: Internal Medicine

## 2021-11-23 DIAGNOSIS — N946 Dysmenorrhea, unspecified: Secondary | ICD-10-CM

## 2021-11-23 MED ORDER — NORGESTIMATE-ETH ESTRADIOL 0.25-35 MG-MCG PO TABS: 1.0000 | ORAL_TABLET | Freq: Every day | ORAL | 3 refills | Status: DC

## 2021-11-28 ENCOUNTER — Encounter: Payer: Self-pay | Admitting: Internal Medicine

## 2022-01-17 ENCOUNTER — Telehealth: Payer: Self-pay

## 2022-01-17 ENCOUNTER — Other Ambulatory Visit: Payer: Self-pay

## 2022-01-17 DIAGNOSIS — N946 Dysmenorrhea, unspecified: Secondary | ICD-10-CM

## 2022-01-17 MED ORDER — NORGESTIMATE-ETH ESTRADIOL 0.25-35 MG-MCG PO TABS: 1.0000 | ORAL_TABLET | Freq: Every day | ORAL | 3 refills | Status: DC

## 2022-01-17 NOTE — Telephone Encounter (Signed)
Medication refill has been sent:   Torunn N Weissinger  P [facility] (supporting Pasty Spillers McLean-Scocuzza, MD)1 month ago    Can you please write my subscription for birth control to skip the placebo pills so it's contagious dosing for three months. Dr. Chauncey Cruel allowed this for to the horrible cramping I was having during my periods. I called and requested this last week but heard no response yet

## 2022-01-17 NOTE — Telephone Encounter (Signed)
Patient states she thinks her gynecologist, who has left, was writing the prescription for more than 28 pills so she would not have to take the placebos.  Patient states she spoke with the pharmacy and they need a new prescription.  *Patient states her preferred pharmacy is [facility] in [place].

## 2022-01-18 NOTE — Telephone Encounter (Signed)
Did you want me to cancel the Rx refill ? And have her get a new OBGYN she stated her OBGYN is no longer there.

## 2022-01-22 ENCOUNTER — Encounter: Payer: Self-pay | Admitting: Obstetrics and Gynecology

## 2022-01-22 ENCOUNTER — Other Ambulatory Visit: Payer: Self-pay | Admitting: Obstetrics and Gynecology

## 2022-01-22 DIAGNOSIS — N946 Dysmenorrhea, unspecified: Secondary | ICD-10-CM

## 2022-01-22 MED ORDER — NORGESTIMATE-ETH ESTRADIOL 0.25-35 MG-MCG PO TABS: 1.0000 | ORAL_TABLET | Freq: Every day | ORAL | 1 refills | Status: DC

## 2022-01-22 NOTE — Telephone Encounter (Signed)
Patient called and stated that she needs the norgestimate-ethinyl estradiol (SPRINTEC 28) 0.25-35 MG-MCG tablet, with out the placebo pills. The reason she states is because she gets really bad cramps during her period. She  is requesting a 90 day supply without placebo, which would be a 3.6 month refill and to be sent to [facility].  Patient is requesting that someone from the office calls her about this.

## 2022-01-22 NOTE — Progress Notes (Signed)
Rx RF cont dosing OCPs to mail order to pt.

## 2022-01-22 NOTE — Telephone Encounter (Signed)
Spoke with pt in regards to this request, I explained to her that you suggested she reach out to [facility] for this medication. She verbalized understanding and stated she will just reach out to the OBGYN.

## 2022-02-06 ENCOUNTER — Encounter: Payer: Self-pay | Admitting: Obstetrics and Gynecology

## 2022-02-07 ENCOUNTER — Other Ambulatory Visit: Payer: Self-pay | Admitting: Obstetrics and Gynecology

## 2022-02-07 DIAGNOSIS — B009 Herpesviral infection, unspecified: Secondary | ICD-10-CM

## 2022-02-07 MED ORDER — VALACYCLOVIR HCL 1 G PO TABS: 1000.0000 mg | ORAL_TABLET | Freq: Every day | ORAL | 1 refills | Status: DC

## 2022-02-07 NOTE — Progress Notes (Signed)
Rx RF valtrex. Takes daily. PCP left, Rx runs out 2/24, annual due with me 3/24

## 2022-03-19 ENCOUNTER — Other Ambulatory Visit: Payer: Self-pay | Admitting: Obstetrics and Gynecology

## 2022-03-19 DIAGNOSIS — Z1231 Encounter for screening mammogram for malignant neoplasm of breast: Secondary | ICD-10-CM

## 2022-04-04 ENCOUNTER — Encounter: Payer: Self-pay | Admitting: Licensed Practical Nurse

## 2022-04-04 ENCOUNTER — Other Ambulatory Visit (HOSPITAL_COMMUNITY)
Admission: RE | Admit: 2022-04-04 | Discharge: 2022-04-04 | Disposition: A | Discharge status: Home / Self Care | Payer: Commercial Managed Care - PPO | Source: Ambulatory Visit | Attending: Licensed Practical Nurse | Admitting: Licensed Practical Nurse

## 2022-04-04 ENCOUNTER — Ambulatory Visit (INDEPENDENT_AMBULATORY_CARE_PROVIDER_SITE_OTHER): Payer: Commercial Managed Care - PPO | Admitting: Licensed Practical Nurse

## 2022-04-04 VITALS — BP 125/73 | HR 87 | Ht 62.0 in | Wt 133.0 lb

## 2022-04-04 DIAGNOSIS — Z113 Encounter for screening for infections with a predominantly sexual mode of transmission: Secondary | ICD-10-CM | POA: Diagnosis present

## 2022-04-04 DIAGNOSIS — N939 Abnormal uterine and vaginal bleeding, unspecified: Secondary | ICD-10-CM

## 2022-04-04 DIAGNOSIS — R1032 Left lower quadrant pain: Secondary | ICD-10-CM

## 2022-04-04 DIAGNOSIS — R1031 Right lower quadrant pain: Secondary | ICD-10-CM

## 2022-04-04 NOTE — Progress Notes (Signed)
SUBJECTIVE: Here for evaluation for cramping and bleeding with intercourse Cramping: has been happening over the last year, it in the lower abdomen, it is "intermittent" it occurs a couple times a month, sometimes it so severe she cannot stand up. At times she needs to use Midol, Motrin and a Heating pad. The cramping does not always occur with IC. She was evaluated in the ED about 1 year ago for this. She did not have a pelvic US. She has a BM every other day, they are easy to pass, she does not struggle with constipation. She does experience occasional gas and bloating. She is on a continuous use pill so she does not get a cycle. She cannot say that this cramping is cyclic. She denies any urinary symptoms.   Bleeding with Intercourse:this has been happening over the pasty year, it not with every episode of IC, it tends to happen more when the IC is "vigorous", she does use sex toys and has seen bleeding after use the toys. She denies other vaginal symptoms. She has  never been told that she has a polyp, cyst or fibroids.   [id number], SVB x2   OBJECTIVE: BP 125/73   Pulse 87   Ht 5' 2"$  (1.575 m)   Wt 133 lb (60.3 kg)   BMI 24.33 kg/m   Gen: NAD Abdomen: flat, soft, non tender no masses External genitalia WNL  SSE: cervix round with ectropy, pea sized nabothian cyst at 9 O'clock, at 3 O'clock tissue is friable, tissue noted to be soft and a little mobile but connected to the cervix-pt reported tenderness in this area when touched with a swab, could be an early polyp. White slightly thick discharge present  Bimanual exam: uterus non gravid size, smooth, mobile, no masses, adnexa non tender no masses.  Lat pap 3/13 NILM  ASSESSMENT: Cramping Bleeding with Intercourse   PLAN: -PAP and Aptima swab collected -Pelvic US ordered -reviewed exam with Dr Marcelline Mates, may consider course of Doxycycline if all swabs negative.   Roberto Scales, [facility]  04/04/22  5:37 PM

## 2022-04-05 ENCOUNTER — Encounter: Payer: Self-pay | Admitting: Internal Medicine

## 2022-04-05 ENCOUNTER — Ambulatory Visit (INDEPENDENT_AMBULATORY_CARE_PROVIDER_SITE_OTHER): Payer: Commercial Managed Care - PPO | Admitting: Internal Medicine

## 2022-04-05 VITALS — BP 120/74 | HR 86 | Ht 62.0 in | Wt 133.0 lb

## 2022-04-05 DIAGNOSIS — R7989 Other specified abnormal findings of blood chemistry: Secondary | ICD-10-CM

## 2022-04-05 NOTE — Progress Notes (Signed)
Name: Carolyn Johnston  MRN/ DOB: [id number], [birth year]    Age/ Sex: 42 y.o., female    PCP: Pcp, No   Reason for Endocrinology Evaluation: Elevates serum cortisol      Date of Initial Endocrinology Evaluation: 04/05/2022     HPI: Carolyn Johnston is a 42 y.o. female with a past medical history of vestibular migraine . The patient presented for initial endocrinology clinic visit on 04/05/2022 for consultative assistance with her high serum cortisol .   Pt was noted with high serum cortisol 27.9 ug/dL 06/2021, normal LH, FSH and prolactin as well as TFT's  She also has normal vitamin D and B12   Today she has been noted with fluctuating weight 58-61 kg over the past year   She has chronic  hair loss,  brittle nail and lanugo, she does follow-up with dermatology on minoxidil and spironolactone She is not on any biotin  Has skin sensitivity   Denies Palpitations unless when nervous  No changes in bowel movement  Denies tremors  She is on Sprintec  She is on Spironolactone for acne  She has lanugo on the face       HISTORY:  Past Medical History:  Past Medical History:  Diagnosis Date   Allergy    trees, dust mites   COVID-19    x2   Migraines    Past Surgical History:  Past Surgical History:  Procedure Laterality Date   adnoids suture     DILATION AND CURETTAGE OF UTERUS     TONSILLECTOMY     WISDOM TOOTH EXTRACTION      Social History:  reports that she quit smoking about 13 years ago. Her smoking use included cigarettes. She has never used smokeless tobacco. She reports that she does not drink alcohol and does not use drugs. Family History: family history includes Arthritis in her mother; Diabetes in her maternal grandmother; Drug abuse in her father; Fibroids in her mother; Headache in her mother; Parkinson's disease in her maternal grandfather; Rheum arthritis in her mother.   HOME MEDICATIONS: Allergies as of 04/05/2022   No Known Allergies       Medication List        Accurate as of April 05, 2022  4:15 PM. If you have any questions, ask your nurse or doctor.          STOP taking these medications    cyclobenzaprine 5 MG tablet Commonly known as: FLEXERIL Stopped by: Dorita Sciara, MD       TAKE these medications    amitriptyline 25 MG tablet Commonly known as: ELAVIL Take 25 mg by mouth in the morning and at bedtime. Taking 50 mg in total a day   Emgality 120 MG/ML Soaj Generic drug: Galcanezumab-gnlm SMARTSIG:120 Milligram(s) SUB-Q Every 4 Weeks   minoxidil 2.5 MG tablet Commonly known as: LONITEN Take 1 tablet (2.5 mg total) by mouth daily.   norgestimate-ethinyl estradiol 0.25-35 MG-MCG tablet Commonly known as: Sprintec 28 Take 1 tablet by mouth daily. CONTINUOUS DOSING   spironolactone 100 MG tablet Commonly known as: ALDACTONE Take 100 mg by mouth daily.   Ubrelvy 100 MG Tabs Generic drug: Ubrogepant Take by mouth.   valACYclovir 1000 MG tablet Commonly known as: VALTREX Take 1 tablet (1,000 mg total) by mouth daily.          REVIEW OF SYSTEMS: A comprehensive ROS was conducted with the patient and is negative except as per HPI  OBJECTIVE:  VS: BP 120/74 (BP Location: Left Arm, Patient Position: Sitting, Cuff Size: Small)   Pulse 86   Ht 5' 2"$  (1.575 m)   Wt 133 lb (60.3 kg)   SpO2 96%   BMI 24.33 kg/m    Wt Readings from Last 3 Encounters:  04/05/22 133 lb (60.3 kg)  04/04/22 133 lb (60.3 kg)  11/21/21 136 lb (61.7 kg)     EXAM: General: Pt appears well and is in NAD  Eyes: External eye exam normal without stare, lid lag or exophthalmos.  EOM intact.  PERRL.  Neck: General: Supple without adenopathy. Thyroid: Thyroid size normal.  No goiter or nodules appreciated. No thyroid bruit.  Lungs: Clear with good BS bilat with no rales, rhonchi, or wheezes  Heart: Auscultation: RRR.  Abdomen: Normoactive bowel sounds, soft, nontender, without masses or organomegaly  palpable  Extremities:  BL LE: No pretibial edema normal ROM and strength.  Mental Status: Judgment, insight: Intact Orientation: Oriented to time, place, and person Mood and affect: No depression, anxiety, or agitation     DATA REVIEWED:     Latest Reference Range & Units 06/27/21 08:21  Cortisol - AM 6.2 - 19.4 ug/dL 27.9 (H)    Latest Reference Range & Units 03/22/21 09:45  LH mIU/mL 0.18  FSH mIU/ML 0.1  Prolactin ng/mL 6.1  Glucose 70 - 99 mg/dL 84  Estrogen pg/mL 166.1  Free Testosterone 0.1 - 6.4 pg/mL 0.7  Sex Horm Binding Glob, Serum 17 - 124 nmol/L 152 (H)  Testosterone, Total, LC-MS-MS 2 - 45 ng/dL 12  PTH, Intact 16 - 77 pg/mL 46  TSH 0.35 - 5.50 uIU/mL 0.63  Triiodothyronine (T3) 76 - 181 ng/dL 176  Thyroxine (T4) 5.1 - 11.9 mcg/dL 9.6    Old records , labs and images have been reviewed.   ASSESSMENT/PLAN/RECOMMENDATIONS:   Elevated serum cortisol:   -Elevated serum cortisol is due to elevation of cortisol binding globulin that is caused by being on estrogen contraceptive pills -She has no clinical evidence of Cushing syndrome -We will proceed with 24-hour urinary cortisol   Follow-up pending lab results  Signed electronically by: Mack Guise, MD  [facility]  [facility] [address] [place], [place] [address] Phone: [phone] FAX: [phone]   CC: Pcp, No No address on file Phone: None Fax: None   Return to Endocrinology clinic as below: Future Appointments  Date Time Provider [place]  04/15/2022  2:30 PM [facility]  05/02/2022  4:00 PM [place] [facility]-2 [facility]  05/06/2022  [id number] PM Copland, Elmo Putt B, PA-C [facility] None  07/22/2022  3:00 PM Carollee Leitz, MD [facility]

## 2022-04-05 NOTE — Patient Instructions (Signed)

## 2022-04-08 ENCOUNTER — Other Ambulatory Visit: Payer: Commercial Managed Care - PPO

## 2022-04-08 DIAGNOSIS — R7989 Other specified abnormal findings of blood chemistry: Secondary | ICD-10-CM

## 2022-04-08 LAB — CERVICOVAGINAL ANCILLARY ONLY
Bacterial Vaginitis (gardnerella): NEGATIVE
Candida Glabrata: NEGATIVE
Candida Vaginitis: NEGATIVE
Chlamydia: NEGATIVE
Comment: NEGATIVE
Comment: NEGATIVE
Comment: NEGATIVE
Comment: NEGATIVE
Comment: NEGATIVE
Comment: NORMAL
Neisseria Gonorrhea: NEGATIVE
Trichomonas: NEGATIVE

## 2022-04-08 LAB — CYTOLOGY - PAP
Comment: NEGATIVE
Diagnosis: NEGATIVE
High risk HPV: NEGATIVE

## 2022-04-08 NOTE — Progress Notes (Unsigned)
Total volume 1250 Ml Start date 04/06/2022 start Time 830am End date 04/07/2016    end time 830 am

## 2022-04-09 ENCOUNTER — Other Ambulatory Visit: Payer: Self-pay | Admitting: Licensed Practical Nurse

## 2022-04-09 ENCOUNTER — Other Ambulatory Visit: Payer: Self-pay

## 2022-04-09 DIAGNOSIS — B3731 Acute candidiasis of vulva and vagina: Secondary | ICD-10-CM

## 2022-04-09 DIAGNOSIS — N72 Inflammatory disease of cervix uteri: Secondary | ICD-10-CM

## 2022-04-09 MED ORDER — FLUCONAZOLE 150 MG PO TABS: 150.0000 mg | ORAL_TABLET | ORAL | 0 refills | Status: AC

## 2022-04-09 MED ORDER — DOXYCYCLINE MONOHYDRATE 100 MG PO CAPS: 100.0000 mg | ORAL_CAPSULE | Freq: Two times a day (BID) | ORAL | 0 refills | Status: DC

## 2022-04-09 NOTE — Progress Notes (Signed)
TC  to Carolyn Johnston, your lab results were all normal.  Per Dr Marcelline Mates your cervix is irritated by something, we may try a course of doxycycline. Pt in agreement, desires script for Diflucan incase of yeast infection with antibiotic treatment. Also reviewed that she may have a cervical polyp forming, at this time it is not obvious but in the future there maybe a polyp that could be removed. If she continues to have bleeding with IC, she should return to the clinic for an assessment. Sometime we can use Silver Nitrate to affected area. Pt in agreement.   Script doe doxycyline 126m BID x7 days and Diflucan [facility]  04/09/22  5:10 PM

## 2022-04-14 LAB — CORTISOL, URINE, 24 HOUR
24 Hour urine volume (VMAHVA): 1250 mL
CREATININE, URINE: 1.26 g/(24.h) (ref 0.50–2.15)
Cortisol (Ur), Free: 35.5 mcg/24 h (ref 4.0–50.0)

## 2022-04-15 ENCOUNTER — Ambulatory Visit
Admission: RE | Admit: 2022-04-15 | Discharge: 2022-04-15 | Disposition: A | Discharge status: Home / Self Care | Payer: Commercial Managed Care - PPO | Source: Ambulatory Visit | Attending: Licensed Practical Nurse | Admitting: Licensed Practical Nurse

## 2022-04-15 DIAGNOSIS — R1031 Right lower quadrant pain: Secondary | ICD-10-CM | POA: Diagnosis present

## 2022-04-15 DIAGNOSIS — R1032 Left lower quadrant pain: Secondary | ICD-10-CM | POA: Diagnosis present

## 2022-05-02 ENCOUNTER — Ambulatory Visit
Admission: RE | Admit: 2022-05-02 | Discharge: 2022-05-02 | Disposition: A | Discharge status: Home / Self Care | Payer: Commercial Managed Care - PPO | Source: Ambulatory Visit | Attending: Obstetrics and Gynecology | Admitting: Obstetrics and Gynecology

## 2022-05-02 DIAGNOSIS — Z1231 Encounter for screening mammogram for malignant neoplasm of breast: Secondary | ICD-10-CM | POA: Insufficient documentation

## 2022-05-05 NOTE — Progress Notes (Signed)
PCP:  Pcp, No   No chief complaint on file.    HPI:      [name] is a 42 y.o. G3P0010 whose LMP was No LMP recorded. (Menstrual status: Oral contraceptives)., presents today for her annual examination.  Her menses are absent with cont dosing of OCPs, has rare BTB/dysmen.     Sex activity: not currently sexually active, contraception - OCP (estrogen/progesterone).  Last Pap: 04/04/22 Results were: no abnormalities /neg HPV DNA   Last mammogram: 05/02/22  Results were: normal--routine follow-up in 12 months  ???? There is no FH of breast cancer. There is no FH of ovarian cancer. The patient does do self-breast exams.  Tobacco use: The patient denies current or previous tobacco use. Alcohol use: none No drug use.  Exercise: moderately active  She does get adequate calcium and Vitamin D in her diet.  Has had issues with hair loss/thinning on head and arms for 4-5 yrs. Pt had extensive normal labs, including hormone levels, with PCP. Was referred to derm who diagnosed with her female pattern baldness; pt then was referred to endocrine who declined the referral. Pt then send to [facility] in [place] for hormone testing but hasn't seen them yet. Pt is doing hair/nail supplements. No female pattern baldness in fam. Washes hair once wkly to cut down on hair loss, does have new hair growth. Pt thinks she has been on emgality and OCPs for the same amt of time as hair loss.   Patient Active Problem List   Diagnosis Date Noted   High serum cortisol 06/28/2021   Brittle nails 06/26/2021   Hair loss 03/24/2021   Birth control counseling 03/24/2021   Herpes 03/24/2021   Vitamin D insufficiency 06/06/2016   Recurrent occipital headache 04/22/2016   Bilateral occipital neuralgia 04/22/2016   Cervico-occipital neuralgia of right side 04/22/2016   Cervico-occipital neuralgia of left side 04/22/2016   Chronic pain syndrome 04/22/2016   Vestibular migraine 06/01/2013    Past  Surgical History:  Procedure Laterality Date   adnoids suture     DILATION AND CURETTAGE OF UTERUS     TONSILLECTOMY     WISDOM TOOTH EXTRACTION      Family History  Problem Relation Age of Onset   Arthritis Mother        RA since 73s   Headache Mother    Fibroids Mother    Rheum arthritis Mother    Drug abuse Father    Diabetes Maternal Grandmother    Parkinson's disease Maternal Grandfather     Social History   Socioeconomic History   Marital status: Single    Spouse name: Not on file   Number of children: Not on file   Years of education: Not on file   Highest education level: Not on file  Occupational History   Not on file  Tobacco Use   Smoking status: Former    Types: Cigarettes    Quit date: 06/29/2008    Years since quitting: 13.8   Smokeless tobacco: Never  Vaping Use   Vaping Use: Never used  Substance and Sexual Activity   Alcohol use: No   Drug use: No   Sexual activity: Yes    Birth control/protection: Pill  Other Topics Concern   Not on file  Social History Narrative   Works  [employer]   2 daughter (46 and 69 as of 11/21/21)   Social Determinants of Health   Financial Resource Strain: Not on file  Food Insecurity: Not on file  Transportation Needs: Not on file  Physical Activity: Not on file  Stress: Not on file  Social Connections: Not on file  Intimate Partner Violence: Not on file     Current Outpatient Medications:    amitriptyline (ELAVIL) 25 MG tablet, Take 25 mg by mouth in the morning and at bedtime. Taking 50 mg in total a day, Disp: , Rfl:    doxycycline (MONODOX) 100 MG capsule, Take 1 capsule (100 mg total) by mouth 2 (two) times daily., Disp: 14 capsule, Rfl: 0   EMGALITY 120 MG/ML SOAJ, SMARTSIG:120 Milligram(s) SUB-Q Every 4 Weeks, Disp: , Rfl:    minoxidil (LONITEN) 2.5 MG tablet, Take 1 tablet (2.5 mg total) by mouth daily., Disp: 90 tablet, Rfl: 3   norgestimate-ethinyl estradiol (SPRINTEC 28) 0.25-35 MG-MCG tablet, Take 1  tablet by mouth daily. CONTINUOUS DOSING, Disp: 120 tablet, Rfl: 1   spironolactone (ALDACTONE) 100 MG tablet, Take 100 mg by mouth daily., Disp: , Rfl:    UBRELVY 100 MG TABS, Take by mouth., Disp: , Rfl:    valACYclovir (VALTREX) 1000 MG tablet, Take 1 tablet (1,000 mg total) by mouth daily., Disp: 90 tablet, Rfl: 1     ROS:  Review of Systems  Constitutional:  Negative for fatigue, fever and unexpected weight change.  Respiratory:  Negative for cough, shortness of breath and wheezing.   Cardiovascular:  Negative for chest pain, palpitations and leg swelling.  Gastrointestinal:  Negative for blood in stool, constipation, diarrhea, nausea and vomiting.  Endocrine: Negative for cold intolerance, heat intolerance and polyuria.  Genitourinary:  Negative for dyspareunia, dysuria, flank pain, frequency, genital sores, hematuria, menstrual problem, pelvic pain, urgency, vaginal bleeding, vaginal discharge and vaginal pain.  Musculoskeletal:  Negative for back pain, joint swelling and myalgias.  Skin:  Negative for rash.  Neurological:  Negative for dizziness, syncope, light-headedness, numbness and headaches.  Hematological:  Negative for adenopathy.  Psychiatric/Behavioral:  Negative for agitation, confusion, sleep disturbance and suicidal ideas. The patient is not nervous/anxious.    BREAST: No symptoms   Objective: There were no vitals taken for this visit.   Physical Exam Constitutional:      Appearance: She is well-developed.  Genitourinary:     Vulva normal.     Right Labia: No rash, tenderness or lesions.    Left Labia: No tenderness, lesions or rash.    No vaginal discharge, erythema or tenderness.      Right Adnexa: not tender and no mass present.    Left Adnexa: not tender and no mass present.    No cervical friability or polyp.     Uterus is not enlarged or tender.  Breasts:    Right: No mass, nipple discharge, skin change or tenderness.     Left: No mass, nipple  discharge, skin change or tenderness.  Neck:     Thyroid: No thyromegaly.  Cardiovascular:     Rate and Rhythm: Normal rate and regular rhythm.     Heart sounds: Normal heart sounds. No murmur heard. Pulmonary:     Effort: Pulmonary effort is normal.     Breath sounds: Normal breath sounds.  Abdominal:     Palpations: Abdomen is soft.     Tenderness: There is no abdominal tenderness. There is no guarding or rebound.  Musculoskeletal:        General: Normal range of motion.     Cervical back: Normal range of motion.  Lymphadenopathy:     Cervical: No  cervical adenopathy.  Neurological:     General: No focal deficit present.     Mental Status: She is alert and oriented to person, place, and time.     Cranial Nerves: No cranial nerve deficit.  Skin:    General: Skin is warm and dry.  Psychiatric:        Mood and Affect: Mood normal.        Behavior: Behavior normal.        Thought Content: Thought content normal.        Judgment: Judgment normal.  Vitals reviewed.     Assessment/Plan: Encounter for annual routine gynecological examination  Cervical cancer screening - Plan: Cytology - PAP  Screening for HPV (human papillomavirus) - Plan: Cytology - PAP  Encounter for surveillance of contraceptive pills - Plan: desogestrel-ethinyl estradiol (APRI) 0.15-30 MG-MCG tablet; OCP change due to hair loss. Does cont dosing, f/u prn.   Encounter for screening mammogram for malignant neoplasm of breast; pt current on mammo  Hair loss--discussed normal labs, and hormone labs while on OCPs are not accurate. Pt with low testosterone levels as expected on OCPs. Discussed med side effect, but not an issue with emgality. Will change OCPs to see if helps with hair loss. Cont hair supplements, change shampoo/conditioner in case due to chemicals in product.   No orders of the defined types were placed in this encounter.            GYN counsel adequate intake of calcium and vitamin D, diet and  exercise     F/U  No follow-ups on file.  Jasmene Goswami B. Eunice Oldaker, PA-C 05/05/2022 8:19 PM

## 2022-05-06 ENCOUNTER — Ambulatory Visit (INDEPENDENT_AMBULATORY_CARE_PROVIDER_SITE_OTHER): Payer: Commercial Managed Care - PPO | Admitting: Obstetrics and Gynecology

## 2022-05-06 ENCOUNTER — Encounter: Payer: Self-pay | Admitting: Obstetrics and Gynecology

## 2022-05-06 VITALS — BP 116/60 | Ht 62.0 in | Wt 134.0 lb

## 2022-05-06 DIAGNOSIS — Z01419 Encounter for gynecological examination (general) (routine) without abnormal findings: Secondary | ICD-10-CM

## 2022-05-06 DIAGNOSIS — Z1231 Encounter for screening mammogram for malignant neoplasm of breast: Secondary | ICD-10-CM

## 2022-05-06 DIAGNOSIS — Z3041 Encounter for surveillance of contraceptive pills: Secondary | ICD-10-CM

## 2022-05-06 DIAGNOSIS — N946 Dysmenorrhea, unspecified: Secondary | ICD-10-CM

## 2022-05-06 DIAGNOSIS — N930 Postcoital and contact bleeding: Secondary | ICD-10-CM

## 2022-05-06 MED ORDER — NORGESTIMATE-ETH ESTRADIOL 0.25-35 MG-MCG PO TABS: 1.0000 | ORAL_TABLET | Freq: Every day | ORAL | 3 refills | Status: DC

## 2022-05-06 NOTE — Patient Instructions (Signed)
I value your feedback and you entrusting us with your care. If you get a Reader patient survey, I would appreciate you taking the time to let us know about your experience today. Thank you! ? ? ?

## 2022-07-22 ENCOUNTER — Ambulatory Visit
Admission: RE | Admit: 2022-07-22 | Discharge: 2022-07-22 | Disposition: A | Discharge status: Home / Self Care | Payer: Commercial Managed Care - PPO | Source: Ambulatory Visit | Attending: Family Medicine | Admitting: Family Medicine

## 2022-07-22 ENCOUNTER — Ambulatory Visit (INDEPENDENT_AMBULATORY_CARE_PROVIDER_SITE_OTHER): Payer: Commercial Managed Care - PPO | Admitting: Family Medicine

## 2022-07-22 ENCOUNTER — Encounter: Payer: Self-pay | Admitting: Family Medicine

## 2022-07-22 VITALS — BP 100/64 | HR 65 | Temp 98.5°F | Ht 62.0 in | Wt 127.6 lb

## 2022-07-22 DIAGNOSIS — M542 Cervicalgia: Secondary | ICD-10-CM | POA: Diagnosis not present

## 2022-07-22 DIAGNOSIS — M5481 Occipital neuralgia: Secondary | ICD-10-CM | POA: Diagnosis present

## 2022-07-22 DIAGNOSIS — G43809 Other migraine, not intractable, without status migrainosus: Secondary | ICD-10-CM

## 2022-07-22 DIAGNOSIS — R7989 Other specified abnormal findings of blood chemistry: Secondary | ICD-10-CM | POA: Diagnosis not present

## 2022-07-22 DIAGNOSIS — E538 Deficiency of other specified B group vitamins: Secondary | ICD-10-CM

## 2022-07-22 DIAGNOSIS — G8929 Other chronic pain: Secondary | ICD-10-CM

## 2022-07-22 DIAGNOSIS — L659 Nonscarring hair loss, unspecified: Secondary | ICD-10-CM

## 2022-07-22 DIAGNOSIS — E559 Vitamin D deficiency, unspecified: Secondary | ICD-10-CM

## 2022-07-22 DIAGNOSIS — L709 Acne, unspecified: Secondary | ICD-10-CM

## 2022-07-22 DIAGNOSIS — Z1159 Encounter for screening for other viral diseases: Secondary | ICD-10-CM

## 2022-07-22 DIAGNOSIS — R7309 Other abnormal glucose: Secondary | ICD-10-CM

## 2022-07-22 DIAGNOSIS — Z114 Encounter for screening for human immunodeficiency virus [HIV]: Secondary | ICD-10-CM

## 2022-07-22 MED ORDER — TIZANIDINE HCL 4 MG PO TABS: 4.0000 mg | ORAL_TABLET | Freq: Four times a day (QID) | ORAL | 0 refills | Status: DC | PRN

## 2022-07-22 NOTE — Patient Instructions (Addendum)
It was a pleasure meeting you today. Thank you for allowing me to take part in your health care.  Our goals for today as we discussed include:  Start Tizanidine 4mg  three times a day as needed Referral sent for physical therapy  Will get xrays if neck Go to [facility]  Schedule fasting lab appoiontment.  Fast 10 hours  Follow up if no improvement in symptoms   If you have any questions or concerns, please do not hesitate to call the office at [phone].  I look forward to our next visit and until then take care and stay safe.  Regards,   Dana Allan, MD   [facility]
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
## 2022-07-27 ENCOUNTER — Encounter: Payer: Self-pay | Admitting: Family Medicine

## 2022-07-27 DIAGNOSIS — R7309 Other abnormal glucose: Secondary | ICD-10-CM | POA: Insufficient documentation

## 2022-07-27 DIAGNOSIS — Z1159 Encounter for screening for other viral diseases: Secondary | ICD-10-CM | POA: Insufficient documentation

## 2022-07-27 DIAGNOSIS — Z114 Encounter for screening for human immunodeficiency virus [HIV]: Secondary | ICD-10-CM | POA: Insufficient documentation

## 2022-07-27 DIAGNOSIS — E538 Deficiency of other specified B group vitamins: Secondary | ICD-10-CM | POA: Insufficient documentation

## 2022-07-27 DIAGNOSIS — G8929 Other chronic pain: Secondary | ICD-10-CM | POA: Insufficient documentation

## 2022-07-27 NOTE — Assessment & Plan Note (Signed)
Chronic Takes Minodoxil 2.5 mg daily and Spironolactone 100 mg daily Recent potassium 3.6 Follows with Dermatology

## 2022-07-27 NOTE — Assessment & Plan Note (Signed)
Chronic. Evaluated by Endocrinology, [place] Likely secondary to estrogen contraception.

## 2022-07-27 NOTE — Assessment & Plan Note (Signed)
Chronic ON Emgality 120 mg injection monthly Recently start Botox injections Follows with Neurology
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
## 2022-07-28 ENCOUNTER — Encounter: Payer: Self-pay | Admitting: Family Medicine

## 2022-07-28 DIAGNOSIS — G8929 Other chronic pain: Secondary | ICD-10-CM

## 2022-08-01 NOTE — Therapy (Signed)
OUTPATIENT PHYSICAL THERAPY EVALUATION   Patient Name: Carolyn Johnston MRN: [id number] DOB:[birth year], 42 y.o., female Today's Date: 08/06/2022  END OF SESSION:  PT End of Session - 08/06/22 1507     Visit Number 1    Number of Visits 13    Date for PT Re-Evaluation 10/29/22    Authorization Type [employer] PPO reporting from 08/06/2022    Authorization Time Period max combined 60 PT/OT/SLP per year    Authorization - Number of Visits 1    Progress Note Due on Visit 60    PT Start Time 1303    PT Stop Time 1347    PT Time Calculation (min) 44 min    Activity Tolerance Patient tolerated treatment well    Behavior During Therapy WFL for tasks assessed/performed             Past Medical History:  Diagnosis Date   Allergy    trees, dust mites   COVID-19    x2   Migraines    Past Surgical History:  Procedure Laterality Date   adnoids suture     DILATION AND CURETTAGE OF UTERUS     TONSILLECTOMY     WISDOM TOOTH EXTRACTION     Patient Active Problem List   Diagnosis Date Noted   Neck pain, chronic 07/27/2022   B12 deficiency 07/27/2022   Encounter for screening for HIV 07/27/2022   Need for hepatitis C screening test 07/27/2022   Abnormal glucose 07/27/2022   High serum cortisol 06/28/2021   Brittle nails 06/26/2021   Hair loss 03/24/2021   Birth control counseling 03/24/2021   Herpes 03/24/2021   Vitamin D insufficiency 06/06/2016   Recurrent occipital headache 04/22/2016   Bilateral occipital neuralgia 04/22/2016   Cervico-occipital neuralgia of right side 04/22/2016   Cervico-occipital neuralgia of left side 04/22/2016   Chronic pain syndrome 04/22/2016   Vestibular migraine 06/01/2013    PCP: Dana Allan, MD  REFERRING PROVIDER: Dana Allan, MD  REFERRING DIAG: neck pain, chronic  THERAPY DIAG:  Cervicalgia  Nonintractable headache, unspecified chronicity pattern, unspecified headache type  Other muscle spasm  Rationale for Evaluation and  Treatment: Rehabilitation  ONSET DATE: 10 years prior to PT evaluation.   SUBJECTIVE:                                                                                                                                                                                                         SUBJECTIVE STATEMENT: Patient reports she has been noticing neck pain for some time. She got diagnosed with migraines  approximately 10 years ago (2014). As she continues with her migraines, she kept telling her neurologist that her neck is feeling stiff. Her neck was sore and stiff. The neurologist did pursue it further. About 2 months ago she noticed her neck was not feeling good, so she had a transfer of care to a new doctor. In between that time between the time she was waiting to see the new doctor she ordered a neck pillow and a cervical traction device. She was using these and she felt like a pinched nerve on one side that moved to the other side. Her new doctor ordered an xray and that is when she had C6-C7 degeneration. Patient has always wondered if she has some sort of arthritis in her head since she has not been able to cure her migraines and they happen associated with the rain. She states she only has migraines when it is going to rain. The transfer of care was to her new PCP. To treat her migraines, patient gets a Botox injection every 3 months. Patient brought the idea of botox to her neurologist because she thought that might help with the neck tightness. She has had one injection but cannot tell if she has noticed any difference yet. She had really bad migraines the first two weeks after the injection. She was injected at 31 sites around her forehead sides of her head, suboccipital region and upper traps. . She had this done in April. She originally had vestibular symptoms with her migraines, but it has been years since that happens. She has two forms of migraines. The one that bothers her the most is triggered  by changes in weather, pressure, rain, clouds and bad snow systems that are going to bring a lot of sleet and hail, anything to do with not being sunny. She also has sex migraines that only happens with climax but are hit or miss.   Neck pain is present without migraines, but neck pain is always there with migraines. She feels stiffness and her neck pain is the worst before her migraine hits.    She tried a neck pillow and it seemed to make things worse. She tried a home traction unit that was a solid pillow and she stopped doing that. She has her daughter use a towel to provide traction that provides a lot of relief. She takes medication for migraines but she cannot get them under control. She takes an injection for migraines monthly that seemed to slightly reduce the frequency of the migraines (about half).   She currently has migraines whenever it is going to rain. It usually lasts until the weather pressure breaks.   She does endorse tingling/numbness in her arm down to her hand on whichever side she is laying on. This is relived when she moves.     PERTINENT HISTORY:  Patient is a 42 y.o. female who presents to outpatient physical therapy with a referral for medical diagnosis chronic neck pain. This patient's chief complaints consist of chronic neck pain and incompletely controlled migraine headaches leading to the following functional deficits: difficulty sleeping, moving head, working, driving, leisure activities. Relevant past medical history and comorbidities include Vestibular migraine; Recurrent occipital headache; Bilateral occipital neuralgia; Cervico-occipital neuralgia of right side; Cervico-occipital neuralgia of left side; Chronic pain syndrome; Vitamin D insufficiency; Hair loss; Herpes; Brittle nails; High serum cortisol; Neck pain, chronic; B12 deficiency; and Abnormal glucose, former smoker.  Patient denies hx of cancer, stroke, seizures, lung problems, heart problems, diabetes,  unexplained weight loss, unexplained changes in bowel or bladder problems, unexplained stumbling or dropping things, osteoporosis, and spinal surgery    PAIN:  Are you having pain? Yes: NPRS scale: Current: 4/10,  Best: 3/10, Worst: 9/10. Pain location: posterior mid neck feeling stiff. "When nerves pinched" she felt pain down her back to bra strap.  Pain description: constantly dull, stiffness at back of neck, tension in upper traps. Paresthesia in ipsilaterally arm when she lays on that side.  Aggravating factors: end range neck flexion and extension (lower cervical spine), moving head, worst in morning,  Relieving factors: daughter providing traction with towel, moving head around when nerves felt better.   FUNCTIONAL LIMITATIONS: difficulty sleeping, moving head, working, driving, leisure activities.   LEISURE: camping, fishing, dancing, being outside in the sun, walking  PRECAUTIONS: None  WEIGHT BEARING RESTRICTIONS: No  FALLS:  Has patient fallen in last 6 months? No Not concerned about falling  LIVING ENVIRONMENT: Lives with: alone with 50/50 custody with 79 and 15 year old children.  Lives in: single story home Stairs: 3 steps in the front with bilateral handrails (unsure if can reach both), 4 steps in the back with no handrail.  Does not need assistive devices.   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   PLOF: Independent, someone at work lifts the very heavy parts for her.   PATIENT GOALS: "to see how it can improve"   OBJECTIVE  DIAGNOSTIC FINDINGS:  Cervical spine xray report from 07/26/2022:  CLINICAL DATA:  worsening cervical neck pain   EXAM: CERVICAL SPINE - COMPLETE 4+ VIEW   COMPARISON:  April 22, 2016   FINDINGS: The cervical spine is visualized from C1-C7. Cervical alignment is maintained. Vertebral body heights are maintained: no evidence of acute  fracture. Mild intervertebral disc space height loss at C6-7. No high-grade osseous neuroforaminal narrowing. Mild bilateral osseous neuroforaminal narrowing at C6-7 secondary to uncovertebral hypertrophy. No prevertebral soft tissue swelling. Visualized thorax is unremarkable.   IMPRESSION: Mild degenerative changes at C6-7.     Electronically Signed   By: Meda Klinefelter M.D.   On: 07/26/2022 17:13  SELF- REPORTED FUNCTION FOTO score: 57/100 (neck questionnaire)  OBSERVATION/INSPECTION Posture Posture (seated): WFL Anthropometrics Tremor: none Body composition: BMI: 23.2  Muscle bulk: WNL Skin: WNL where visualized Edema: none Functional Mobility Bed mobility: supine <> sit and rolling WNL Transfers: sit <> stand WNL Gait: grossly WNL for household and short community ambulation. More detailed gait analysis deferred to later date as needed.   NEUROLOGICAL Dermatomes C2-T1 appears equal and intact to light touch   SPINE MOTION CERVICAL SPINE AROM *Indicates pain Flexion: 65 increased pain at base of neck and increased tension over B UE Extension: 50 increased pain at base of neck Side Flexion:   R 40 contralateral tension  L 47 contralateral tension Rotation:  R 60  L 65 ipsilateral pain, and contralateral pain at upper cervical spine Protraction: ROM WFL "I don't like it" "feels shortened" Retraction: 1.25 inches, increased pain right thoracic spine near T7.   PERIPHERAL JOINT MOTION (in degrees) ACTIVE RANGE OF MOTION (AROM) 08/06/2022: B UE grossly WNL.   MUSCLE PERFORMANCE (MMT):  Not formally tested. No strength complaint and WFL for activities assessed today.   SPECIAL TESTS: CERVICAL SPINE Cervical spine axial compression: increased pain at right neck Spurling's part B:  R = increased pain at right neck, L = increased pain at back of  neck Cervical spine axial distraction: no increased pain  ACCESSORY MOTION: Tender to CPA throughout entire  cervical spine and upper thoracic spine.   PALPATION: TTP at upper cervical spine, right side paraspinals, B UT/LS (R > L).    TODAY'S TREATMENT:  Therapeutic exercise: to centralize symptoms and improve ROM, strength, muscular endurance, and activity tolerance required for successful completion of functional activities.  - hooklying deep cervical spine flexor nod, 1x5 with 5 second hold plus more reps to learn technique.  - Education on diagnosis, prognosis, POC, anatomy and physiology of current condition.  - Education on HEP including handout   Pt required multimodal cuing for proper technique and to facilitate improved neuromuscular control, strength, range of motion, and functional ability resulting in improved performance and form.  PATIENT EDUCATION:  Education details: Exercise purpose/form. Self management techniques. Education on diagnosis, prognosis, POC, anatomy and physiology of current condition. Education on HEP including handout  Person educated: Patient Education method: Explanation, Demonstration, Tactile cues, Verbal cues, and Handouts Education comprehension: verbalized understanding, returned demonstration, and needs further education  HOME EXERCISE PROGRAM: Access Code: [email or web address] URL: [email or web address] Date: 08/06/2022 Prepared by: Norton Blizzard  Exercises - Supine Head Nod Deep Neck Flexor Training  - 1-2 x daily - 2 sets - 10 reps - 5 seconds hold  ASSESSMENT:  CLINICAL IMPRESSION: Patient is a 42 y.o. female referred to outpatient physical therapy with a medical diagnosis of chronic neck pain who presents with signs and symptoms consistent with chronic neck pain and migraine headaches that could have neck pain as a trigger. Patient is especially tender to palpation in suboccipital regions which is consistent with cervicogenic headache source.  Patient presents with significant pain, ROM, muscle tension, muscle performance  (strength/power/endurance), activity tolerance, and motor control impairments that are limiting ability to complete her usual activities including sleeping, moving head, working, driving, leisure activities without difficulty. Patient will benefit from skilled physical therapy intervention to address current body structure impairments and activity limitations to improve function and work towards goals set in current POC in order to return to prior level of function or maximal functional improvement.     OBJECTIVE IMPAIRMENTS: decreased activity tolerance, decreased coordination, decreased endurance, decreased knowledge of condition, decreased ROM, decreased strength, impaired perceived functional ability, increased muscle spasms, impaired flexibility, and pain.   ACTIVITY LIMITATIONS: carrying, lifting, bending, and sleeping  PARTICIPATION LIMITATIONS: occupation and   difficulty sleeping, moving head, working, driving, leisure activities  PERSONAL FACTORS: Fitness, Past/current experiences, Time since onset of injury/illness/exacerbation, and 3+ comorbidities:   Vestibular migraine; Recurrent occipital headache; Bilateral occipital neuralgia; Cervico-occipital neuralgia of right side; Cervico-occipital neuralgia of left side; Chronic pain syndrome; Vitamin D insufficiency; Hair loss; Herpes; Brittle nails; High serum cortisol; Neck pain, chronic; B12 deficiency; and Abnormal glucose, former smoker are also affecting patient's functional outcome.   REHAB POTENTIAL: Good  CLINICAL DECISION MAKING: Stable/uncomplicated  EVALUATION COMPLEXITY: Low   GOALS: Goals reviewed with patient? No  SHORT TERM GOALS: Target date: 08/20/2022  Patient will be independent with initial home exercise program for self-management of symptoms. Baseline: Initial HEP provided at IE (08/06/22); Goal status: INITIAL   LONG TERM GOALS: Target date: 10/29/2022  Patient will be independent with a long-term home  exercise program for self-management of symptoms.  Baseline: Initial HEP provided at IE (08/06/22); Goal status: INITIAL  2.  Patient will demonstrate improved FOTO to equal or greater than 67 by visit #10 to demonstrate improvement in overall condition and self-reported functional ability.  Baseline: 57 (08/06/22); Goal status: INITIAL  3.  Patient will report neck pain with functional activity at equal or less than 3/10 to improve her ability to sleep, complete work activities, and move her head with less difficulty.  Baseline: up to 9/10 (08/06/22); Goal status: INITIAL  4.  Patient will demonstrate increase in cervical spine ROM by 5 degrees in rotation, flexion, and extension with no increase in pain except intermittent end range discomfort to improve her ability to move her head while completing daily activities.  Baseline: see objective  (08/06/22); Goal status: INITIAL  5.  Patient will complete community, work and/or recreational activities with 50% less  limitation due to current condition.  Baseline: difficulty sleeping, moving head, working, driving, leisure activities (08/06/22); Goal status: INITIAL   PLAN:  PT FREQUENCY: 1-2x/week  PT DURATION: 12 weeks  PLANNED INTERVENTIONS: Therapeutic exercises, Therapeutic activity, Neuromuscular re-education, Patient/Family education, Self Care, Joint mobilization, Aquatic Therapy, Dry Needling, Electrical stimulation, Spinal mobilization, Cryotherapy, Moist heat, Manual therapy, and Re-evaluation  PLAN FOR NEXT SESSION: update HEP as appropriate, progressive postural/UE/cervical spine/functional strengthening, education, manual therapy and dry needling as needed.    Cira Rue, PT, DPT 08/06/2022, 3:23 PM  [facility] [address] [place], [place] [address] P: [phone] I F: [phone]

## 2022-08-06 ENCOUNTER — Encounter: Payer: Self-pay | Admitting: Physical Therapy

## 2022-08-06 ENCOUNTER — Ambulatory Visit: Payer: Commercial Managed Care - PPO | Attending: Family Medicine | Admitting: Physical Therapy

## 2022-08-06 DIAGNOSIS — R519 Headache, unspecified: Secondary | ICD-10-CM | POA: Insufficient documentation

## 2022-08-06 DIAGNOSIS — G8929 Other chronic pain: Secondary | ICD-10-CM | POA: Insufficient documentation

## 2022-08-06 DIAGNOSIS — M542 Cervicalgia: Secondary | ICD-10-CM | POA: Insufficient documentation

## 2022-08-06 DIAGNOSIS — M62838 Other muscle spasm: Secondary | ICD-10-CM | POA: Diagnosis present

## 2022-08-06 NOTE — Telephone Encounter (Signed)
Patient would like a referral to Ortho for possible neck injections.

## 2022-08-09 ENCOUNTER — Other Ambulatory Visit (INDEPENDENT_AMBULATORY_CARE_PROVIDER_SITE_OTHER): Payer: Commercial Managed Care - PPO

## 2022-08-09 DIAGNOSIS — E538 Deficiency of other specified B group vitamins: Secondary | ICD-10-CM

## 2022-08-09 DIAGNOSIS — G8929 Other chronic pain: Secondary | ICD-10-CM

## 2022-08-09 DIAGNOSIS — R7309 Other abnormal glucose: Secondary | ICD-10-CM | POA: Diagnosis not present

## 2022-08-09 DIAGNOSIS — Z114 Encounter for screening for human immunodeficiency virus [HIV]: Secondary | ICD-10-CM

## 2022-08-09 DIAGNOSIS — R7989 Other specified abnormal findings of blood chemistry: Secondary | ICD-10-CM

## 2022-08-09 DIAGNOSIS — M542 Cervicalgia: Secondary | ICD-10-CM

## 2022-08-09 DIAGNOSIS — E559 Vitamin D deficiency, unspecified: Secondary | ICD-10-CM

## 2022-08-09 DIAGNOSIS — Z1159 Encounter for screening for other viral diseases: Secondary | ICD-10-CM

## 2022-08-09 LAB — COMPREHENSIVE METABOLIC PANEL
ALT: 40 U/L — ABNORMAL HIGH (ref 0–35)
AST: 26 U/L (ref 0–37)
Albumin: 4.3 g/dL (ref 3.5–5.2)
Alkaline Phosphatase: 62 U/L (ref 39–117)
BUN: 19 mg/dL (ref 6–23)
CO2: 25 mEq/L (ref 19–32)
Calcium: 9.7 mg/dL (ref 8.4–10.5)
Chloride: 104 mEq/L (ref 96–112)
Creatinine, Ser: 0.89 mg/dL (ref 0.40–1.20)
GFR: 79.68 mL/min (ref >60.00)
Glucose, Bld: 91 mg/dL (ref 70–99)
Potassium: 4.4 mEq/L (ref 3.5–5.1)
Sodium: 138 mEq/L (ref 135–145)
Total Bilirubin: 0.4 mg/dL (ref 0.2–1.2)
Total Protein: 7.9 g/dL (ref 6.0–8.3)

## 2022-08-09 LAB — LIPID PANEL
Cholesterol: 220 mg/dL — ABNORMAL HIGH (ref 0–200)
HDL: 55.9 mg/dL (ref >39.00)
LDL Cholesterol: 124 mg/dL — ABNORMAL HIGH (ref 0–99)
NonHDL: 163.65
Total CHOL/HDL Ratio: 4
Triglycerides: 198 mg/dL — ABNORMAL HIGH (ref 0.0–149.0)
VLDL: 39.6 mg/dL (ref 0.0–40.0)

## 2022-08-09 LAB — CBC WITH DIFFERENTIAL/PLATELET
Basophils Absolute: 0.1 10*3/uL (ref 0.0–0.1)
Basophils Relative: 1.1 % (ref 0.0–3.0)
Eosinophils Absolute: 0 10*3/uL (ref 0.0–0.7)
Eosinophils Relative: 0.6 % (ref 0.0–5.0)
HCT: 39.2 % (ref 36.0–46.0)
Hemoglobin: 12.9 g/dL (ref 12.0–15.0)
Lymphocytes Relative: 28.1 % (ref 12.0–46.0)
Lymphs Abs: 2.3 10*3/uL (ref 0.7–4.0)
MCHC: 33 g/dL (ref 30.0–36.0)
MCV: 92.7 fl (ref 78.0–100.0)
Monocytes Absolute: 0.4 10*3/uL (ref 0.1–1.0)
Monocytes Relative: 5.3 % (ref 3.0–12.0)
Neutro Abs: 5.4 10*3/uL (ref 1.4–7.7)
Neutrophils Relative %: 64.9 % (ref 43.0–77.0)
Platelets: 327 10*3/uL (ref 150.0–400.0)
RBC: 4.22 Mil/uL (ref 3.87–5.11)
RDW: 12.8 % (ref 11.5–15.5)
WBC: 8.2 10*3/uL (ref 4.0–10.5)

## 2022-08-09 LAB — VITAMIN D 25 HYDROXY (VIT D DEFICIENCY, FRACTURES): VITD: 82.85 ng/mL (ref 30.00–100.00)

## 2022-08-09 LAB — VITAMIN B12: Vitamin B-12: 370 pg/mL (ref 211–911)

## 2022-08-09 LAB — HEMOGLOBIN A1C: Hgb A1c MFr Bld: 5.4 % (ref 4.6–6.5)

## 2022-08-10 LAB — HIV ANTIBODY (ROUTINE TESTING W REFLEX): HIV 1&2 Ab, 4th Generation: NONREACTIVE

## 2022-08-10 LAB — HEPATITIS C ANTIBODY: Hepatitis C Ab: NONREACTIVE

## 2022-08-16 ENCOUNTER — Other Ambulatory Visit: Payer: Self-pay | Admitting: Family Medicine

## 2022-08-16 DIAGNOSIS — R7401 Elevation of levels of liver transaminase levels: Secondary | ICD-10-CM

## 2022-08-19 ENCOUNTER — Telehealth: Payer: Self-pay | Admitting: Family Medicine

## 2022-08-20 ENCOUNTER — Ambulatory Visit: Payer: Commercial Managed Care - PPO | Attending: Family Medicine

## 2022-08-20 DIAGNOSIS — M542 Cervicalgia: Secondary | ICD-10-CM | POA: Insufficient documentation

## 2022-08-20 DIAGNOSIS — M62838 Other muscle spasm: Secondary | ICD-10-CM | POA: Diagnosis present

## 2022-08-20 DIAGNOSIS — R519 Headache, unspecified: Secondary | ICD-10-CM | POA: Diagnosis present

## 2022-08-20 NOTE — Therapy (Signed)
OUTPATIENT PHYSICAL THERAPY TREATMENT   Patient Name: Carolyn Johnston MRN: [id number] DOB:19-Aug-1980, 42 y.o., female Today's Date: 08/20/2022  END OF SESSION:  PT End of Session - 08/20/22 1825     Visit Number 2    Number of Visits 13    Date for PT Re-Evaluation 10/29/22    Authorization Type [employer] PPO reporting from 08/06/2022    Authorization Time Period max combined 60 PT/OT/SLP per year    Authorization - Number of Visits 2    Progress Note Due on Visit 60    PT Start Time 1740    PT Stop Time 1815    PT Time Calculation (min) 35 min    Activity Tolerance Patient tolerated treatment well;No increased pain    Behavior During Therapy WFL for tasks assessed/performed             Past Medical History:  Diagnosis Date   Allergy    trees, dust mites   COVID-19    x2   Migraines    Past Surgical History:  Procedure Laterality Date   adnoids suture     DILATION AND CURETTAGE OF UTERUS     TONSILLECTOMY     WISDOM TOOTH EXTRACTION     Patient Active Problem List   Diagnosis Date Noted   Neck pain, chronic 07/27/2022   B12 deficiency 07/27/2022   Encounter for screening for HIV 07/27/2022   Need for hepatitis C screening test 07/27/2022   Abnormal glucose 07/27/2022   High serum cortisol 06/28/2021   Brittle nails 06/26/2021   Hair loss 03/24/2021   Birth control counseling 03/24/2021   Herpes 03/24/2021   Vitamin D insufficiency 06/06/2016   Recurrent occipital headache 04/22/2016   Bilateral occipital neuralgia 04/22/2016   Cervico-occipital neuralgia of right side 04/22/2016   Cervico-occipital neuralgia of left side 04/22/2016   Chronic pain syndrome 04/22/2016   Vestibular migraine 06/01/2013    PCP: Dana Euel Castile, MD  REFERRING PROVIDER: Dana Kasch Borquez, MD  REFERRING DIAG: neck pain, chronic  THERAPY DIAG:  Cervicalgia  Nonintractable headache, unspecified chronicity pattern, unspecified headache type  Other muscle spasm  Rationale for  Evaluation and Treatment: Rehabilitation  ONSET DATE: 10 years prior to PT evaluation.   SUBJECTIVE:                                                                                                                                                                                                         SUBJECTIVE STATEMENT: Pt reports successful performance of HEP without symptoms provocation. She worked today, but her  pain is pretty good, 3/10, sepite having a lot of cooking activity at home after work.   PERTINENT HISTORY:  Patient is a 42 y.o. female who presents to outpatient physical therapy with a referral for medical diagnosis chronic neck pain. This patient's chief complaints consist of chronic neck pain and incompletely controlled migraine headaches leading to the following functional deficits: difficulty sleeping, moving head, working, driving, leisure activities. Relevant past medical history and comorbidities include Vestibular migraine; Recurrent occipital headache; Bilateral occipital neuralgia; Cervico-occipital neuralgia of right side; Cervico-occipital neuralgia of left side; Chronic pain syndrome; Vitamin D insufficiency; Hair loss; Herpes; Brittle nails; High serum cortisol; Neck pain, chronic; B12 deficiency; and Abnormal glucose, former smoker.  Patient denies hx of cancer, stroke, seizures, lung problems, heart problems, diabetes, unexplained weight loss, unexplained changes in bowel or bladder problems, unexplained stumbling or dropping things, osteoporosis, and spinal surgery    PAIN:  Are you having pain? Yes: NPRS scale: 3/10 lower neck bilat C6/7 facet area distribution  FUNCTIONAL LIMITATIONS: difficulty sleeping, moving head, working, driving, leisure activities.   LEISURE: camping, fishing, dancing, being outside in the sun, walking  PRECAUTIONS: None  WEIGHT BEARING RESTRICTIONS: No  FALLS:  Has patient fallen in last 6 months? No Not concerned about  falling  LIVING ENVIRONMENT: Lives with: alone with 50/50 custody with 18 and 22 year old children.  Lives in: single story home Stairs: 3 steps in the front with bilateral handrails (unsure if can reach both), 4 steps in the back with no handrail.  Does not need assistive devices.   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   PLOF: Independent, someone at work lifts the very heavy parts for her.   PATIENT GOALS: "to see how it can improve"   OBJECTIVE  DIAGNOSTIC FINDINGS:  Cervical spine xray report from 07/26/2022:  CLINICAL DATA:  worsening cervical neck pain   EXAM: CERVICAL SPINE - COMPLETE 4+ VIEW   COMPARISON:  April 22, 2016   FINDINGS: The cervical spine is visualized from C1-C7. Cervical alignment is maintained. Vertebral body heights are maintained: no evidence of acute fracture. Mild intervertebral disc space height loss at C6-7. No high-grade osseous neuroforaminal narrowing. Mild bilateral osseous neuroforaminal narrowing at C6-7 secondary to uncovertebral hypertrophy. No prevertebral soft tissue swelling. Visualized thorax is unremarkable.   IMPRESSION: Mild degenerative changes at C6-7.   Electronically Signed   By: Meda Klinefelter M.D.   On: 07/26/2022 17:13  SELF- REPORTED FUNCTION FOTO score: 57/100 (neck questionnaire)  OBSERVATION/INSPECTION Posture Posture (seated): WFL Anthropometrics Tremor: none Body composition: BMI: 23.2  Muscle bulk: WNL Skin: WNL where visualized Edema: none Functional Mobility Bed mobility: supine <> sit and rolling WNL Transfers: sit <> stand WNL Gait: grossly WNL for household and short community ambulation. More detailed gait analysis deferred to later date as needed.   SPINE MOTION CERVICAL SPINE AROM *Indicates pain Flexion: 65 increased pain at base of neck and increased tension over B UE Extension: 50  increased pain at base of neck Side Flexion:   R 40 contralateral tension  L 47 contralateral tension Rotation:  R 60  L 65 ipsilateral pain, and contralateral pain at upper cervical spine -Supine Cervical rotation P/ROM: >90 degrees bilat, uncomfortable, but not painful (08/20/22) Protraction: ROM WFL "I don't like it" "feels shortened" Retraction: 1.25 inches, increased pain right thoracic spine near T7.   Thoracic SPINE AROM (08/20/22) -difficulty isolating thoracic movements without mostly  cervical movements -resting posture T2-T7, fairly extended with only slight kyphosis -Active thoracic extension: hypomobile, minimal change to curvature  -Active thoracic flexion: hypomobile, mild change to curvature   PERIPHERAL JOINT MOTION (in degrees) ACTIVE RANGE OF MOTION (AROM) 08/06/2022: B UE grossly WNL.   MUSCLE PERFORMANCE (MMT):  Not formally tested. No strength complaint and WFL for activities assessed today.   SPECIAL TESTS: CERVICAL SPINE Cervical spine axial compression: increased pain at right neck Spurling's part B:  R = increased pain at right neck, L = increased pain at back of neck Cervical spine axial distraction: no increased pain  ACCESSORY MOTION: Tender to CPA throughout entire cervical spine and upper thoracic spine.   PALPATION: TTP at upper cervical spine, right side paraspinals, B UT/LS (R > L).    TODAY'S TREATMENT:  -A/ROM assessment (qualitative) cervical rotation: able to see suboccipital restriction creating end-range (rotation) deviations into capital extension, Rt > Lt   -Supine Cervical rotation P/ROM: >90 degrees bilat, uncomfortable, but not painful  -deep neck flexor endurance: excellent performance of cued form. No pain provocation. Pt cut off at 31sec without any "shaking" of fatigued muscles on her part. Result is WNL for age matched norms. -Thoracic SPINE AROM (08/20/22) -difficulty isolating thoracic movements without mostly cervical  movements -resting posture T2-T7, fairly extended with only slight kyphosis -Active thoracic extension: hypomobile, minimal change to curvature  -Active thoracic flexion: hypomobile, mild change to curvature  -sustained release to bilat suboccipitals x4 minutes, palpable reduction in tension after 2 minutes, subjective reduction in tenderness/tightness -sitting suboccipital stretch 1x45 sec bilat (end range capital/cervical flexion + rotation to end range combination, then stretch applied to head toward sternum) *added to HEP  -seated C0/C1 mobilization (capital flexion) end-range rotation nodding x15 bilat (more painful on in left rotation, but also struggles toisolate capital flexion v cervical flexion)   -supine T6 towel roll sustained thoracic extension stretch   -demo active scapular depression/retraction x30sec   -demo passive T stretch "airplane" x 30sec   -demo passive goal post stretch x15 sec (significant shoulder ER limitations bilat ~45 degrees)  *added to HEP  -post stretch wand flexion x15 (arms only, no wand)  *added to HEP  - Education on HEP including handout        Pt required multimodal cuing for proper technique and to facilitate improved neuromuscular control, strength, range of motion, and functional ability resulting in improved performance and form.  PATIENT EDUCATION:  Education details: Exercise purpose/form. Self management techniques. Education on diagnosis, prognosis, POC, anatomy and physiology of current condition. Education on HEP including handout  Person educated: Patient Education method: Explanation, Demonstration, Tactile cues, Verbal cues, and Handouts Education comprehension: verbalized understanding, returned demonstration, and needs further education  HOME EXERCISE PROGRAM: Access Code: [email or web address] URL: [email or web address] Date: 08/06/2022 Prepared by: Norton Blizzard  Exercises - Supine Head Nod Deep Neck Flexor Training  - 1-2  x daily - 2 sets - 10 reps - 5 seconds hold  ASSESSMENT:  CLINICAL IMPRESSION: Finished additional tests and measures from evaluation. Pt did well on DNF endurance assessment, WNL results. Reportedly good tolerance to HEP previously issued. Author provides targetted release work to suboccipital region followed by gentle lengthening, and joint mobilization. These are issued for home performance. Pt also educated on importance of regional interdependence, demonstrated how her upper thoracic hypomobility places additional mobility load on cervical spine- consider implications to her prferencial stomach sleeping wherein thoracic extension is limited and the  result is a greater degree of  cervical extension. No pain exacerbation in session as Thereasa Parkin was initially concerned. Patient will benefit from skilled physical therapy intervention to address current body structure impairments and activity limitations to improve function and work towards goals set in current POC in order to return to prior level of function or maximal functional improvement.     OBJECTIVE IMPAIRMENTS: decreased activity tolerance, decreased coordination, decreased endurance, decreased knowledge of condition, decreased ROM, decreased strength, impaired perceived functional ability, increased muscle spasms, impaired flexibility, and pain.   ACTIVITY LIMITATIONS: carrying, lifting, bending, and sleeping  PARTICIPATION LIMITATIONS: occupation and   difficulty sleeping, moving head, working, driving, leisure activities  PERSONAL FACTORS: Fitness, Past/current experiences, Time since onset of injury/illness/exacerbation, and 3+ comorbidities:   Vestibular migraine; Recurrent occipital headache; Bilateral occipital neuralgia; Cervico-occipital neuralgia of right side; Cervico-occipital neuralgia of left side; Chronic pain syndrome; Vitamin D insufficiency; Hair loss; Herpes; Brittle nails; High serum cortisol; Neck pain, chronic; B12  deficiency; and Abnormal glucose, former smoker are also affecting patient's functional outcome.   REHAB POTENTIAL: Good  CLINICAL DECISION MAKING: Stable/uncomplicated  EVALUATION COMPLEXITY: Low   GOALS: Goals reviewed with patient? No  SHORT TERM GOALS: Target date: 08/20/2022  Patient will be independent with initial home exercise program for self-management of symptoms. Baseline: Initial HEP provided at IE (08/06/22); Goal status: INITIAL   LONG TERM GOALS: Target date: 10/29/2022  Patient will be independent with a long-term home exercise program for self-management of symptoms.  Baseline: Initial HEP provided at IE (08/06/22); Goal status: INITIAL  2.  Patient will demonstrate improved FOTO to equal or greater than 67 by visit #10 to demonstrate improvement in overall condition and self-reported functional ability.  Baseline: 57 (08/06/22); Goal status: INITIAL  3.  Patient will report neck pain with functional activity at equal or less than 3/10 to improve her ability to sleep, complete work activities, and move her head with less difficulty.  Baseline: up to 9/10 (08/06/22); Goal status: INITIAL  4.  Patient will demonstrate increase in cervical spine ROM by 5 degrees in rotation, flexion, and extension with no increase in pain except intermittent end range discomfort to improve her ability to move her head while completing daily activities.  Baseline: see objective  (08/06/22); Goal status: INITIAL  5.  Patient will complete community, work and/or recreational activities with 50% less  limitation due to current condition.  Baseline: difficulty sleeping, moving head, working, driving, leisure activities (08/06/22); Goal status: INITIAL   PLAN:  PT FREQUENCY: 1-2x/week  PT DURATION: 12 weeks  PLANNED INTERVENTIONS: Therapeutic exercises, Therapeutic activity, Neuromuscular re-education, Patient/Family education, Self Care, Joint mobilization, Aquatic Therapy, Dry  Needling, Electrical stimulation, Spinal mobilization, Cryotherapy, Moist heat, Manual therapy, and Re-evaluation  PLAN FOR NEXT SESSION: update HEP as appropriate, progressive postural/UE/cervical spine/functional strengthening, education, manual therapy and dry needling as needed.    Rosamaria Lints, PT, DPT 08/20/2022, 6:28 PM  [facility] [address] [place], [place] [address] P: [phone] I F: [phone]

## 2022-08-26 ENCOUNTER — Ambulatory Visit: Payer: Commercial Managed Care - PPO | Admitting: Physical Therapy

## 2022-08-26 ENCOUNTER — Encounter: Payer: Self-pay | Admitting: Physical Therapy

## 2022-08-26 DIAGNOSIS — M542 Cervicalgia: Secondary | ICD-10-CM | POA: Diagnosis not present

## 2022-08-26 DIAGNOSIS — M62838 Other muscle spasm: Secondary | ICD-10-CM

## 2022-08-26 DIAGNOSIS — R519 Headache, unspecified: Secondary | ICD-10-CM

## 2022-08-26 NOTE — Therapy (Signed)
OUTPATIENT PHYSICAL THERAPY TREATMENT   Patient Name: Carolyn Johnston MRN: [id number] DOB:23-Jan-1981, 42 y.o., female Today's Date: 08/26/2022  END OF SESSION:  PT End of Session - 08/26/22 1950     Visit Number 3    Number of Visits 13    Date for PT Re-Evaluation 10/29/22    Authorization Type [employer] PPO reporting from 08/06/2022    Authorization Time Period max combined 60 PT/OT/SLP per year    Authorization - Number of Visits 3    Progress Note Due on Visit 60    PT Start Time 1857    PT Stop Time 1947    PT Time Calculation (min) 50 min    Activity Tolerance Patient tolerated treatment well;No increased pain    Behavior During Therapy WFL for tasks assessed/performed              Past Medical History:  Diagnosis Date   Allergy    trees, dust mites   COVID-19    x2   Migraines    Past Surgical History:  Procedure Laterality Date   adnoids suture     DILATION AND CURETTAGE OF UTERUS     TONSILLECTOMY     WISDOM TOOTH EXTRACTION     Patient Active Problem List   Diagnosis Date Noted   Neck pain, chronic 07/27/2022   B12 deficiency 07/27/2022   Encounter for screening for HIV 07/27/2022   Need for hepatitis C screening test 07/27/2022   Abnormal glucose 07/27/2022   High serum cortisol 06/28/2021   Brittle nails 06/26/2021   Hair loss 03/24/2021   Birth control counseling 03/24/2021   Herpes 03/24/2021   Vitamin D insufficiency 06/06/2016   Recurrent occipital headache 04/22/2016   Bilateral occipital neuralgia 04/22/2016   Cervico-occipital neuralgia of right side 04/22/2016   Cervico-occipital neuralgia of left side 04/22/2016   Chronic pain syndrome 04/22/2016   Vestibular migraine 06/01/2013    PCP: Dana Allan, MD  REFERRING PROVIDER: Dana Allan, MD  REFERRING DIAG: neck pain, chronic  THERAPY DIAG:  Cervicalgia  Nonintractable headache, unspecified chronicity pattern, unspecified headache type  Other muscle spasm  Rationale for  Evaluation and Treatment: Rehabilitation  ONSET DATE: 10 years prior to PT evaluation.   PERTINENT HISTORY:  Patient is a 42 y.o. female who presents to outpatient physical therapy with a referral for medical diagnosis chronic neck pain. This patient's chief complaints consist of chronic neck pain and incompletely controlled migraine headaches leading to the following functional deficits: difficulty sleeping, moving head, working, driving, leisure activities. Relevant past medical history and comorbidities include Vestibular migraine; Recurrent occipital headache; Bilateral occipital neuralgia; Cervico-occipital neuralgia of right side; Cervico-occipital neuralgia of left side; Chronic pain syndrome; Vitamin D insufficiency; Hair loss; Herpes; Brittle nails; High serum cortisol; Neck pain, chronic; B12 deficiency; and Abnormal glucose, former smoker.  Patient denies hx of cancer, stroke, seizures, lung problems, heart problems, diabetes, unexplained weight loss, unexplained changes in bowel or bladder problems, unexplained stumbling or dropping things, osteoporosis, and spinal surgery  SUBJECTIVE:  SUBJECTIVE STATEMENT: Patient reports she is a little uncomfortable in her neck, especially over the left upper trap. She  got her first migraine infusion today and it went well. She states she felt better after last PT session due to the manual therapy and felt the towel release was really good for her. She did not get a migraine yesterday because she was travelling and did not get back to town until the rain was almost gone. She is surprised her neck pain is not worse since she was camping this weekend and was driving for 4 hours yesterday.   PAIN:  Are you having pain?  NPRS scale: 3/10 lower neck L > R upper  trap.    PRECAUTIONS: None  PATIENT GOALS: "to see how it can improve"   OBJECTIVE   TODAY'S TREATMENT:  Therapeutic exercise: to centralize symptoms and improve ROM, strength, muscular endurance, and activity tolerance required for successful completion of functional activities.  - Sidelying open book (thoracic rotation) to improve thoracic, shoulder girdle, and upper trunk mobility. 1x10 each side. (Tighter with left rotation) (Manual therapy - see below) -sitting suboccipital stretch 2x30 sec bilat (end range capital/cervical flexion + rotation to end range combination, then stretch applied to head toward sternum) *added to HEP -seated C0/C1 mobilization (capital flexion) end-range rotation nodding 2x20 bilat (more painful on in left rotation)  - seated thoracic extension with hands clasped behind neck and elbows under chin, towel roll at approx C6, L foot on foot stool, 1x10 - Education on HEP including handout   Manual therapy: to reduce pain and tissue tension, improve range of motion, neuromodulation, in order to promote improved ability to complete functional activities. SUPINE - suboccipital release 4 min, STM to bilateral upper trap/levator scap (left more painful and tighter than right), cervical paraspinals (R more painful/tighter than left).   Pt required multimodal cuing for proper technique and to facilitate improved neuromuscular control, strength, range of motion, and functional ability resulting in improved performance and form.  PATIENT EDUCATION:  Education details: Exercise purpose/form. Self management techniques.  Person educated: Patient Education method: Explanation, Demonstration, Tactile cues, Verbal cues, and Handouts Education comprehension: verbalized understanding, returned demonstration, and needs further education  HOME EXERCISE PROGRAM: Access Code: [email or web address] URL: [email or web address] Date: 08/06/2022 Prepared by: Norton Blizzard  Exercises - Supine Head Nod Deep Neck Flexor Training  - 1-2 x daily - 2 sets - 10 reps - 5 seconds hold  HOME EXERCISE PROGRAM [[email or web address]] View at [email or web address]" using code: [email or web address]  Thoracic Extension  -  Repeat 10 Repetitions, Hold 3 Seconds, Complete 1 Set, Perform 1 Times a Day      ASSESSMENT:  CLINICAL IMPRESSION: Patient arrives reporting improvement after last PT session and with HEP. Today's session continued focus on improvement of upper cervical spine mobility, muscle tension, and thoracic mobility. Patient reported feeling better and with improved ease of movement by end of session. She was provided handouts for office work ergonomics and educated in improved posture using a lumbar roll. Patient would benefit from continued management of limiting condition by skilled physical therapist to address remaining impairments and functional limitations to work towards stated goals and return to PLOF or maximal functional independence.    OBJECTIVE IMPAIRMENTS: decreased activity tolerance, decreased coordination, decreased endurance, decreased knowledge of condition, decreased ROM, decreased strength, impaired perceived functional ability, increased muscle spasms, impaired flexibility, and pain.   ACTIVITY LIMITATIONS: carrying, lifting, bending, and sleeping  PARTICIPATION LIMITATIONS: occupation and   difficulty sleeping, moving  head, working, driving, leisure activities  PERSONAL FACTORS: Fitness, Past/current experiences, Time since onset of injury/illness/exacerbation, and 3+ comorbidities:   Vestibular migraine; Recurrent occipital headache; Bilateral occipital neuralgia; Cervico-occipital neuralgia of right side; Cervico-occipital neuralgia of left side; Chronic pain syndrome; Vitamin D insufficiency; Hair loss; Herpes; Brittle nails; High serum cortisol; Neck pain, chronic; B12 deficiency; and Abnormal glucose, former smoker are also affecting patient's functional  outcome.   REHAB POTENTIAL: Good  CLINICAL DECISION MAKING: Stable/uncomplicated  EVALUATION COMPLEXITY: Low   GOALS: Goals reviewed with patient? No  SHORT TERM GOALS: Target date: 08/20/2022  Patient will be independent with initial home exercise program for self-management of symptoms. Baseline: Initial HEP provided at IE (08/06/22); Goal status: MET   LONG TERM GOALS: Target date: 10/29/2022  Patient will be independent with a long-term home exercise program for self-management of symptoms.  Baseline: Initial HEP provided at IE (08/06/22); Goal status: In-progress  2.  Patient will demonstrate improved FOTO to equal or greater than 67 by visit #10 to demonstrate improvement in overall condition and self-reported functional ability.  Baseline: 57 (08/06/22); Goal status: In-progress  3.  Patient will report neck pain with functional activity at equal or less than 3/10 to improve her ability to sleep, complete work activities, and move her head with less difficulty.  Baseline: up to 9/10 (08/06/22); Goal status: In-progress  4.  Patient will demonstrate increase in cervical spine ROM by 5 degrees in rotation, flexion, and extension with no increase in pain except intermittent end range discomfort to improve her ability to move her head while completing daily activities.  Baseline: see objective  (08/06/22); Goal status: In-progress  5.  Patient will complete community, work and/or recreational activities with 50% less  limitation due to current condition.  Baseline: difficulty sleeping, moving head, working, driving, leisure activities (08/06/22); Goal status: In-progress   PLAN:  PT FREQUENCY: 1-2x/week  PT DURATION: 12 weeks  PLANNED INTERVENTIONS: Therapeutic exercises, Therapeutic activity, Neuromuscular re-education, Patient/Family education, Self Care, Joint mobilization, Aquatic Therapy, Dry Needling, Electrical stimulation, Spinal mobilization, Cryotherapy,  Moist heat, Manual therapy, and Re-evaluation  PLAN FOR NEXT SESSION: update HEP as appropriate, progressive postural/UE/cervical spine/functional strengthening, education, manual therapy and dry needling as needed.    Cira Rue, PT, DPT 08/26/2022, 7:51 PM  [facility] [address] [place], [place] [address] P: [phone] I F: [phone]

## 2022-08-28 ENCOUNTER — Encounter: Payer: Self-pay | Admitting: Physical Therapy

## 2022-08-28 ENCOUNTER — Ambulatory Visit: Payer: Commercial Managed Care - PPO | Admitting: Physical Therapy

## 2022-08-28 DIAGNOSIS — R519 Headache, unspecified: Secondary | ICD-10-CM

## 2022-08-28 DIAGNOSIS — M542 Cervicalgia: Secondary | ICD-10-CM | POA: Diagnosis not present

## 2022-08-28 DIAGNOSIS — M62838 Other muscle spasm: Secondary | ICD-10-CM

## 2022-08-28 NOTE — Therapy (Signed)
OUTPATIENT PHYSICAL THERAPY TREATMENT   Patient Name: Carolyn Johnston MRN: [id number] DOB:1980-08-02, 42 y.o., female Today's Date: 08/28/2022  END OF SESSION:  PT End of Session - 08/28/22 1901     Visit Number 4    Number of Visits 13    Date for PT Re-Evaluation 10/29/22    Authorization Type [employer] PPO reporting from 08/06/2022    Authorization Time Period max combined 60 PT/OT/SLP per year    Authorization - Number of Visits 3    Progress Note Due on Visit 60    PT Start Time 1857    PT Stop Time 1937    PT Time Calculation (min) 40 min    Activity Tolerance Patient tolerated treatment well;No increased pain    Behavior During Therapy WFL for tasks assessed/performed              Past Medical History:  Diagnosis Date   Allergy    trees, dust mites   COVID-19    x2   Migraines    Past Surgical History:  Procedure Laterality Date   adnoids suture     DILATION AND CURETTAGE OF UTERUS     TONSILLECTOMY     WISDOM TOOTH EXTRACTION     Patient Active Problem List   Diagnosis Date Noted   Neck pain, chronic 07/27/2022   B12 deficiency 07/27/2022   Encounter for screening for HIV 07/27/2022   Need for hepatitis C screening test 07/27/2022   Abnormal glucose 07/27/2022   High serum cortisol 06/28/2021   Brittle nails 06/26/2021   Hair loss 03/24/2021   Birth control counseling 03/24/2021   Herpes 03/24/2021   Vitamin D insufficiency 06/06/2016   Recurrent occipital headache 04/22/2016   Bilateral occipital neuralgia 04/22/2016   Cervico-occipital neuralgia of right side 04/22/2016   Cervico-occipital neuralgia of left side 04/22/2016   Chronic pain syndrome 04/22/2016   Vestibular migraine 06/01/2013    PCP: Carolyn Allan, MD  REFERRING PROVIDER: Dana Allan, MD  REFERRING DIAG: neck pain, chronic  THERAPY DIAG:  Cervicalgia  Nonintractable headache, unspecified chronicity pattern, unspecified headache type  Other muscle spasm  Rationale for  Evaluation and Treatment: Rehabilitation  ONSET DATE: 10 years prior to PT evaluation.   PERTINENT HISTORY:  Patient is a 42 y.o. female who presents to outpatient physical therapy with a referral for medical diagnosis chronic neck pain. This patient's chief complaints consist of chronic neck pain and incompletely controlled migraine headaches leading to the following functional deficits: difficulty sleeping, moving head, working, driving, leisure activities. Relevant past medical history and comorbidities include Vestibular migraine; Recurrent occipital headache; Bilateral occipital neuralgia; Cervico-occipital neuralgia of right side; Cervico-occipital neuralgia of left side; Chronic pain syndrome; Vitamin D insufficiency; Hair loss; Herpes; Brittle nails; High serum cortisol; Neck pain, chronic; B12 deficiency; and Abnormal glucose, former smoker.  Patient denies hx of cancer, stroke, seizures, lung problems, heart problems, diabetes, unexplained weight loss, unexplained changes in bowel or bladder problems, unexplained stumbling or dropping things, osteoporosis, and spinal surgery  SUBJECTIVE:  SUBJECTIVE STATEMENT: Patient reports she was really sore yesterday over her suboccipital region and bilateral upper traps. She is feeling better today but her left side is a lot more tender than the right and still hurting in the suboccipital region. She has a migraine today that she feels at her right temporal region. She states she was too sore to do any of her HEP. Her AC is not working right, which is irritating her.   PAIN:  Are you having pain?  NPRS scale: 6/10 right headache,  3/10 left suboccipital region, 2/10 left upper trap.    PRECAUTIONS: None  PATIENT GOALS: "to see how it can  improve"   OBJECTIVE  TODAY'S TREATMENT:  Therapeutic exercise: to centralize symptoms and improve ROM, strength, muscular endurance, and activity tolerance required for successful completion of functional activities.  - Sidelying open book (thoracic rotation) to improve thoracic, shoulder girdle, and upper trunk mobility. 1x10 each side. (Tighter with left rotation) (Manual therapy - see below) -sitting suboccipital stretch 2x30 sec bilat (end range capital/cervical flexion + rotation to end range combination, then stretch applied to head toward sternum) -seated C0/C1 mobilization (capital flexion) end-range rotation nodding 2x20 bilat (more painful on in left rotation)  -supine T6 towel roll sustained thoracic extension stretch:     ~ 1 min in "T" position   - B horizontal abduction with scapular retraction against BlueTB, 2x10  - standing bilateral pec stretch in doorway with shoulders at 90 degrees abduction.  - standing wall angel, 2x10 - seated lat pull with with stepwise scapular motion, 2x10 at 20#.   Manual therapy: to reduce pain and tissue tension, improve range of motion, neuromodulation, in order to promote improved ability to complete functional activities. SUPINE -STM to posterior cervical spine focusing on gentle manipulation of the soft tissue of the suboccipitals and UT/LS.   Pt required multimodal cuing for proper technique and to facilitate improved neuromuscular control, strength, range of motion, and functional ability resulting in improved performance and form.  PATIENT EDUCATION:  Education details: Exercise purpose/form. Self management techniques.  Person educated: Patient Education method: Explanation, Demonstration, Tactile cues, Verbal cues, and Handouts Education comprehension: verbalized understanding, returned demonstration, and needs further education  HOME EXERCISE PROGRAM: Access Code: [email or web address] URL: [email or web address] Date:  08/06/2022 Prepared by: Norton Blizzard  Exercises - Supine Head Nod Deep Neck Flexor Training  - 1-2 x daily - 2 sets - 10 reps - 5 seconds hold  HOME EXERCISE PROGRAM [[id number]] View at "[email or web address]" using code: [email or web address]  Thoracic Extension  -  Repeat 10 Repetitions, Hold 3 Seconds, Complete 1 Set, Perform 1 Times a Day      ASSESSMENT:  CLINICAL IMPRESSION: Patient arrives reporting significant pain and soreness after last PT session. Continued with similar exercises and interventions today but with more gentle manual therapy. Patient tolerated session well without increase in pain. She would benefit from trial of dry needling next session to help decrease pain at suboccipital region and/or upper trap region. Patient would most likely benefit from continued management of limiting condition by skilled physical therapist to address remaining impairments and functional limitations to work towards stated goals and return to PLOF or maximal functional independence.   OBJECTIVE IMPAIRMENTS: decreased activity tolerance, decreased coordination, decreased endurance, decreased knowledge of condition, decreased ROM, decreased strength, impaired perceived functional ability, increased muscle spasms, impaired flexibility, and pain.   ACTIVITY LIMITATIONS: carrying, lifting, bending, and sleeping  PARTICIPATION LIMITATIONS: occupation and   difficulty sleeping, moving head, working, driving,  leisure activities  PERSONAL FACTORS: Fitness, Past/current experiences, Time since onset of injury/illness/exacerbation, and 3+ comorbidities:   Vestibular migraine; Recurrent occipital headache; Bilateral occipital neuralgia; Cervico-occipital neuralgia of right side; Cervico-occipital neuralgia of left side; Chronic pain syndrome; Vitamin D insufficiency; Hair loss; Herpes; Brittle nails; High serum cortisol; Neck pain, chronic; B12 deficiency; and Abnormal glucose, former smoker are also affecting  patient's functional outcome.   REHAB POTENTIAL: Good  CLINICAL DECISION MAKING: Stable/uncomplicated  EVALUATION COMPLEXITY: Low   GOALS: Goals reviewed with patient? No  SHORT TERM GOALS: Target date: 08/20/2022  Patient will be independent with initial home exercise program for self-management of symptoms. Baseline: Initial HEP provided at IE (08/06/22); Goal status: MET   LONG TERM GOALS: Target date: 10/29/2022  Patient will be independent with a long-term home exercise program for self-management of symptoms.  Baseline: Initial HEP provided at IE (08/06/22); Goal status: In-progress  2.  Patient will demonstrate improved FOTO to equal or greater than 67 by visit #10 to demonstrate improvement in overall condition and self-reported functional ability.  Baseline: 57 (08/06/22); Goal status: In-progress  3.  Patient will report neck pain with functional activity at equal or less than 3/10 to improve her ability to sleep, complete work activities, and move her head with less difficulty.  Baseline: up to 9/10 (08/06/22); Goal status: In-progress  4.  Patient will demonstrate increase in cervical spine ROM by 5 degrees in rotation, flexion, and extension with no increase in pain except intermittent end range discomfort to improve her ability to move her head while completing daily activities.  Baseline: see objective  (08/06/22); Goal status: In-progress  5.  Patient will complete community, work and/or recreational activities with 50% less  limitation due to current condition.  Baseline: difficulty sleeping, moving head, working, driving, leisure activities (08/06/22); Goal status: In-progress   PLAN:  PT FREQUENCY: 1-2x/week  PT DURATION: 12 weeks  PLANNED INTERVENTIONS: Therapeutic exercises, Therapeutic activity, Neuromuscular re-education, Patient/Family education, Self Care, Joint mobilization, Aquatic Therapy, Dry Needling, Electrical stimulation, Spinal  mobilization, Cryotherapy, Moist heat, Manual therapy, and Re-evaluation  PLAN FOR NEXT SESSION: update HEP as appropriate, progressive postural/UE/cervical spine/functional strengthening, education, manual therapy and dry needling as needed.    Cira Rue, PT, DPT 08/28/2022, 7:47 PM  [facility] [address] [place], [place] [address] P: [phone] I F: [phone]

## 2022-09-02 ENCOUNTER — Encounter: Payer: Commercial Managed Care - PPO | Admitting: Physical Therapy

## 2022-09-03 ENCOUNTER — Encounter: Payer: Self-pay | Admitting: Physical Therapy

## 2022-09-03 ENCOUNTER — Ambulatory Visit: Payer: Commercial Managed Care - PPO | Admitting: Physical Therapy

## 2022-09-03 DIAGNOSIS — M542 Cervicalgia: Secondary | ICD-10-CM | POA: Diagnosis not present

## 2022-09-03 DIAGNOSIS — M62838 Other muscle spasm: Secondary | ICD-10-CM

## 2022-09-03 DIAGNOSIS — R519 Headache, unspecified: Secondary | ICD-10-CM

## 2022-09-03 NOTE — Therapy (Signed)
OUTPATIENT PHYSICAL THERAPY TREATMENT   Patient Name: Carolyn Johnston MRN: [id number] DOB:Jun 25, 1980, 42 y.o., female Today's Date: 09/03/2022  END OF SESSION:  PT End of Session - 09/03/22 1836     Visit Number 5    Number of Visits 13    Date for PT Re-Evaluation 10/29/22    Authorization Type [employer] PPO reporting from 08/06/2022    Authorization Time Period max combined 60 PT/OT/SLP per year    Authorization - Number of Visits 4    Progress Note Due on Visit 60    PT Start Time 1733    PT Stop Time 1815    PT Time Calculation (min) 42 min    Activity Tolerance Patient tolerated treatment well    Behavior During Therapy WFL for tasks assessed/performed               Past Medical History:  Diagnosis Date   Allergy    trees, dust mites   COVID-19    x2   Migraines    Past Surgical History:  Procedure Laterality Date   adnoids suture     DILATION AND CURETTAGE OF UTERUS     TONSILLECTOMY     WISDOM TOOTH EXTRACTION     Patient Active Problem List   Diagnosis Date Noted   Neck pain, chronic 07/27/2022   B12 deficiency 07/27/2022   Encounter for screening for HIV 07/27/2022   Need for hepatitis C screening test 07/27/2022   Abnormal glucose 07/27/2022   High serum cortisol 06/28/2021   Brittle nails 06/26/2021   Hair loss 03/24/2021   Birth control counseling 03/24/2021   Herpes 03/24/2021   Vitamin D insufficiency 06/06/2016   Recurrent occipital headache 04/22/2016   Bilateral occipital neuralgia 04/22/2016   Cervico-occipital neuralgia of right side 04/22/2016   Cervico-occipital neuralgia of left side 04/22/2016   Chronic pain syndrome 04/22/2016   Vestibular migraine 06/01/2013    PCP: Dana Allan, MD  REFERRING PROVIDER: Dana Allan, MD  REFERRING DIAG: neck pain, chronic  THERAPY DIAG:  Cervicalgia  Nonintractable headache, unspecified chronicity pattern, unspecified headache type  Other muscle spasm  Rationale for Evaluation and  Treatment: Rehabilitation  ONSET DATE: 10 years prior to PT evaluation.   PERTINENT HISTORY:  Patient is a 42 y.o. female who presents to outpatient physical therapy with a referral for medical diagnosis chronic neck pain. This patient's chief complaints consist of chronic neck pain and incompletely controlled migraine headaches leading to the following functional deficits: difficulty sleeping, moving head, working, driving, leisure activities. Relevant past medical history and comorbidities include Vestibular migraine; Recurrent occipital headache; Bilateral occipital neuralgia; Cervico-occipital neuralgia of right side; Cervico-occipital neuralgia of left side; Chronic pain syndrome; Vitamin D insufficiency; Hair loss; Herpes; Brittle nails; High serum cortisol; Neck pain, chronic; B12 deficiency; and Abnormal glucose, former smoker.  Patient denies hx of cancer, stroke, seizures, lung problems, heart problems, diabetes, unexplained weight loss, unexplained changes in bowel or bladder problems, unexplained stumbling or dropping things, osteoporosis, and spinal surgery  SUBJECTIVE:  SUBJECTIVE STATEMENT: Patient reports she feels like she is getting better and she felt good after last PT session.   PAIN:  Are you having pain?  NPRS scale: 2/10 base of neck and over bilateral UT.   PRECAUTIONS: None  PATIENT GOALS: "to see how it can improve"   OBJECTIVE  SELF-REPORTED FUNCTION FOTO score: 68/100 (neck questionnaire)  TODAY'S TREATMENT:  Therapeutic exercise: to centralize symptoms and improve ROM, strength, muscular endurance, and activity tolerance required for successful completion of functional activities.  - Upper body ergometer at level 5 to  encourage joint nutrition, warm tissue,  induce analgesic effect of aerobic exercise, improve muscular strength and endurance,  and prepare for remainder of session. 5 minutes.  - Sidelying open book (thoracic rotation) to improve thoracic, shoulder girdle, and upper trunk mobility. 2x10 each side. (Tighter with left rotation) (Manual therapy and dry needling  - see below) - seated L UT and LS stretch, 1x20-30 seconds each motion.   Manual therapy: to reduce pain and tissue tension, improve range of motion, neuromodulation, in order to promote improved ability to complete functional activities. SUPINE -STM to posterior cervical spine focusing on bilateral UT/LS, suboccipital region, and cervical spine paraspinals.   Modality: Dry needling performed to posterior neck to decrease pain and spasms along patient's head and neck region with patient in prone utilizing 1 dry needle(s) .30mm x 30mm with 1 stick on each side at approximately C3 multifidi and 3 dry needle(s) .25mm x 30mm with 2 sticks at left upper trap and 1 stick at right upper trap. Patient educated about the risks and benefits from therapy and verbally consents to treatment. Patient demonstrated moderate to mild twitch response at both upper traps Dry needling performed by Luretha Murphy. Ilsa Iha PT, DPT who is certified in this technique.   Pt required multimodal cuing for proper technique and to facilitate improved neuromuscular control, strength, range of motion, and functional ability resulting in improved performance and form.  PATIENT EDUCATION:  Education details: Exercise purpose/form. Self management techniques.  Person educated: Patient Education method: Explanation, Demonstration, Tactile cues, Verbal cues, and Handouts Education comprehension: verbalized understanding, returned demonstration, and needs further education  HOME EXERCISE PROGRAM: Access Code: [email or web address] URL: [email or web address] Date: 08/06/2022 Prepared by: Norton Blizzard  Exercises -  Supine Head Nod Deep Neck Flexor Training  - 1-2 x daily - 2 sets - 10 reps - 5 seconds hold  HOME EXERCISE PROGRAM [[id number]] View at "[email or web address]" using code: [email or web address]  Thoracic Extension  -  Repeat 10 Repetitions, Hold 3 Seconds, Complete 1 Set, Perform 1 Times a Day      ASSESSMENT:  CLINICAL IMPRESSION: Patient arrives reporting overall improvement in condition since starting PT with FOTO score exceeding target of 67 at 68. Patient underwent dry needling and manual therapy to continue addressing remaining neck pain. Patient found dry needling uncomfortable but felt slightly better afterwards. She demonstrated twitch response at bilateral upper traps. Suboccpitial region not needled today due to no significant pain there upon palpation in prone. Plan to continue with interventions, progressed as appropriate, next session and plan for discharge as patient demonstrates stability in improvements. Patient would benefit from continued management of limiting condition by skilled physical therapist to address remaining impairments and functional limitations to work towards stated goals and return to PLOF or maximal functional independence.   OBJECTIVE IMPAIRMENTS: decreased activity tolerance, decreased coordination, decreased endurance, decreased knowledge of condition, decreased ROM, decreased strength, impaired perceived functional ability, increased muscle spasms, impaired flexibility, and  pain.   ACTIVITY LIMITATIONS: carrying, lifting, bending, and sleeping  PARTICIPATION LIMITATIONS: occupation and   difficulty sleeping, moving head, working, driving, leisure activities  PERSONAL FACTORS: Fitness, Past/current experiences, Time since onset of injury/illness/exacerbation, and 3+ comorbidities:   Vestibular migraine; Recurrent occipital headache; Bilateral occipital neuralgia; Cervico-occipital neuralgia of right side; Cervico-occipital neuralgia of left side; Chronic pain syndrome;  Vitamin D insufficiency; Hair loss; Herpes; Brittle nails; High serum cortisol; Neck pain, chronic; B12 deficiency; and Abnormal glucose, former smoker are also affecting patient's functional outcome.   REHAB POTENTIAL: Good  CLINICAL DECISION MAKING: Stable/uncomplicated  EVALUATION COMPLEXITY: Low   GOALS: Goals reviewed with patient? No  SHORT TERM GOALS: Target date: 08/20/2022  Patient will be independent with initial home exercise program for self-management of symptoms. Baseline: Initial HEP provided at IE (08/06/22); Goal status: MET   LONG TERM GOALS: Target date: 10/29/2022  Patient will be independent with a long-term home exercise program for self-management of symptoms.  Baseline: Initial HEP provided at IE (08/06/22); Goal status: In-progress  2.  Patient will demonstrate improved FOTO to equal or greater than 67 by visit #10 to demonstrate improvement in overall condition and self-reported functional ability.  Baseline: 57 (08/06/22); 68 at visit #5 (09/03/2022);  Goal status: In-progress  3.  Patient will report neck pain with functional activity at equal or less than 3/10 to improve her ability to sleep, complete work activities, and move her head with less difficulty.  Baseline: up to 9/10 (08/06/22); Goal status: In-progress  4.  Patient will demonstrate increase in cervical spine ROM by 5 degrees in rotation, flexion, and extension with no increase in pain except intermittent end range discomfort to improve her ability to move her head while completing daily activities.  Baseline: see objective  (08/06/22); Goal status: In-progress  5.  Patient will complete community, work and/or recreational activities with 50% less  limitation due to current condition.  Baseline: difficulty sleeping, moving head, working, driving, leisure activities (08/06/22); Goal status: In-progress   PLAN:  PT FREQUENCY: 1-2x/week  PT DURATION: 12 weeks  PLANNED INTERVENTIONS:  Therapeutic exercises, Therapeutic activity, Neuromuscular re-education, Patient/Family education, Self Care, Joint mobilization, Aquatic Therapy, Dry Needling, Electrical stimulation, Spinal mobilization, Cryotherapy, Moist heat, Manual therapy, and Re-evaluation  PLAN FOR NEXT SESSION: update HEP as appropriate, progressive postural/UE/cervical spine/functional strengthening, education, manual therapy and dry needling as needed.    Cira Rue, PT, DPT 09/03/2022, 7:31 PM  [facility] [address] [place], [place] [address] P: [phone] I F: [phone]

## 2022-09-04 ENCOUNTER — Encounter: Payer: Commercial Managed Care - PPO | Admitting: Physical Therapy

## 2022-09-05 ENCOUNTER — Encounter: Payer: Self-pay | Admitting: Physical Therapy

## 2022-09-05 ENCOUNTER — Ambulatory Visit: Payer: Commercial Managed Care - PPO | Admitting: Physical Therapy

## 2022-09-05 DIAGNOSIS — M542 Cervicalgia: Secondary | ICD-10-CM | POA: Diagnosis not present

## 2022-09-05 DIAGNOSIS — M62838 Other muscle spasm: Secondary | ICD-10-CM

## 2022-09-05 DIAGNOSIS — R519 Headache, unspecified: Secondary | ICD-10-CM

## 2022-09-05 NOTE — Therapy (Signed)
OUTPATIENT PHYSICAL THERAPY TREATMENT   Patient Name: Carolyn Johnston MRN: [id number] DOB:October 04, 1980, 42 y.o., female Today's Date: 09/05/2022  END OF SESSION:  PT End of Session - 09/05/22 1733     Visit Number 6    Number of Visits 13    Date for PT Re-Evaluation 10/29/22    Authorization Type [employer] PPO reporting from 08/06/2022    Authorization Time Period max combined 60 PT/OT/SLP per year    Authorization - Number of Visits 5    Progress Note Due on Visit 60    PT Start Time 1731    PT Stop Time 1810    PT Time Calculation (min) 39 min    Activity Tolerance Patient tolerated treatment well    Behavior During Therapy WFL for tasks assessed/performed                Past Medical History:  Diagnosis Date   Allergy    trees, dust mites   COVID-19    x2   Migraines    Past Surgical History:  Procedure Laterality Date   adnoids suture     DILATION AND CURETTAGE OF UTERUS     TONSILLECTOMY     WISDOM TOOTH EXTRACTION     Patient Active Problem List   Diagnosis Date Noted   Neck pain, chronic 07/27/2022   B12 deficiency 07/27/2022   Encounter for screening for HIV 07/27/2022   Need for hepatitis C screening test 07/27/2022   Abnormal glucose 07/27/2022   High serum cortisol 06/28/2021   Brittle nails 06/26/2021   Hair loss 03/24/2021   Birth control counseling 03/24/2021   Herpes 03/24/2021   Vitamin D insufficiency 06/06/2016   Recurrent occipital headache 04/22/2016   Bilateral occipital neuralgia 04/22/2016   Cervico-occipital neuralgia of right side 04/22/2016   Cervico-occipital neuralgia of left side 04/22/2016   Chronic pain syndrome 04/22/2016   Vestibular migraine 06/01/2013    PCP: Dana Allan, MD  REFERRING PROVIDER: Dana Allan, MD  REFERRING DIAG: neck pain, chronic  THERAPY DIAG:  Cervicalgia  Nonintractable headache, unspecified chronicity pattern, unspecified headache type  Other muscle spasm  Rationale for Evaluation  and Treatment: Rehabilitation  ONSET DATE: 10 years prior to PT evaluation.   PERTINENT HISTORY:  Patient is a 42 y.o. female who presents to outpatient physical therapy with a referral for medical diagnosis chronic neck pain. This patient's chief complaints consist of chronic neck pain and incompletely controlled migraine headaches leading to the following functional deficits: difficulty sleeping, moving head, working, driving, leisure activities. Relevant past medical history and comorbidities include Vestibular migraine; Recurrent occipital headache; Bilateral occipital neuralgia; Cervico-occipital neuralgia of right side; Cervico-occipital neuralgia of left side; Chronic pain syndrome; Vitamin D insufficiency; Hair loss; Herpes; Brittle nails; High serum cortisol; Neck pain, chronic; B12 deficiency; and Abnormal glucose, former smoker.  Patient denies hx of cancer, stroke, seizures, lung problems, heart problems, diabetes, unexplained weight loss, unexplained changes in bowel or bladder problems, unexplained stumbling or dropping things, osteoporosis, and spinal surgery  SUBJECTIVE:  SUBJECTIVE STATEMENT: Patient reports she was really sore yesterday over her upper traps. She had trouble stretching her neck yesterday. She is feeling better today but has a headache starting.   PAIN:  Are you having pain?  NPRS scale: 2/10 over bilateral UT. 3/10 pain behind right eye   PRECAUTIONS: None  PATIENT GOALS: "to see how it can improve"   OBJECTIVE  TODAY'S TREATMENT:  Therapeutic exercise: to centralize symptoms and improve ROM, strength, muscular endurance, and activity tolerance required for successful completion of functional activities.  - Upper body ergometer at level 5 to  encourage joint  nutrition, warm tissue, induce analgesic effect of aerobic exercise, improve muscular strength and endurance,  and prepare for remainder of session. 5 minutes, changing directions every 2 min.  - Sidelying open book (thoracic rotation) to improve thoracic, shoulder girdle, and upper trunk mobility. 2x10 each side. (Tighter with left rotation). (Manual therapy - see below) - prone cervical retraction, 2x10 with 5 second hold, from prone with face in cradle.  - prone bilateral scapular retraction in "W" position, 2x10 with 5 second hold.  - prone "Y" without hold, 2x10 - seated L UT and LS stretch review, 1x30 seconds each motion.   Manual therapy: to reduce pain and tissue tension, improve range of motion, neuromodulation, in order to promote improved ability to complete functional activities. SUPINE -STM to posterior cervical spine focusing on bilateral UT/LS, suboccipital region.  Pt required multimodal cuing for proper technique and to facilitate improved neuromuscular control, strength, range of motion, and functional ability resulting in improved performance and form.  PATIENT EDUCATION:  Education details: Exercise purpose/form. Self management techniques.  Person educated: Patient Education method: Explanation, Demonstration, Tactile cues, Verbal cues, and Handouts Education comprehension: verbalized understanding, returned demonstration, and needs further education  HOME EXERCISE PROGRAM: Access Code: [email or web address] URL: [email or web address] Date: 08/06/2022 Prepared by: Norton Blizzard  Exercises - Supine Head Nod Deep Neck Flexor Training  - 1-2 x daily - 2 sets - 10 reps - 5 seconds hold  HOME EXERCISE PROGRAM [[id number]] View at "[email or web address]" using code: [email or web address]  Thoracic Extension  -  Repeat 10 Repetitions, Hold 3 Seconds, Complete 1 Set, Perform 1 Times a Day      ASSESSMENT:  CLINICAL IMPRESSION: Patient arrives reporting just getting over the  increased soreness in her upper traps that she experienced after dry needling last PT session. Today's session focused on cervical spine and upper thoracic mobility as well as postural strengthening exercises. Patient tolerated session well overall. She fatigued quickly with postural exercises. Patient would benefit from continued management of limiting condition by skilled physical therapist to address remaining impairments and functional limitations to work towards stated goals and return to PLOF or maximal functional independence.   OBJECTIVE IMPAIRMENTS: decreased activity tolerance, decreased coordination, decreased endurance, decreased knowledge of condition, decreased ROM, decreased strength, impaired perceived functional ability, increased muscle spasms, impaired flexibility, and pain.   ACTIVITY LIMITATIONS: carrying, lifting, bending, and sleeping  PARTICIPATION LIMITATIONS: occupation and   difficulty sleeping, moving head, working, driving, leisure activities  PERSONAL FACTORS: Fitness, Past/current experiences, Time since onset of injury/illness/exacerbation, and 3+ comorbidities:   Vestibular migraine; Recurrent occipital headache; Bilateral occipital neuralgia; Cervico-occipital neuralgia of right side; Cervico-occipital neuralgia of left side; Chronic pain syndrome; Vitamin D insufficiency; Hair loss; Herpes; Brittle nails; High serum cortisol; Neck pain, chronic; B12 deficiency; and Abnormal glucose, former smoker are also affecting patient's functional outcome.   REHAB POTENTIAL: Good  CLINICAL DECISION MAKING: Stable/uncomplicated  EVALUATION  COMPLEXITY: Low   GOALS: Goals reviewed with patient? No  SHORT TERM GOALS: Target date: 08/20/2022  Patient will be independent with initial home exercise program for self-management of symptoms. Baseline: Initial HEP provided at IE (08/06/22); Goal status: MET   LONG TERM GOALS: Target date: 10/29/2022  Patient will be independent  with a long-term home exercise program for self-management of symptoms.  Baseline: Initial HEP provided at IE (08/06/22); Goal status: In-progress  2.  Patient will demonstrate improved FOTO to equal or greater than 67 by visit #10 to demonstrate improvement in overall condition and self-reported functional ability.  Baseline: 57 (08/06/22); 68 at visit #5 (09/03/2022);  Goal status: In-progress  3.  Patient will report neck pain with functional activity at equal or less than 3/10 to improve her ability to sleep, complete work activities, and move her head with less difficulty.  Baseline: up to 9/10 (08/06/22); Goal status: In-progress  4.  Patient will demonstrate increase in cervical spine ROM by 5 degrees in rotation, flexion, and extension with no increase in pain except intermittent end range discomfort to improve her ability to move her head while completing daily activities.  Baseline: see objective  (08/06/22); Goal status: In-progress  5.  Patient will complete community, work and/or recreational activities with 50% less  limitation due to current condition.  Baseline: difficulty sleeping, moving head, working, driving, leisure activities (08/06/22); Goal status: In-progress   PLAN:  PT FREQUENCY: 1-2x/week  PT DURATION: 12 weeks  PLANNED INTERVENTIONS: Therapeutic exercises, Therapeutic activity, Neuromuscular re-education, Patient/Family education, Self Care, Joint mobilization, Aquatic Therapy, Dry Needling, Electrical stimulation, Spinal mobilization, Cryotherapy, Moist heat, Manual therapy, and Re-evaluation  PLAN FOR NEXT SESSION: update HEP as appropriate, progressive postural/UE/cervical spine/functional strengthening, education, manual therapy and dry needling as needed.    Cira Rue, PT, DPT 09/05/2022, 6:26 PM  [facility] [address] [place], [place] [address] P: [phone] I F: [phone]

## 2022-09-09 ENCOUNTER — Other Ambulatory Visit: Payer: Commercial Managed Care - PPO

## 2022-09-09 ENCOUNTER — Encounter: Payer: Commercial Managed Care - PPO | Admitting: Physical Therapy

## 2022-09-10 ENCOUNTER — Ambulatory Visit: Payer: Commercial Managed Care - PPO | Admitting: Physical Therapy

## 2022-09-10 ENCOUNTER — Encounter: Payer: Self-pay | Admitting: Physical Therapy

## 2022-09-10 DIAGNOSIS — M62838 Other muscle spasm: Secondary | ICD-10-CM

## 2022-09-10 DIAGNOSIS — R519 Headache, unspecified: Secondary | ICD-10-CM

## 2022-09-10 DIAGNOSIS — M542 Cervicalgia: Secondary | ICD-10-CM | POA: Diagnosis not present

## 2022-09-10 NOTE — Therapy (Signed)
OUTPATIENT PHYSICAL THERAPY TREATMENT   Patient Name: Carolyn Johnston MRN: [id number] DOB:1980/12/30, 42 y.o., female Today's Date: 09/10/2022  END OF SESSION:  PT End of Session - 09/10/22 1743     Visit Number 7    Number of Visits 13    Date for PT Re-Evaluation 10/29/22    Authorization Type [employer] PPO reporting from 08/06/2022    Authorization Time Period max combined 60 PT/OT/SLP per year    Authorization - Number of Visits 6    Progress Note Due on Visit 60    PT Start Time 1737    PT Stop Time 1815    PT Time Calculation (min) 38 min    Activity Tolerance Patient tolerated treatment well    Behavior During Therapy WFL for tasks assessed/performed                 Past Medical History:  Diagnosis Date   Allergy    trees, dust mites   COVID-19    x2   Migraines    Past Surgical History:  Procedure Laterality Date   adnoids suture     DILATION AND CURETTAGE OF UTERUS     TONSILLECTOMY     WISDOM TOOTH EXTRACTION     Patient Active Problem List   Diagnosis Date Noted   Neck pain, chronic 07/27/2022   B12 deficiency 07/27/2022   Encounter for screening for HIV 07/27/2022   Need for hepatitis C screening test 07/27/2022   Abnormal glucose 07/27/2022   High serum cortisol 06/28/2021   Brittle nails 06/26/2021   Hair loss 03/24/2021   Birth control counseling 03/24/2021   Herpes 03/24/2021   Vitamin D insufficiency 06/06/2016   Recurrent occipital headache 04/22/2016   Bilateral occipital neuralgia 04/22/2016   Cervico-occipital neuralgia of right side 04/22/2016   Cervico-occipital neuralgia of left side 04/22/2016   Chronic pain syndrome 04/22/2016   Vestibular migraine 06/01/2013    PCP: Dana Allan, MD  REFERRING PROVIDER: Dana Allan, MD  REFERRING DIAG: neck pain, chronic  THERAPY DIAG:  Cervicalgia  Nonintractable headache, unspecified chronicity pattern, unspecified headache type  Other muscle spasm  Rationale for Evaluation  and Treatment: Rehabilitation  ONSET DATE: 10 years prior to PT evaluation.   PERTINENT HISTORY:  Patient is a 42 y.o. female who presents to outpatient physical therapy with a referral for medical diagnosis chronic neck pain. This patient's chief complaints consist of chronic neck pain and incompletely controlled migraine headaches leading to the following functional deficits: difficulty sleeping, moving head, working, driving, leisure activities. Relevant past medical history and comorbidities include Vestibular migraine; Recurrent occipital headache; Bilateral occipital neuralgia; Cervico-occipital neuralgia of right side; Cervico-occipital neuralgia of left side; Chronic pain syndrome; Vitamin D insufficiency; Hair loss; Herpes; Brittle nails; High serum cortisol; Neck pain, chronic; B12 deficiency; and Abnormal glucose, former smoker.  Patient denies hx of cancer, stroke, seizures, lung problems, heart problems, diabetes, unexplained weight loss, unexplained changes in bowel or bladder problems, unexplained stumbling or dropping things, osteoporosis, and spinal surgery  SUBJECTIVE:  SUBJECTIVE STATEMENT: Patient she was really sore after last PT session. She states she continues to have pain at the base of her cervical spine that is the worst when she extends her neck and pulls when she flexes her neck. She got botox injections earlier today and has a precaution of not having any rubbing or manual therapy over the injection sites for three days.   PAIN:  Are you having pain?  NPRS scale: 3/10 at lower cervical spine near CT junction when extending neck  PRECAUTIONS: None  PATIENT GOALS: "to see how it can improve"   OBJECTIVE  TODAY'S TREATMENT:  Therapeutic exercise: to centralize symptoms  and improve ROM, strength, muscular endurance, and activity tolerance required for successful completion of functional activities.  - Upper body ergometer at level 5 to encourage joint nutrition, warm tissue, induce analgesic effect of aerobic exercise, improve muscular strength and endurance,  and prepare for remainder of session. 5 minutes, changing directions every 1 min.  - seated cervical spine retraction with self overpressure at chin, 2x10 (no change in concordant pain with cervical extension).  - seated cervical spine retraction to tolerated extension, 1x10, increasing right UT pain, improved ROM to cervical extension. 1x10 improved extension ROM but continues to have a lot of pain at left upper trap (patient does not like this exercise).  - prone cervical retraction, 2x10 with 5 second hold, from prone with face in cradle.  - prone bilateral scapular retraction in "W" position, 2x10 with 5 second hold.  - prone "Y" without hold, 2x10  Pt required multimodal cuing for proper technique and to facilitate improved neuromuscular control, strength, range of motion, and functional ability resulting in improved performance and form.  PATIENT EDUCATION:  Education details: Exercise purpose/form. Self management techniques.  Person educated: Patient Education method: Explanation, Demonstration, Tactile cues, Verbal cues, and Handouts Education comprehension: verbalized understanding, returned demonstration, and needs further education  HOME EXERCISE PROGRAM: Access Code: [email or web address] URL: [email or web address] Date: 08/06/2022 Prepared by: Norton Blizzard  Exercises - Supine Head Nod Deep Neck Flexor Training  - 1-2 x daily - 2 sets - 10 reps - 5 seconds hold  HOME EXERCISE PROGRAM [[id number]] View at "[email or web address]" using code: [email or web address]  Thoracic Extension  -  Repeat 10 Repetitions, Hold 3 Seconds, Complete 1 Set, Perform 1 Times a Day      ASSESSMENT:  CLINICAL  IMPRESSION: Patient arrives reporting increased pain after last PT session and that she is unable to receive manual therapy today due to botox injections earlier in the day. Today's session focused on ROM and postural strengthening exercises to improve patient's pain and activity tolerance. Repeated motions in the sagittal plane were tested and although patient did seem to have a mild increase in ROM, she had increased pain and was likely getting better and pushing herself through painful motion instead of actually gaining motion. Therefore end range extension based repeated motions were discontinued. Patient reported no worsening by end of session. Plan to continue with exercise based treatment next session as patient will still be within her 3 day period of manual therapy being contraindicated over her botox injection sites. Patient would benefit from continued management of limiting condition by skilled physical therapist to address remaining impairments and functional limitations to work towards stated goals and return to PLOF or maximal functional independence.   OBJECTIVE IMPAIRMENTS: decreased activity tolerance, decreased coordination, decreased endurance, decreased knowledge of condition, decreased ROM, decreased strength, impaired perceived functional ability, increased muscle spasms, impaired flexibility, and  pain.   ACTIVITY LIMITATIONS: carrying, lifting, bending, and sleeping  PARTICIPATION LIMITATIONS: occupation and   difficulty sleeping, moving head, working, driving, leisure activities  PERSONAL FACTORS: Fitness, Past/current experiences, Time since onset of injury/illness/exacerbation, and 3+ comorbidities:   Vestibular migraine; Recurrent occipital headache; Bilateral occipital neuralgia; Cervico-occipital neuralgia of right side; Cervico-occipital neuralgia of left side; Chronic pain syndrome; Vitamin D insufficiency; Hair loss; Herpes; Brittle nails; High serum cortisol; Neck pain,  chronic; B12 deficiency; and Abnormal glucose, former smoker are also affecting patient's functional outcome.   REHAB POTENTIAL: Good  CLINICAL DECISION MAKING: Stable/uncomplicated  EVALUATION COMPLEXITY: Low   GOALS: Goals reviewed with patient? No  SHORT TERM GOALS: Target date: 08/20/2022  Patient will be independent with initial home exercise program for self-management of symptoms. Baseline: Initial HEP provided at IE (08/06/22); Goal status: MET   LONG TERM GOALS: Target date: 10/29/2022  Patient will be independent with a long-term home exercise program for self-management of symptoms.  Baseline: Initial HEP provided at IE (08/06/22); Goal status: In-progress  2.  Patient will demonstrate improved FOTO to equal or greater than 67 by visit #10 to demonstrate improvement in overall condition and self-reported functional ability.  Baseline: 57 (08/06/22); 68 at visit #5 (09/03/2022);  Goal status: In-progress  3.  Patient will report neck pain with functional activity at equal or less than 3/10 to improve her ability to sleep, complete work activities, and move her head with less difficulty.  Baseline: up to 9/10 (08/06/22); Goal status: In-progress  4.  Patient will demonstrate increase in cervical spine ROM by 5 degrees in rotation, flexion, and extension with no increase in pain except intermittent end range discomfort to improve her ability to move her head while completing daily activities.  Baseline: see objective  (08/06/22); Goal status: In-progress  5.  Patient will complete community, work and/or recreational activities with 50% less  limitation due to current condition.  Baseline: difficulty sleeping, moving head, working, driving, leisure activities (08/06/22); Goal status: In-progress   PLAN:  PT FREQUENCY: 1-2x/week  PT DURATION: 12 weeks  PLANNED INTERVENTIONS: Therapeutic exercises, Therapeutic activity, Neuromuscular re-education, Patient/Family  education, Self Care, Joint mobilization, Aquatic Therapy, Dry Needling, Electrical stimulation, Spinal mobilization, Cryotherapy, Moist heat, Manual therapy, and Re-evaluation  PLAN FOR NEXT SESSION: update HEP as appropriate, progressive postural/UE/cervical spine/functional strengthening, education, manual therapy and dry needling as needed.    Cira Rue, PT, DPT 09/10/2022, 7:38 PM  [facility] [address] [place], [place] [address] P: [phone] I F: [phone]

## 2022-09-12 ENCOUNTER — Ambulatory Visit: Payer: Commercial Managed Care - PPO | Admitting: Physical Therapy

## 2022-09-12 ENCOUNTER — Encounter: Payer: Self-pay | Admitting: Physical Therapy

## 2022-09-12 DIAGNOSIS — M542 Cervicalgia: Secondary | ICD-10-CM | POA: Diagnosis not present

## 2022-09-12 DIAGNOSIS — R519 Headache, unspecified: Secondary | ICD-10-CM

## 2022-09-12 DIAGNOSIS — M62838 Other muscle spasm: Secondary | ICD-10-CM

## 2022-09-12 NOTE — Therapy (Signed)
OUTPATIENT PHYSICAL THERAPY TREATMENT   Patient Name: Carolyn Johnston MRN: [id number] DOB:Jun 18, 1980, 42 y.o., female Today's Date: 09/12/2022  END OF SESSION:  PT End of Session - 09/12/22 1738     Visit Number 8    Number of Visits 13    Date for PT Re-Evaluation 10/29/22    Authorization Type [employer] PPO reporting from 08/06/2022    Authorization Time Period max combined 60 PT/OT/SLP per year    Authorization - Number of Visits 7    Progress Note Due on Visit 60    PT Start Time 1738    PT Stop Time 1816    PT Time Calculation (min) 38 min    Activity Tolerance Patient tolerated treatment well    Behavior During Therapy WFL for tasks assessed/performed             Past Medical History:  Diagnosis Date   Allergy    trees, dust mites   COVID-19    x2   Migraines    Past Surgical History:  Procedure Laterality Date   adnoids suture     DILATION AND CURETTAGE OF UTERUS     TONSILLECTOMY     WISDOM TOOTH EXTRACTION     Patient Active Problem List   Diagnosis Date Noted   Neck pain, chronic 07/27/2022   B12 deficiency 07/27/2022   Encounter for screening for HIV 07/27/2022   Need for hepatitis C screening test 07/27/2022   Abnormal glucose 07/27/2022   High serum cortisol 06/28/2021   Brittle nails 06/26/2021   Hair loss 03/24/2021   Birth control counseling 03/24/2021   Herpes 03/24/2021   Vitamin D insufficiency 06/06/2016   Recurrent occipital headache 04/22/2016   Bilateral occipital neuralgia 04/22/2016   Cervico-occipital neuralgia of right side 04/22/2016   Cervico-occipital neuralgia of left side 04/22/2016   Chronic pain syndrome 04/22/2016   Vestibular migraine 06/01/2013    PCP: Dana Allan, MD  REFERRING PROVIDER: Dana Allan, MD  REFERRING DIAG: neck pain, chronic  THERAPY DIAG:  Cervicalgia  Nonintractable headache, unspecified chronicity pattern, unspecified headache type  Other muscle spasm  Rationale for Evaluation and  Treatment: Rehabilitation  ONSET DATE: 10 years prior to PT evaluation.   PERTINENT HISTORY:  Patient is a 42 y.o. female who presents to outpatient physical therapy with a referral for medical diagnosis chronic neck pain. This patient's chief complaints consist of chronic neck pain and incompletely controlled migraine headaches leading to the following functional deficits: difficulty sleeping, moving head, working, driving, leisure activities. Relevant past medical history and comorbidities include Vestibular migraine; Recurrent occipital headache; Bilateral occipital neuralgia; Cervico-occipital neuralgia of right side; Cervico-occipital neuralgia of left side; Chronic pain syndrome; Vitamin D insufficiency; Hair loss; Herpes; Brittle nails; High serum cortisol; Neck pain, chronic; B12 deficiency; and Abnormal glucose, former smoker.  Patient denies hx of cancer, stroke, seizures, lung problems, heart problems, diabetes, unexplained weight loss, unexplained changes in bowel or bladder problems, unexplained stumbling or dropping things, osteoporosis, and spinal surgery  SUBJECTIVE:  SUBJECTIVE STATEMENT: Patient states she was really sore between her scapulae and at her CT junction. She did some open book exercise and is feeling better now. She has been stressed lately. It rained today and she did not have a headache. She felt stress/tension and she did not get a migraine or headache.   PAIN:  Are you having pain?  NPRS scale: 3/10 at lower cervical spine near CT junction when extending neck and bending to the right.   PRECAUTIONS: None  PATIENT GOALS: "to see how it can improve"   OBJECTIVE  TODAY'S TREATMENT:  Therapeutic exercise: to centralize symptoms and improve ROM, strength, muscular  endurance, and activity tolerance required for successful completion of functional activities.  - Upper body ergometer at level 5 to encourage joint nutrition, warm tissue, induce analgesic effect of aerobic exercise, improve muscular strength and endurance,  and prepare for remainder of session. 5 minutes.  - seated lat pull down, 3x10 at 20# - seated scapular rows, 3x10 at 10# - standing scapular setting at wall, 1x10 AROM - standing scapular setting at wall with 2#DB, 3x10 - standing scapular retraction with B ER against BlueTB, 3x10 - prone cervical retraction, 3x10 with 5 second hold, from prone with face in cradle.  - rhomboid/middle trap stretch holding door jamb and allowing scapulae to protract. 2x20 seconds  Pt required multimodal cuing for proper technique and to facilitate improved neuromuscular control, strength, range of motion, and functional ability resulting in improved performance and form.  PATIENT EDUCATION:  Education details: Exercise purpose/form. Self management techniques.  Person educated: Patient Education method: Explanation, Demonstration, Tactile cues, Verbal cues, and Handouts Education comprehension: verbalized understanding, returned demonstration, and needs further education  HOME EXERCISE PROGRAM: Access Code: [email or web address] URL: [email or web address] Date: 08/06/2022 Prepared by: Norton Blizzard  Exercises - Supine Head Nod Deep Neck Flexor Training  - 1-2 x daily - 2 sets - 10 reps - 5 seconds hold  HOME EXERCISE PROGRAM [[id number]] View at "[email or web address]" using code: [email or web address]  Thoracic Extension  -  Repeat 10 Repetitions, Hold 3 Seconds, Complete 1 Set, Perform 1 Times a Day      ASSESSMENT:  CLINICAL IMPRESSION: Patient arrives reporting increased soreness at rhomboids and mid trap as well as increased pain at the CT junction following last PT session. Patient's symptoms correlate with DOMS at the rhomboids and mid trap likely from  the prone strengthening exercises last session and joint pain at the CT junction from the trial of repeated motions with extension principle. Repeated motions for extension abandoned as unhelpful and postural exercises regressed to avoid further irritating symptoms. Patient completed exercises with no increase in pain. She again did not receive manual therapy due to contraindications from Botox injections 2 days ago. Patient would benefit from continued management of limiting condition by skilled physical therapist to address remaining impairments and functional limitations to work towards stated goals and return to PLOF or maximal functional independence.   OBJECTIVE IMPAIRMENTS: decreased activity tolerance, decreased coordination, decreased endurance, decreased knowledge of condition, decreased ROM, decreased strength, impaired perceived functional ability, increased muscle spasms, impaired flexibility, and pain.   ACTIVITY LIMITATIONS: carrying, lifting, bending, and sleeping  PARTICIPATION LIMITATIONS: occupation and   difficulty sleeping, moving head, working, driving, leisure activities  PERSONAL FACTORS: Fitness, Past/current experiences, Time since onset of injury/illness/exacerbation, and 3+ comorbidities:   Vestibular migraine; Recurrent occipital headache; Bilateral occipital neuralgia; Cervico-occipital neuralgia of right side; Cervico-occipital neuralgia of left side; Chronic pain syndrome; Vitamin D insufficiency; Hair loss;  Herpes; Brittle nails; High serum cortisol; Neck pain, chronic; B12 deficiency; and Abnormal glucose, former smoker are also affecting patient's functional outcome.   REHAB POTENTIAL: Good  CLINICAL DECISION MAKING: Stable/uncomplicated  EVALUATION COMPLEXITY: Low   GOALS: Goals reviewed with patient? No  SHORT TERM GOALS: Target date: 08/20/2022  Patient will be independent with initial home exercise program for self-management of symptoms. Baseline: Initial  HEP provided at IE (08/06/22); Goal status: MET   LONG TERM GOALS: Target date: 10/29/2022  Patient will be independent with a long-term home exercise program for self-management of symptoms.  Baseline: Initial HEP provided at IE (08/06/22); Goal status: In-progress  2.  Patient will demonstrate improved FOTO to equal or greater than 67 by visit #10 to demonstrate improvement in overall condition and self-reported functional ability.  Baseline: 57 (08/06/22); 68 at visit #5 (09/03/2022);  Goal status: In-progress  3.  Patient will report neck pain with functional activity at equal or less than 3/10 to improve her ability to sleep, complete work activities, and move her head with less difficulty.  Baseline: up to 9/10 (08/06/22); Goal status: In-progress  4.  Patient will demonstrate increase in cervical spine ROM by 5 degrees in rotation, flexion, and extension with no increase in pain except intermittent end range discomfort to improve her ability to move her head while completing daily activities.  Baseline: see objective  (08/06/22); Goal status: In-progress  5.  Patient will complete community, work and/or recreational activities with 50% less  limitation due to current condition.  Baseline: difficulty sleeping, moving head, working, driving, leisure activities (08/06/22); Goal status: In-progress   PLAN:  PT FREQUENCY: 1-2x/week  PT DURATION: 12 weeks  PLANNED INTERVENTIONS: Therapeutic exercises, Therapeutic activity, Neuromuscular re-education, Patient/Family education, Self Care, Joint mobilization, Aquatic Therapy, Dry Needling, Electrical stimulation, Spinal mobilization, Cryotherapy, Moist heat, Manual therapy, and Re-evaluation  PLAN FOR NEXT SESSION: update HEP as appropriate, progressive postural/UE/cervical spine/functional strengthening, education, manual therapy and dry needling as needed.    Cira Rue, PT, DPT 09/12/2022, 6:50 PM  [facility] [address] [place], [place] [address] P: [phone] I F: [phone]

## 2022-09-17 ENCOUNTER — Ambulatory Visit: Payer: Commercial Managed Care - PPO | Admitting: Physical Therapy

## 2022-09-17 ENCOUNTER — Other Ambulatory Visit: Payer: Commercial Managed Care - PPO

## 2022-09-17 ENCOUNTER — Encounter: Payer: Self-pay | Admitting: Physical Therapy

## 2022-09-17 DIAGNOSIS — M62838 Other muscle spasm: Secondary | ICD-10-CM

## 2022-09-17 DIAGNOSIS — R519 Headache, unspecified: Secondary | ICD-10-CM

## 2022-09-17 DIAGNOSIS — M542 Cervicalgia: Secondary | ICD-10-CM | POA: Diagnosis not present

## 2022-09-17 NOTE — Therapy (Signed)
OUTPATIENT PHYSICAL THERAPY TREATMENT   Patient Name: Carolyn Johnston MRN: [id number] DOB:04-16-1980, 42 y.o., female Today's Date: 09/17/2022  END OF SESSION:  PT End of Session - 09/17/22 1740     Visit Number 9    Number of Visits 13    Date for PT Re-Evaluation 10/29/22    Authorization Type [employer] PPO reporting from 08/06/2022    Authorization Time Period max combined 60 PT/OT/SLP per year    Authorization - Number of Visits 8    Progress Note Due on Visit 60    PT Start Time 1738    PT Stop Time 1816    PT Time Calculation (min) 38 min    Activity Tolerance Patient tolerated treatment well    Behavior During Therapy WFL for tasks assessed/performed             Past Medical History:  Diagnosis Date   Allergy    trees, dust mites   COVID-19    x2   Migraines    Past Surgical History:  Procedure Laterality Date   adnoids suture     DILATION AND CURETTAGE OF UTERUS     TONSILLECTOMY     WISDOM TOOTH EXTRACTION     Patient Active Problem List   Diagnosis Date Noted   Neck pain, chronic 07/27/2022   B12 deficiency 07/27/2022   Encounter for screening for HIV 07/27/2022   Need for hepatitis C screening test 07/27/2022   Abnormal glucose 07/27/2022   High serum cortisol 06/28/2021   Brittle nails 06/26/2021   Hair loss 03/24/2021   Birth control counseling 03/24/2021   Herpes 03/24/2021   Vitamin D insufficiency 06/06/2016   Recurrent occipital headache 04/22/2016   Bilateral occipital neuralgia 04/22/2016   Cervico-occipital neuralgia of right side 04/22/2016   Cervico-occipital neuralgia of left side 04/22/2016   Chronic pain syndrome 04/22/2016   Vestibular migraine 06/01/2013    PCP: Dana Allan, MD  REFERRING PROVIDER: Dana Allan, MD  REFERRING DIAG: neck pain, chronic  THERAPY DIAG:  Cervicalgia  Nonintractable headache, unspecified chronicity pattern, unspecified headache type  Other muscle spasm  Rationale for Evaluation and  Treatment: Rehabilitation  ONSET DATE: 10 years prior to PT evaluation.   PERTINENT HISTORY:  Patient is a 42 y.o. female who presents to outpatient physical therapy with a referral for medical diagnosis chronic neck pain. This patient's chief complaints consist of chronic neck pain and incompletely controlled migraine headaches leading to the following functional deficits: difficulty sleeping, moving head, working, driving, leisure activities. Relevant past medical history and comorbidities include Vestibular migraine; Recurrent occipital headache; Bilateral occipital neuralgia; Cervico-occipital neuralgia of right side; Cervico-occipital neuralgia of left side; Chronic pain syndrome; Vitamin D insufficiency; Hair loss; Herpes; Brittle nails; High serum cortisol; Neck pain, chronic; B12 deficiency; and Abnormal glucose, former smoker.  Patient denies hx of cancer, stroke, seizures, lung problems, heart problems, diabetes, unexplained weight loss, unexplained changes in bowel or bladder problems, unexplained stumbling or dropping things, osteoporosis, and spinal surgery.  SUBJECTIVE:  SUBJECTIVE STATEMENT: Patient states she is okay. She states she has had a lot of stress since last PT session. She does not have a headache today even though it rained earlier today. Patient reports she felt okay after last PT session.   PAIN:  Are you having pain?  NPRS scale: 2/10 at lower cervical spine near CT junction when extending neck and turning to the left.   PRECAUTIONS: None  PATIENT GOALS: "to see how it can improve"   OBJECTIVE  TODAY'S TREATMENT:  Therapeutic exercise: to centralize symptoms and improve ROM, strength, muscular endurance, and activity tolerance required for successful completion of  functional activities.  - Upper body ergometer at level 5 to encourage joint nutrition, warm tissue, induce analgesic effect of aerobic exercise, improve muscular strength and endurance,  and prepare for remainder of session. 5 minutes.  - seated lat pull down, 3x10 at 25# - seated scapular rows, 3x10 at 15# - standing scapular setting at wall with 5#DB, 3x10 - standing scapular retraction with B ER against BlackTB, 3x10 - standing bilateral horizontal abduction with scapular retraction using BlueTB.  - prone cervical spine retraction, 1x10 sith 5 second hold - prone cervical spine retraction hold with UE "W" with scapular retraction, 3x10 - standing bilateral D2 flexion/extension pull apart, 3x10 each side with BlueTB - seated overhead press, 3x10 with 5#DB - Education on HEP including handout    Pt required multimodal cuing for proper technique and to facilitate improved neuromuscular control, strength, range of motion, and functional ability resulting in improved performance and form.  PATIENT EDUCATION:  Education details: Exercise purpose/form. Self management techniques.  Person educated: Patient Education method: Explanation, Demonstration, Tactile cues, Verbal cues, and Handouts Education comprehension: verbalized understanding, returned demonstration, and needs further education  HOME EXERCISE PROGRAM: Access Code: [email or web address] URL: [email or web address] Date: 09/17/2022 Prepared by: Norton Blizzard  Exercises - Prone W Scapular Retraction  - 3-5 x weekly - 3 sets - 10 reps - Seated Shoulder Diagonal with Resistance  - 3-5 x weekly - 3 sets - 10 reps - Standing Shoulder Horizontal Abduction with Resistance  - 3-5 x weekly - 3 sets - 10 reps  HOME EXERCISE PROGRAM [[id number]] View at "[email or web address]" using code: [email or web address]  Thoracic Extension  -  Repeat 10 Repetitions, Hold 3 Seconds, Complete 1 Set, Perform 1 Times a Day      ASSESSMENT:  CLINICAL  IMPRESSION: Patient arrives reporting improved soreness but continued pain with end range motion with neck extension and left rotation. Today's session continued to focus on progressive postural and UE strengthening to help patient continue to improve activity tolerance. Patient tolerated session well and HEP was updated as appropriate due to her improving activity tolerance. Plan to complete 10th visit progress note next session. Patient would benefit from continued management of limiting condition by skilled physical therapist to address remaining impairments and functional limitations to work towards stated goals and return to PLOF or maximal functional independence.    OBJECTIVE IMPAIRMENTS: decreased activity tolerance, decreased coordination, decreased endurance, decreased knowledge of condition, decreased ROM, decreased strength, impaired perceived functional ability, increased muscle spasms, impaired flexibility, and pain.   ACTIVITY LIMITATIONS: carrying, lifting, bending, and sleeping  PARTICIPATION LIMITATIONS: occupation and   difficulty sleeping, moving head, working, driving, leisure activities  PERSONAL FACTORS: Fitness, Past/current experiences, Time since onset of injury/illness/exacerbation, and 3+ comorbidities:   Vestibular migraine; Recurrent occipital headache; Bilateral occipital neuralgia; Cervico-occipital neuralgia of right side; Cervico-occipital neuralgia of left side; Chronic pain syndrome; Vitamin  D insufficiency; Hair loss; Herpes; Brittle nails; High serum cortisol; Neck pain, chronic; B12 deficiency; and Abnormal glucose, former smoker are also affecting patient's functional outcome.   REHAB POTENTIAL: Good  CLINICAL DECISION MAKING: Stable/uncomplicated  EVALUATION COMPLEXITY: Low   GOALS: Goals reviewed with patient? No  SHORT TERM GOALS: Target date: 08/20/2022  Patient will be independent with initial home exercise program for self-management of  symptoms. Baseline: Initial HEP provided at IE (08/06/22); Goal status: MET   LONG TERM GOALS: Target date: 10/29/2022  Patient will be independent with a long-term home exercise program for self-management of symptoms.  Baseline: Initial HEP provided at IE (08/06/22); Goal status: In-progress  2.  Patient will demonstrate improved FOTO to equal or greater than 67 by visit #10 to demonstrate improvement in overall condition and self-reported functional ability.  Baseline: 57 (08/06/22); 68 at visit #5 (09/03/2022);  Goal status: In-progress  3.  Patient will report neck pain with functional activity at equal or less than 3/10 to improve her ability to sleep, complete work activities, and move her head with less difficulty.  Baseline: up to 9/10 (08/06/22); Goal status: In-progress  4.  Patient will demonstrate increase in cervical spine ROM by 5 degrees in rotation, flexion, and extension with no increase in pain except intermittent end range discomfort to improve her ability to move her head while completing daily activities.  Baseline: see objective  (08/06/22); Goal status: In-progress  5.  Patient will complete community, work and/or recreational activities with 50% less  limitation due to current condition.  Baseline: difficulty sleeping, moving head, working, driving, leisure activities (08/06/22); Goal status: In-progress   PLAN:  PT FREQUENCY: 1-2x/week  PT DURATION: 12 weeks  PLANNED INTERVENTIONS: Therapeutic exercises, Therapeutic activity, Neuromuscular re-education, Patient/Family education, Self Care, Joint mobilization, Aquatic Therapy, Dry Needling, Electrical stimulation, Spinal mobilization, Cryotherapy, Moist heat, Manual therapy, and Re-evaluation  PLAN FOR NEXT SESSION: update HEP as appropriate, progressive postural/UE/cervical spine/functional strengthening, education, manual therapy and dry needling as needed.    Cira Rue, PT, DPT 09/17/2022, 6:42  PM  [facility] [address] [place], [place] [address] P: [phone] I F: [phone]

## 2022-09-19 ENCOUNTER — Encounter: Payer: Self-pay | Admitting: Physical Therapy

## 2022-09-19 ENCOUNTER — Ambulatory Visit: Payer: Commercial Managed Care - PPO | Attending: Family Medicine | Admitting: Physical Therapy

## 2022-09-19 DIAGNOSIS — R519 Headache, unspecified: Secondary | ICD-10-CM | POA: Diagnosis present

## 2022-09-19 DIAGNOSIS — M62838 Other muscle spasm: Secondary | ICD-10-CM | POA: Diagnosis present

## 2022-09-19 DIAGNOSIS — M542 Cervicalgia: Secondary | ICD-10-CM | POA: Diagnosis not present

## 2022-09-19 NOTE — Therapy (Signed)
OUTPATIENT PHYSICAL THERAPY TREATMENT / PROGRESS NOTE Dates of reporting from 08/06/2022 to 09/19/2022   Patient Name: Carolyn Johnston MRN: [id number] DOB:1980/07/01, 42 y.o., female Today's Date: 09/19/2022  END OF SESSION:  PT End of Session - 09/19/22 1609     Visit Number 10    Number of Visits 13    Date for PT Re-Evaluation 10/29/22    Authorization Type [employer] PPO reporting from 08/06/2022    Authorization Time Period max combined 60 PT/OT/SLP per year    Authorization - Number of Visits 8    Progress Note Due on Visit 60    PT Start Time 1608    PT Stop Time 1638    PT Time Calculation (min) 30 min    Activity Tolerance Patient tolerated treatment well    Behavior During Therapy WFL for tasks assessed/performed              Past Medical History:  Diagnosis Date   Allergy    trees, dust mites   COVID-19    x2   Migraines    Past Surgical History:  Procedure Laterality Date   adnoids suture     DILATION AND CURETTAGE OF UTERUS     TONSILLECTOMY     WISDOM TOOTH EXTRACTION     Patient Active Problem List   Diagnosis Date Noted   Neck pain, chronic 07/27/2022   B12 deficiency 07/27/2022   Encounter for screening for HIV 07/27/2022   Need for hepatitis C screening test 07/27/2022   Abnormal glucose 07/27/2022   High serum cortisol 06/28/2021   Brittle nails 06/26/2021   Hair loss 03/24/2021   Birth control counseling 03/24/2021   Herpes 03/24/2021   Vitamin D insufficiency 06/06/2016   Recurrent occipital headache 04/22/2016   Bilateral occipital neuralgia 04/22/2016   Cervico-occipital neuralgia of right side 04/22/2016   Cervico-occipital neuralgia of left side 04/22/2016   Chronic pain syndrome 04/22/2016   Vestibular migraine 06/01/2013    PCP: Dana Allan, MD  REFERRING PROVIDER: Dana Allan, MD  REFERRING DIAG: neck pain, chronic  THERAPY DIAG:  Cervicalgia  Nonintractable headache, unspecified chronicity pattern, unspecified  headache type  Other muscle spasm  Rationale for Evaluation and Treatment: Rehabilitation  ONSET DATE: 10 years prior to PT evaluation.   PERTINENT HISTORY:  Patient is a 42 y.o. female who presents to outpatient physical therapy with a referral for medical diagnosis chronic neck pain. This patient's chief complaints consist of chronic neck pain and incompletely controlled migraine headaches leading to the following functional deficits: difficulty sleeping, moving head, working, driving, leisure activities. Relevant past medical history and comorbidities include Vestibular migraine; Recurrent occipital headache; Bilateral occipital neuralgia; Cervico-occipital neuralgia of right side; Cervico-occipital neuralgia of left side; Chronic pain syndrome; Vitamin D insufficiency; Hair loss; Herpes; Brittle nails; High serum cortisol; Neck pain, chronic; B12 deficiency; and Abnormal glucose, former smoker.  Patient denies hx of cancer, stroke, seizures, lung problems, heart problems, diabetes, unexplained weight loss, unexplained changes in bowel or bladder problems, unexplained stumbling or dropping things, osteoporosis, and spinal surgery.  SUBJECTIVE:  SUBJECTIVE STATEMENT: Patient states she is okay. She states she has had a lot of stress since last PT session. She does not have a headache today even though it rained earlier today. Patient reports she felt okay after last PT session.   PAIN:  Are you having pain?  NPRS scale: 2/10 left lower cervical spine  PRECAUTIONS: None  PATIENT GOALS: "to see how it can improve"   OBJECTIVE  SELF-REPORTED FUNCTION FOTO score: 70/100 (neck questionnaire)  SPINE MOTION CERVICAL SPINE AROM *Indicates pain Flexion: 70  Extension: 70 increased pain at base  of neck Side Flexion:       R 50 contralateral tension      L 48 pain at contralateral neck Rotation:  R 74 L 74  Protraction: ROM WFL Retraction: 1 inches.   TODAY'S TREATMENT:  Therapeutic exercise: to centralize symptoms and improve ROM, strength, muscular endurance, and activity tolerance required for successful completion of functional activities.  - Upper body ergometer at level 7 to encourage joint nutrition, warm tissue, induce analgesic effect of aerobic exercise, improve muscular strength and endurance,  and prepare for remainder of session. 5 minutes.  - measurements to assess progress (see above) - prone cervical spine retraction hold with UE "W" with scapular retraction, 3x10  Pt required multimodal cuing for proper technique and to facilitate improved neuromuscular control, strength, range of motion, and functional ability resulting in improved performance and form.  PATIENT EDUCATION:  Education details: Exercise purpose/form. Self management techniques.  Person educated: Patient Education method: Explanation, Demonstration, Tactile cues, Verbal cues, and Handouts Education comprehension: verbalized understanding, returned demonstration, and needs further education  HOME EXERCISE PROGRAM: Access Code: [email or web address] URL: [email or web address] Date: 09/17/2022 Prepared by: Norton Blizzard  Exercises - Prone W Scapular Retraction  - 3-5 x weekly - 3 sets - 10 reps - Seated Shoulder Diagonal with Resistance  - 3-5 x weekly - 3 sets - 10 reps - Standing Shoulder Horizontal Abduction with Resistance  - 3-5 x weekly - 3 sets - 10 reps  HOME EXERCISE PROGRAM [[id number]] View at "[email or web address]" using code: [email or web address]  Thoracic Extension  -  Repeat 10 Repetitions, Hold 3 Seconds, Complete 1 Set, Perform 1 Times a Day      ASSESSMENT:  CLINICAL IMPRESSION: Patient has attended 10 physical therapy sessions since starting current episode of care on 08/06/2022.  Patient has made good progress towards all of her goals and has met all of them except improved AROM towards L cervical spine side-bending and has not yet received final HEP.  She especially felt better today and would benefit from an additional 2-3 visits to ensure stability to her improvements and establish final long term HEP. Plan to continue for 2-3 more visits as needed and discharge to long term HEP. Patient would benefit from continued management of limiting condition by skilled physical therapist to address remaining impairments and functional limitations to work towards stated goals and return to PLOF or maximal functional independence.     OBJECTIVE IMPAIRMENTS: decreased activity tolerance, decreased coordination, decreased endurance, decreased knowledge of condition, decreased ROM, decreased strength, impaired perceived functional ability, increased muscle spasms, impaired flexibility, and pain.   ACTIVITY LIMITATIONS: carrying, lifting, bending, and sleeping  PARTICIPATION LIMITATIONS: occupation and   difficulty sleeping, moving head, working, driving, leisure activities  PERSONAL FACTORS: Fitness, Past/current experiences, Time since onset of injury/illness/exacerbation, and 3+ comorbidities:   Vestibular migraine; Recurrent occipital headache; Bilateral occipital neuralgia; Cervico-occipital neuralgia of right side; Cervico-occipital neuralgia of left side; Chronic  pain syndrome; Vitamin D insufficiency; Hair loss; Herpes; Brittle nails; High serum cortisol; Neck pain, chronic; B12 deficiency; and Abnormal glucose, former smoker are also affecting patient's functional outcome.   REHAB POTENTIAL: Good  CLINICAL DECISION MAKING: Stable/uncomplicated  EVALUATION COMPLEXITY: Low   GOALS: Goals reviewed with patient? No  SHORT TERM GOALS: Target date: 08/20/2022  Patient will be independent with initial home exercise program for self-management of symptoms. Baseline: Initial HEP  provided at IE (08/06/22); Goal status: MET   LONG TERM GOALS: Target date: 10/29/2022  Patient will be independent with a long-term home exercise program for self-management of symptoms.  Baseline: Initial HEP provided at IE (08/06/22); currently participating well (09/19/2022);  Goal status: In-progress  2.  Patient will demonstrate improved FOTO to equal or greater than 67 by visit #10 to demonstrate improvement in overall condition and self-reported functional ability.  Baseline: 57 (08/06/22); 68 at visit #5 (09/03/2022); 70 at visit #10 (09/19/2022);  Goal status: In-progress  3.  Patient will report neck pain with functional activity at equal or less than 3/10 to improve her ability to sleep, complete work activities, and move her head with less difficulty.  Baseline: up to 9/10 (08/06/22); up to 3/10 in the last 2 weeks (09/19/2022);  Goal status: In-progress  4.  Patient will demonstrate increase in cervical spine ROM by 5 degrees in rotation, flexion, and extension with no increase in pain except intermittent end range discomfort to improve her ability to move her head while completing daily activities.  Baseline: see objective  (08/06/22); met except left side bending - see objective (09/19/2022);  Goal status: In-progress  5.  Patient will complete community, work and/or recreational activities with 50% less  limitation due to current condition.  Baseline: difficulty sleeping, moving head, working, driving, leisure activities (08/06/22); estimates 90% improvement (09/19/2022);  Goal status: In-progress   PLAN:  PT FREQUENCY: 1-2x/week  PT DURATION: 12 weeks  PLANNED INTERVENTIONS: Therapeutic exercises, Therapeutic activity, Neuromuscular re-education, Patient/Family education, Self Care, Joint mobilization, Aquatic Therapy, Dry Needling, Electrical stimulation, Spinal mobilization, Cryotherapy, Moist heat, Manual therapy, and Re-evaluation  PLAN FOR NEXT SESSION: update HEP as  appropriate, progressive postural/UE/cervical spine/functional strengthening, education, manual therapy and dry needling as needed.    Cira Rue, PT, DPT 09/19/2022, 7:57 PM  [facility] [address] [place], [place] [address] P: [phone] I F: [phone]

## 2022-09-24 ENCOUNTER — Encounter: Payer: Self-pay | Admitting: Physical Therapy

## 2022-09-24 ENCOUNTER — Ambulatory Visit: Payer: Commercial Managed Care - PPO | Admitting: Physical Therapy

## 2022-09-24 DIAGNOSIS — R519 Headache, unspecified: Secondary | ICD-10-CM

## 2022-09-24 DIAGNOSIS — M542 Cervicalgia: Secondary | ICD-10-CM | POA: Diagnosis not present

## 2022-09-24 DIAGNOSIS — M62838 Other muscle spasm: Secondary | ICD-10-CM

## 2022-09-24 NOTE — Therapy (Signed)
OUTPATIENT PHYSICAL THERAPY TREATMENT NOTE   Patient Name: Carolyn Johnston MRN: [id number] DOB:1980/09/02, 42 y.o., female Today's Date: 09/24/2022  END OF SESSION:  PT End of Session - 09/24/22 1605     Visit Number 11    Number of Visits 13    Date for PT Re-Evaluation 10/29/22    Authorization Type [employer] PPO reporting from 09/19/2022    Authorization Time Period max combined 60 PT/OT/SLP per year    Authorization - Number of Visits 9    Progress Note Due on Visit 60    PT Start Time 1605    PT Stop Time 1643    PT Time Calculation (min) 38 min    Activity Tolerance Patient tolerated treatment well    Behavior During Therapy WFL for tasks assessed/performed               Past Medical History:  Diagnosis Date   Allergy    trees, dust mites   COVID-19    x2   Migraines    Past Surgical History:  Procedure Laterality Date   adnoids suture     DILATION AND CURETTAGE OF UTERUS     TONSILLECTOMY     WISDOM TOOTH EXTRACTION     Patient Active Problem List   Diagnosis Date Noted   Neck pain, chronic 07/27/2022   B12 deficiency 07/27/2022   Encounter for screening for HIV 07/27/2022   Need for hepatitis C screening test 07/27/2022   Abnormal glucose 07/27/2022   High serum cortisol 06/28/2021   Brittle nails 06/26/2021   Hair loss 03/24/2021   Birth control counseling 03/24/2021   Herpes 03/24/2021   Vitamin D insufficiency 06/06/2016   Recurrent occipital headache 04/22/2016   Bilateral occipital neuralgia 04/22/2016   Cervico-occipital neuralgia of right side 04/22/2016   Cervico-occipital neuralgia of left side 04/22/2016   Chronic pain syndrome 04/22/2016   Vestibular migraine 06/01/2013    PCP: Dana Allan, MD  REFERRING PROVIDER: Dana Allan, MD  REFERRING DIAG: neck pain, chronic  THERAPY DIAG:  Cervicalgia  Nonintractable headache, unspecified chronicity pattern, unspecified headache type  Other muscle spasm  Rationale for Evaluation  and Treatment: Rehabilitation  ONSET DATE: 10 years prior to PT evaluation.   PERTINENT HISTORY:  Patient is a 42 y.o. female who presents to outpatient physical therapy with a referral for medical diagnosis chronic neck pain. This patient's chief complaints consist of chronic neck pain and incompletely controlled migraine headaches leading to the following functional deficits: difficulty sleeping, moving head, working, driving, leisure activities. Relevant past medical history and comorbidities include Vestibular migraine; Recurrent occipital headache; Bilateral occipital neuralgia; Cervico-occipital neuralgia of right side; Cervico-occipital neuralgia of left side; Chronic pain syndrome; Vitamin D insufficiency; Hair loss; Herpes; Brittle nails; High serum cortisol; Neck pain, chronic; B12 deficiency; and Abnormal glucose, former smoker.  Patient denies hx of cancer, stroke, seizures, lung problems, heart problems, diabetes, unexplained weight loss, unexplained changes in bowel or bladder problems, unexplained stumbling or dropping things, osteoporosis, and spinal surgery.  SUBJECTIVE:  SUBJECTIVE STATEMENT: Patient states neck is not feeling good today. She had a headache yesterday but today is okay. The day after her last PT session, her neck hurt more again and she felt like she has a "crick" in her neck again. It was hurting again to turn to the left and she has pain over the CT junction, especially when looking up .  PAIN:  NPRS scale: 3/10 lower cervical spine when extending neck, 2/10 at left base on neck when turning left.   PRECAUTIONS: None  PATIENT GOALS: "to see how it can improve"   OBJECTIVE   TODAY'S TREATMENT:  Therapeutic exercise: to centralize symptoms and improve ROM,  strength, muscular endurance, and activity tolerance required for successful completion of functional activities.  - Upper body ergometer at level 7 to encourage joint nutrition, warm tissue, induce analgesic effect of aerobic exercise, improve muscular strength and endurance,  and prepare for remainder of session. 5 minutes.  - seated lat pull down, 3x10 at 25# - seated scapular rows, 3x10 at 15# - standing scapular setting at wall with 5#DB, 3x10 - standing scapular retraction with B ER against BlackTB, 3x10 - standing bilateral horizontal abduction with scapular retraction using BlackTB, 3x10 - prone cervical spine retraction, 1x10 with 5 second hold - prone cervical spine retraction hold with UE "W" with scapular retraction, 3x10 - standing bilateral D2 flexion/extension pull apart, 3x10 each side with BlackTB - seated overhead press, 3x10 with 5#DB - seated left sidebend stretch, 3x30 seconds.   Pt required multimodal cuing for proper technique and to facilitate improved neuromuscular control, strength, range of motion, and functional ability resulting in improved performance and form.  PATIENT EDUCATION:  Education details: Exercise purpose/form. Self management techniques.  Person educated: Patient Education method: Explanation, Demonstration, Tactile cues, Verbal cues, and Handouts Education comprehension: verbalized understanding, returned demonstration, and needs further education  HOME EXERCISE PROGRAM: Access Code: [email or web address] URL: [email or web address] Date: 09/17/2022 Prepared by: Norton Blizzard  Exercises - Prone W Scapular Retraction  - 3-5 x weekly - 3 sets - 10 reps - Seated Shoulder Diagonal with Resistance  - 3-5 x weekly - 3 sets - 10 reps - Standing Shoulder Horizontal Abduction with Resistance  - 3-5 x weekly - 3 sets - 10 reps  HOME EXERCISE PROGRAM [[id number]] View at "[email or web address]" using code: [email or web address]  Thoracic Extension  -  Repeat 10  Repetitions, Hold 3 Seconds, Complete 1 Set, Perform 1 Times a Day      ASSESSMENT:  CLINICAL IMPRESSION: Patient arrives reporting return of pain after brief period of improved pain including at last PT session. Today's session returned to exercises completed prior to improved pain to continue with postural strengthening and return to improved pain status. Patient completed exercises without increased pain, and was able to progress intensity slightly on the exercises. She had no change in concordant symptoms by the end of session. Patient would benefit from continued management of limiting condition by skilled physical therapist to address remaining impairments and functional limitations to work towards stated goals and return to PLOF or maximal functional independence.   OBJECTIVE IMPAIRMENTS: decreased activity tolerance, decreased coordination, decreased endurance, decreased knowledge of condition, decreased ROM, decreased strength, impaired perceived functional ability, increased muscle spasms, impaired flexibility, and pain.   ACTIVITY LIMITATIONS: carrying, lifting, bending, and sleeping  PARTICIPATION LIMITATIONS: occupation and   difficulty sleeping, moving head, working, driving, leisure activities  PERSONAL FACTORS: Fitness, Past/current experiences, Time since onset of injury/illness/exacerbation, and 3+ comorbidities:   Vestibular  migraine; Recurrent occipital headache; Bilateral occipital neuralgia; Cervico-occipital neuralgia of right side; Cervico-occipital neuralgia of left side; Chronic pain syndrome; Vitamin D insufficiency; Hair loss; Herpes; Brittle nails; High serum cortisol; Neck pain, chronic; B12 deficiency; and Abnormal glucose, former smoker are also affecting patient's functional outcome.   REHAB POTENTIAL: Good  CLINICAL DECISION MAKING: Stable/uncomplicated  EVALUATION COMPLEXITY: Low   GOALS: Goals reviewed with patient? No  SHORT TERM GOALS: Target date:  08/20/2022  Patient will be independent with initial home exercise program for self-management of symptoms. Baseline: Initial HEP provided at IE (08/06/22); Goal status: MET   LONG TERM GOALS: Target date: 10/29/2022  Patient will be independent with a long-term home exercise program for self-management of symptoms.  Baseline: Initial HEP provided at IE (08/06/22); currently participating well (09/19/2022);  Goal status: In-progress  2.  Patient will demonstrate improved FOTO to equal or greater than 67 by visit #10 to demonstrate improvement in overall condition and self-reported functional ability.  Baseline: 57 (08/06/22); 68 at visit #5 (09/03/2022); 70 at visit #10 (09/19/2022);  Goal status: In-progress  3.  Patient will report neck pain with functional activity at equal or less than 3/10 to improve her ability to sleep, complete work activities, and move her head with less difficulty.  Baseline: up to 9/10 (08/06/22); up to 3/10 in the last 2 weeks (09/19/2022);  Goal status: In-progress  4.  Patient will demonstrate increase in cervical spine ROM by 5 degrees in rotation, flexion, and extension with no increase in pain except intermittent end range discomfort to improve her ability to move her head while completing daily activities.  Baseline: see objective  (08/06/22); met except left side bending - see objective (09/19/2022);  Goal status: In-progress  5.  Patient will complete community, work and/or recreational activities with 50% less  limitation due to current condition.  Baseline: difficulty sleeping, moving head, working, driving, leisure activities (08/06/22); estimates 90% improvement (09/19/2022);  Goal status: In-progress   PLAN:  PT FREQUENCY: 1-2x/week  PT DURATION: 12 weeks  PLANNED INTERVENTIONS: Therapeutic exercises, Therapeutic activity, Neuromuscular re-education, Patient/Family education, Self Care, Joint mobilization, Aquatic Therapy, Dry Needling, Electrical  stimulation, Spinal mobilization, Cryotherapy, Moist heat, Manual therapy, and Re-evaluation  PLAN FOR NEXT SESSION: update HEP as appropriate, progressive postural/UE/cervical spine/functional strengthening, education, manual therapy and dry needling as needed.    Cira Rue, PT, DPT 09/24/2022, 4:48 PM  [facility] [address] [place], [place] [address] P: [phone] I F: [phone]

## 2022-09-26 ENCOUNTER — Encounter: Payer: Self-pay | Admitting: Physical Therapy

## 2022-09-26 ENCOUNTER — Ambulatory Visit: Payer: Commercial Managed Care - PPO | Admitting: Physical Therapy

## 2022-09-26 DIAGNOSIS — M62838 Other muscle spasm: Secondary | ICD-10-CM

## 2022-09-26 DIAGNOSIS — R519 Headache, unspecified: Secondary | ICD-10-CM

## 2022-09-26 DIAGNOSIS — M542 Cervicalgia: Secondary | ICD-10-CM

## 2022-09-26 NOTE — Therapy (Signed)
OUTPATIENT PHYSICAL THERAPY TREATMENT   Patient Name: Carolyn Johnston MRN: [id number] DOB:1981/01/14, 42 y.o., female Today's Date: 09/26/2022  END OF SESSION:  PT End of Session - 09/26/22 1554     Visit Number 12    Number of Visits 13    Date for PT Re-Evaluation 10/29/22    Authorization Type [employer] PPO reporting from 09/19/2022    Authorization Time Period max combined 60 PT/OT/SLP per year    Authorization - Number of Visits 10    Progress Note Due on Visit 60    PT Start Time 1552    PT Stop Time 1632    PT Time Calculation (min) 40 min    Activity Tolerance Patient tolerated treatment well    Behavior During Therapy WFL for tasks assessed/performed                Past Medical History:  Diagnosis Date   Allergy    trees, dust mites   COVID-19    x2   Migraines    Past Surgical History:  Procedure Laterality Date   adnoids suture     DILATION AND CURETTAGE OF UTERUS     TONSILLECTOMY     WISDOM TOOTH EXTRACTION     Patient Active Problem List   Diagnosis Date Noted   Neck pain, chronic 07/27/2022   B12 deficiency 07/27/2022   Encounter for screening for HIV 07/27/2022   Need for hepatitis C screening test 07/27/2022   Abnormal glucose 07/27/2022   High serum cortisol 06/28/2021   Brittle nails 06/26/2021   Hair loss 03/24/2021   Birth control counseling 03/24/2021   Herpes 03/24/2021   Vitamin D insufficiency 06/06/2016   Recurrent occipital headache 04/22/2016   Bilateral occipital neuralgia 04/22/2016   Cervico-occipital neuralgia of right side 04/22/2016   Cervico-occipital neuralgia of left side 04/22/2016   Chronic pain syndrome 04/22/2016   Vestibular migraine 06/01/2013    PCP: Dana Allan, MD  REFERRING PROVIDER: Dana Allan, MD  REFERRING DIAG: neck pain, chronic  THERAPY DIAG:  Cervicalgia  Nonintractable headache, unspecified chronicity pattern, unspecified headache type  Other muscle spasm  Rationale for Evaluation  and Treatment: Rehabilitation  ONSET DATE: 10 years prior to PT evaluation.   PERTINENT HISTORY:  Patient is a 42 y.o. female who presents to outpatient physical therapy with a referral for medical diagnosis chronic neck pain. This patient's chief complaints consist of chronic neck pain and incompletely controlled migraine headaches leading to the following functional deficits: difficulty sleeping, moving head, working, driving, leisure activities. Relevant past medical history and comorbidities include Vestibular migraine; Recurrent occipital headache; Bilateral occipital neuralgia; Cervico-occipital neuralgia of right side; Cervico-occipital neuralgia of left side; Chronic pain syndrome; Vitamin D insufficiency; Hair loss; Herpes; Brittle nails; High serum cortisol; Neck pain, chronic; B12 deficiency; and Abnormal glucose, former smoker.  Patient denies hx of cancer, stroke, seizures, lung problems, heart problems, diabetes, unexplained weight loss, unexplained changes in bowel or bladder problems, unexplained stumbling or dropping things, osteoporosis, and spinal surgery.  SUBJECTIVE:  SUBJECTIVE STATEMENT: Patient states her neck was really bothering her at night last night. She states she felt no worse after last PT session. She states she thinks the rain is bothering her neck. She felt like she was going to get a migraine earlier today but it went away.   PAIN:  NPRS scale: 3.5/10 lower cervical spine when extending neck, 2/10 at left base on neck when turning left.   PRECAUTIONS: None  PATIENT GOALS: "to see how it can improve"   OBJECTIVE  TODAY'S TREATMENT:  Therapeutic exercise: to centralize symptoms and improve ROM, strength, muscular endurance, and activity tolerance required for  successful completion of functional activities.  - Upper body ergometer at level 7 to encourage joint nutrition, warm tissue, induce analgesic effect of aerobic exercise, improve muscular strength and endurance,  and prepare for remainder of session. 5 minutes.  - seated thoracic extension over towel roll in back of chair, with one leg crossed in figure 4 position and bottom foot on stool, hands clasped behind neck blocking neck extension with forearms under chin. 1x10 with 5 second holds.  - kneeling prayer stretch for upper thoracic spine. 1x10 with 5 second hold. Airex pad to kneel on; elevated plinth for elbows. With stick 1x10 with 5 second holds.  (Mildly improved neck extension, left rotation less painful). - seated repeated cervical retraction with self overpressure, 1x5 - standing repeated cervical retraction with self overpressure at wall with towel roll behind spine, 1x5 - Education on HEP including handout     Manual therapy: to reduce pain and tissue tension, improve range of motion, neuromodulation, in order to promote improved ability to complete functional activities. SEATED - MDT cervical retraction with clinician OP, 4x6 (improving pain and extension ROM).   Pt required multimodal cuing for proper technique and to facilitate improved neuromuscular control, strength, range of motion, and functional ability resulting in improved performance and form.  PATIENT EDUCATION:  Education details: Exercise purpose/form. Self management techniques.  Person educated: Patient Education method: Explanation, Demonstration, Tactile cues, Verbal cues, and Handouts Education comprehension: verbalized understanding, returned demonstration, and needs further education  HOME EXERCISE PROGRAM: Access Code: [email or web address] URL: [email or web address] Date: 09/17/2022 Prepared by: Norton Blizzard  Exercises - Prone W Scapular Retraction  - 3-5 x weekly - 3 sets - 10 reps - Seated Shoulder  Diagonal with Resistance  - 3-5 x weekly - 3 sets - 10 reps - Standing Shoulder Horizontal Abduction with Resistance  - 3-5 x weekly - 3 sets - 10 reps  HOME EXERCISE PROGRAM [HQU9RUG] View at "[email or web address]" using code: [email or web address] Cervical Retraction in Sitting w/OP - MDT -  Repeat 20 Repetitions, Hold 1 Second(s), Complete 1 Set, Perform 8 Times a Day  Prayer Stretch -  Repeat 20 Repetitions, Perform 1 Times a Day    HOME EXERCISE PROGRAM [[email or web address]] View at "[email or web address]" using code: [email or web address]  Thoracic Extension  -  Repeat 10 Repetitions, Hold 3 Seconds, Complete 1 Set, Perform 1 Times a Day      ASSESSMENT:  CLINICAL IMPRESSION: Patient arrives reporting continued pain at base of neck with cervical spine extension. Today's session focused on stretching upper thoracic and lower cervical extension with upper cervical flexion. She responded best to repeated cervical retraction with clinician OP with significant improvement by end of session. HEP updated to focus on repeated retraction with self overpressure until next session. Patient would benefit from continued management of limiting condition by skilled physical therapist to address remaining impairments and functional limitations to  work towards stated goals and return to PLOF or maximal functional independence.    OBJECTIVE IMPAIRMENTS: decreased activity tolerance, decreased coordination, decreased endurance, decreased knowledge of condition, decreased ROM, decreased strength, impaired perceived functional ability, increased muscle spasms, impaired flexibility, and pain.   ACTIVITY LIMITATIONS: carrying, lifting, bending, and sleeping  PARTICIPATION LIMITATIONS: occupation and   difficulty sleeping, moving head, working, driving, leisure activities  PERSONAL FACTORS: Fitness, Past/current experiences, Time since onset of injury/illness/exacerbation, and 3+ comorbidities:   Vestibular migraine; Recurrent occipital  headache; Bilateral occipital neuralgia; Cervico-occipital neuralgia of right side; Cervico-occipital neuralgia of left side; Chronic pain syndrome; Vitamin D insufficiency; Hair loss; Herpes; Brittle nails; High serum cortisol; Neck pain, chronic; B12 deficiency; and Abnormal glucose, former smoker are also affecting patient's functional outcome.   REHAB POTENTIAL: Good  CLINICAL DECISION MAKING: Stable/uncomplicated  EVALUATION COMPLEXITY: Low   GOALS: Goals reviewed with patient? No  SHORT TERM GOALS: Target date: 08/20/2022  Patient will be independent with initial home exercise program for self-management of symptoms. Baseline: Initial HEP provided at IE (08/06/22); Goal status: MET   LONG TERM GOALS: Target date: 10/29/2022  Patient will be independent with a long-term home exercise program for self-management of symptoms.  Baseline: Initial HEP provided at IE (08/06/22); currently participating well (09/19/2022);  Goal status: In-progress  2.  Patient will demonstrate improved FOTO to equal or greater than 67 by visit #10 to demonstrate improvement in overall condition and self-reported functional ability.  Baseline: 57 (08/06/22); 68 at visit #5 (09/03/2022); 70 at visit #10 (09/19/2022);  Goal status: In-progress  3.  Patient will report neck pain with functional activity at equal or less than 3/10 to improve her ability to sleep, complete work activities, and move her head with less difficulty.  Baseline: up to 9/10 (08/06/22); up to 3/10 in the last 2 weeks (09/19/2022);  Goal status: In-progress  4.  Patient will demonstrate increase in cervical spine ROM by 5 degrees in rotation, flexion, and extension with no increase in pain except intermittent end range discomfort to improve her ability to move her head while completing daily activities.  Baseline: see objective  (08/06/22); met except left side bending - see objective (09/19/2022);  Goal status: In-progress  5.  Patient will  complete community, work and/or recreational activities with 50% less  limitation due to current condition.  Baseline: difficulty sleeping, moving head, working, driving, leisure activities (08/06/22); estimates 90% improvement (09/19/2022);  Goal status: In-progress   PLAN:  PT FREQUENCY: 1-2x/week  PT DURATION: 12 weeks  PLANNED INTERVENTIONS: Therapeutic exercises, Therapeutic activity, Neuromuscular re-education, Patient/Family education, Self Care, Joint mobilization, Aquatic Therapy, Dry Needling, Electrical stimulation, Spinal mobilization, Cryotherapy, Moist heat, Manual therapy, and Re-evaluation  PLAN FOR NEXT SESSION: update HEP as appropriate, progressive postural/UE/cervical spine/functional strengthening, education, manual therapy and dry needling as needed.    Cira Rue, PT, DPT 09/26/2022, 4:47 PM  [facility] [address] [place], [place] [address] P: [phone] I F: [phone]

## 2022-10-01 ENCOUNTER — Other Ambulatory Visit: Payer: Self-pay | Admitting: Obstetrics and Gynecology

## 2022-10-01 ENCOUNTER — Ambulatory Visit: Payer: Commercial Managed Care - PPO | Admitting: Physical Therapy

## 2022-10-01 ENCOUNTER — Encounter: Payer: Self-pay | Admitting: Physical Therapy

## 2022-10-01 DIAGNOSIS — B009 Herpesviral infection, unspecified: Secondary | ICD-10-CM

## 2022-10-01 DIAGNOSIS — M542 Cervicalgia: Secondary | ICD-10-CM | POA: Diagnosis not present

## 2022-10-01 DIAGNOSIS — M62838 Other muscle spasm: Secondary | ICD-10-CM

## 2022-10-01 DIAGNOSIS — R519 Headache, unspecified: Secondary | ICD-10-CM

## 2022-10-01 NOTE — Therapy (Signed)
OUTPATIENT PHYSICAL THERAPY TREATMENT   Patient Name: Carolyn Johnston MRN: [id number] DOB:[birth year], 42 y.o., female Today's Date: 10/01/2022  END OF SESSION:  PT End of Session - 10/01/22 1604     Visit Number 13    Number of Visits 23    Date for PT Re-Evaluation 10/29/22    Authorization Type [employer] PPO reporting from 09/19/2022    Authorization Time Period max combined 60 PT/OT/SLP per year    Authorization - Number of Visits 20    Progress Note Due on Visit 60    PT Start Time 1603    PT Stop Time 1641    PT Time Calculation (min) 38 min    Activity Tolerance Patient tolerated treatment well    Behavior During Therapy WFL for tasks assessed/performed                 Past Medical History:  Diagnosis Date   Allergy    trees, dust mites   COVID-19    x2   Migraines    Past Surgical History:  Procedure Laterality Date   adnoids suture     DILATION AND CURETTAGE OF UTERUS     TONSILLECTOMY     WISDOM TOOTH EXTRACTION     Patient Active Problem List   Diagnosis Date Noted   Neck pain, chronic 07/27/2022   B12 deficiency 07/27/2022   Encounter for screening for HIV 07/27/2022   Need for hepatitis C screening test 07/27/2022   Abnormal glucose 07/27/2022   High serum cortisol 06/28/2021   Brittle nails 06/26/2021   Hair loss 03/24/2021   Birth control counseling 03/24/2021   Herpes 03/24/2021   Vitamin D insufficiency 06/06/2016   Recurrent occipital headache 04/22/2016   Bilateral occipital neuralgia 04/22/2016   Cervico-occipital neuralgia of right side 04/22/2016   Cervico-occipital neuralgia of left side 04/22/2016   Chronic pain syndrome 04/22/2016   Vestibular migraine 06/01/2013    PCP: Dana Allan, MD  REFERRING PROVIDER: Dana Allan, MD  REFERRING DIAG: neck pain, chronic  THERAPY DIAG:  Cervicalgia  Nonintractable headache, unspecified chronicity pattern, unspecified headache type  Other muscle spasm  Rationale for Evaluation  and Treatment: Rehabilitation  ONSET DATE: 10 years prior to PT evaluation.   PERTINENT HISTORY:  Patient is a 42 y.o. female who presents to outpatient physical therapy with a referral for medical diagnosis chronic neck pain. This patient's chief complaints consist of chronic neck pain and incompletely controlled migraine headaches leading to the following functional deficits: difficulty sleeping, moving head, working, driving, leisure activities. Relevant past medical history and comorbidities include Vestibular migraine; Recurrent occipital headache; Bilateral occipital neuralgia; Cervico-occipital neuralgia of right side; Cervico-occipital neuralgia of left side; Chronic pain syndrome; Vitamin D insufficiency; Hair loss; Herpes; Brittle nails; High serum cortisol; Neck pain, chronic; B12 deficiency; and Abnormal glucose, former smoker.  Patient denies hx of cancer, stroke, seizures, lung problems, heart problems, diabetes, unexplained weight loss, unexplained changes in bowel or bladder problems, unexplained stumbling or dropping things, osteoporosis, and spinal surgery.  SUBJECTIVE:  SUBJECTIVE STATEMENT: Patient states she was feeling okay after last PT session on Thursday. She tried to do more cervical retractions. She felt good on Saturday, but on Sunday she had a lot of pain. She went dancing on Saturday which included having her arms up at shoulder level. Her neck still hurts today but not as much as Sunday.   PAIN:  NPRS scale: 3.5-4/10 lower cervical spine when extending neck.  PRECAUTIONS: None  PATIENT GOALS: "to see how it can improve"   OBJECTIVE  TODAY'S TREATMENT:  Therapeutic exercise: to centralize symptoms and improve ROM, strength, muscular endurance, and activity tolerance  required for successful completion of functional activities.  - Upper body ergometer at level 7 to encourage joint nutrition, warm tissue, induce analgesic effect of aerobic exercise, improve muscular strength and endurance,  and prepare for remainder of session. 5 minutes.  - seated repeated cervical retraction with self overpressure, 1x10 - seated cervical spine ROM checks between bouts of manual therapy to help determine effectiveness (First 6 bouts of retraction with clinician overpressure) - seated repeated cervical retraction with self overpressure to extension, 1x10 (painful, only going to edge of pain, no improvement afterwards).  - patient educated on [employer] [place]  Guide to Movement Safe Pain   Manual therapy: to reduce pain and tissue tension, improve range of motion, neuromodulation, in order to promote improved ability to complete functional activities. SEATED - MDT cervical retraction with clinician OP, 8x6 (improving pain and extension ROM).   Pt required multimodal cuing for proper technique and to facilitate improved neuromuscular control, strength, range of motion, and functional ability resulting in improved performance and form.  PATIENT EDUCATION:  Education details: Exercise purpose/form. Self management techniques.  Person educated: Patient Education method: Explanation, Demonstration, Tactile cues, Verbal cues, and Handouts Education comprehension: verbalized understanding, returned demonstration, and needs further education  HOME EXERCISE PROGRAM: Access Code: [email or web address] URL: [email or web address] Date: 09/17/2022 Prepared by: Norton Blizzard  Exercises - Prone W Scapular Retraction  - 3-5 x weekly - 3 sets - 10 reps - Seated Shoulder Diagonal with Resistance  - 3-5 x weekly - 3 sets - 10 reps - Standing Shoulder Horizontal Abduction with Resistance  - 3-5 x weekly - 3 sets - 10 reps  HOME EXERCISE PROGRAM [HQU9RUG] View at "[email or web address]" using  code: [email or web address] Cervical Retraction in Sitting w/OP - MDT -  Repeat 20 Repetitions, Hold 1 Second(s), Complete 1 Set, Perform 8 Times a Day  Prayer Stretch -  Repeat 20 Repetitions, Perform 1 Times a Day    HOME EXERCISE PROGRAM [[email or web address]] View at "[email or web address]" using code: [email or web address]  Thoracic Extension  -  Repeat 10 Repetitions, Hold 3 Seconds, Complete 1 Set, Perform 1 Times a Day      ASSESSMENT:  CLINICAL IMPRESSION: Patient arrives frustrated with continued neck pain. Subjective exam reveals improved pain until after Saturday when she did a lot of dancing that might have effected her pain. Pain was improved again with repeated retraction, but required clinician overpressure and patient was unable to successfully reach retraction with extension. More bouts of clinician OP were utilized to help it be more long lasting and patient encouraged to continue with regular bouts of cervical retraction with self overpressure. Patient would benefit from continued management of limiting condition by skilled physical therapist to address remaining impairments and functional limitations to work towards stated goals and return to PLOF or maximal functional independence.   OBJECTIVE IMPAIRMENTS: decreased activity tolerance, decreased coordination, decreased endurance, decreased knowledge of condition,  decreased ROM, decreased strength, impaired perceived functional ability, increased muscle spasms, impaired flexibility, and pain.   ACTIVITY LIMITATIONS: carrying, lifting, bending, and sleeping  PARTICIPATION LIMITATIONS: occupation and   difficulty sleeping, moving head, working, driving, leisure activities  PERSONAL FACTORS: Fitness, Past/current experiences, Time since onset of injury/illness/exacerbation, and 3+ comorbidities:   Vestibular migraine; Recurrent occipital headache; Bilateral occipital neuralgia; Cervico-occipital neuralgia of right side; Cervico-occipital neuralgia of left side;  Chronic pain syndrome; Vitamin D insufficiency; Hair loss; Herpes; Brittle nails; High serum cortisol; Neck pain, chronic; B12 deficiency; and Abnormal glucose, former smoker are also affecting patient's functional outcome.   REHAB POTENTIAL: Good  CLINICAL DECISION MAKING: Stable/uncomplicated  EVALUATION COMPLEXITY: Low   GOALS: Goals reviewed with patient? No  SHORT TERM GOALS: Target date: 08/20/2022  Patient will be independent with initial home exercise program for self-management of symptoms. Baseline: Initial HEP provided at IE (08/06/22); Goal status: MET   LONG TERM GOALS: Target date: 10/29/2022  Patient will be independent with a long-term home exercise program for self-management of symptoms.  Baseline: Initial HEP provided at IE (08/06/22); currently participating well (09/19/2022);  Goal status: In-progress  2.  Patient will demonstrate improved FOTO to equal or greater than 67 by visit #10 to demonstrate improvement in overall condition and self-reported functional ability.  Baseline: 57 (08/06/22); 68 at visit #5 (09/03/2022); 70 at visit #10 (09/19/2022);  Goal status: In-progress  3.  Patient will report neck pain with functional activity at equal or less than 3/10 to improve her ability to sleep, complete work activities, and move her head with less difficulty.  Baseline: up to 9/10 (08/06/22); up to 3/10 in the last 2 weeks (09/19/2022);  Goal status: In-progress  4.  Patient will demonstrate increase in cervical spine ROM by 5 degrees in rotation, flexion, and extension with no increase in pain except intermittent end range discomfort to improve her ability to move her head while completing daily activities.  Baseline: see objective  (08/06/22); met except left side bending - see objective (09/19/2022);  Goal status: In-progress  5.  Patient will complete community, work and/or recreational activities with 50% less  limitation due to current condition.  Baseline:  difficulty sleeping, moving head, working, driving, leisure activities (08/06/22); estimates 90% improvement (09/19/2022);  Goal status: In-progress   PLAN:  PT FREQUENCY: 1-2x/week  PT DURATION: 12 weeks  PLANNED INTERVENTIONS: Therapeutic exercises, Therapeutic activity, Neuromuscular re-education, Patient/Family education, Self Care, Joint mobilization, Aquatic Therapy, Dry Needling, Electrical stimulation, Spinal mobilization, Cryotherapy, Moist heat, Manual therapy, and Re-evaluation  PLAN FOR NEXT SESSION: update HEP as appropriate, progressive postural/UE/cervical spine/functional strengthening, education, manual therapy and dry needling as needed.    Cira Rue, PT, DPT 10/01/2022, 7:37 PM  [facility] [address] [place], [place] [address] P: [phone] I F: [phone]

## 2022-10-08 ENCOUNTER — Ambulatory Visit: Payer: Commercial Managed Care - PPO | Admitting: Physical Therapy

## 2022-10-08 ENCOUNTER — Encounter: Payer: Self-pay | Admitting: Physical Therapy

## 2022-10-08 DIAGNOSIS — M542 Cervicalgia: Secondary | ICD-10-CM

## 2022-10-08 DIAGNOSIS — M62838 Other muscle spasm: Secondary | ICD-10-CM

## 2022-10-08 DIAGNOSIS — R519 Headache, unspecified: Secondary | ICD-10-CM

## 2022-10-08 NOTE — Therapy (Signed)
OUTPATIENT PHYSICAL THERAPY TREATMENT / DISCHARGE SUMMARY Dates of reporting from 08/06/2022 to 10/08/2022   Patient Name: Carolyn Johnston MRN: [id number] DOB:01/27/1981, 42 y.o., female Today's Date: 10/08/2022  END OF SESSION:  PT End of Session - 10/08/22 1647     Visit Number 14    Number of Visits 23    Date for PT Re-Evaluation 10/29/22    Authorization Type [employer] PPO reporting from 09/19/2022    Authorization Time Period max combined 60 PT/OT/SLP per year    Authorization - Number of Visits 20    Progress Note Due on Visit 60    PT Start Time 1647    PT Stop Time 1715    PT Time Calculation (min) 28 min    Activity Tolerance Patient tolerated treatment well    Behavior During Therapy WFL for tasks assessed/performed             Past Medical History:  Diagnosis Date   Allergy    trees, dust mites   COVID-19    x2   Migraines    Past Surgical History:  Procedure Laterality Date   adnoids suture     DILATION AND CURETTAGE OF UTERUS     TONSILLECTOMY     WISDOM TOOTH EXTRACTION     Patient Active Problem List   Diagnosis Date Noted   Neck pain, chronic 07/27/2022   B12 deficiency 07/27/2022   Encounter for screening for HIV 07/27/2022   Need for hepatitis C screening test 07/27/2022   Abnormal glucose 07/27/2022   High serum cortisol 06/28/2021   Brittle nails 06/26/2021   Hair loss 03/24/2021   Birth control counseling 03/24/2021   Herpes 03/24/2021   Vitamin D insufficiency 06/06/2016   Recurrent occipital headache 04/22/2016   Bilateral occipital neuralgia 04/22/2016   Cervico-occipital neuralgia of right side 04/22/2016   Cervico-occipital neuralgia of left side 04/22/2016   Chronic pain syndrome 04/22/2016   Vestibular migraine 06/01/2013    PCP: Dana Allan, MD  REFERRING PROVIDER: Dana Allan, MD  REFERRING DIAG: neck pain, chronic  THERAPY DIAG:  Cervicalgia  Nonintractable headache, unspecified chronicity pattern, unspecified  headache type  Other muscle spasm  Rationale for Evaluation and Treatment: Rehabilitation  ONSET DATE: 10 years prior to PT evaluation.   PERTINENT HISTORY:  Patient is a 42 y.o. female who presents to outpatient physical therapy with a referral for medical diagnosis chronic neck pain. This patient's chief complaints consist of chronic neck pain and incompletely controlled migraine headaches leading to the following functional deficits: difficulty sleeping, moving head, working, driving, leisure activities. Relevant past medical history and comorbidities include Vestibular migraine; Recurrent occipital headache; Bilateral occipital neuralgia; Cervico-occipital neuralgia of right side; Cervico-occipital neuralgia of left side; Chronic pain syndrome; Vitamin D insufficiency; Hair loss; Herpes; Brittle nails; High serum cortisol; Neck pain, chronic; B12 deficiency; and Abnormal glucose, former smoker.  Patient denies hx of cancer, stroke, seizures, lung problems, heart problems, diabetes, unexplained weight loss, unexplained changes in bowel or bladder problems, unexplained stumbling or dropping things, osteoporosis, and spinal surgery.  SUBJECTIVE:  SUBJECTIVE STATEMENT: Patient states she was really sore at her CT junction after last PT session. She has been doing her cervical retraction with overpressure regularly and that has been helping her neck. She can look up more easily now.  She has somewhat of a migraine today, but it is coming and going. She feels ready to discharge from PT.,   PAIN:  NPRS scale: 2/10 lower cervical spine when extending neck, and 1/10 when she turns her head left.   PRECAUTIONS: None  PATIENT GOALS: "to see how it can improve"   OBJECTIVE   SELF-REPORTED FUNCTION FOTO  score: 69/100 (neck questionnaire)   SPINE MOTION CERVICAL SPINE AROM *Indicates pain Flexion: 72  Extension: 70 increased pain at base of neck Side Flexion:       R 42 contralateral tension      L 40 contralateral tension greater than when bending right.  Rotation:  R 76 L 77 slight increase in left sided pain Protraction: ROM WFL  TODAY'S TREATMENT:  Therapeutic exercise: to centralize symptoms and improve ROM, strength, muscular endurance, and activity tolerance required for successful completion of functional activities.  - Upper body ergometer at level 7 to encourage joint nutrition, warm tissue, induce analgesic effect of aerobic exercise, improve muscular strength and endurance,  and prepare for remainder of session. 5 minutes.  - measurements to assess progress  - education on discharge recommendations.   PATIENT EDUCATION:  Education details: Exercise purpose/form. Self management techniques. Discharge recommendations.  Person educated: Patient Education method: Explanation, Demonstration Education comprehension: verbalized understanding and returned demonstration  HOME EXERCISE PROGRAM: Access Code: [email or web address] URL: [email or web address] Date: 09/17/2022 Prepared by: Norton Blizzard  Exercises - Prone W Scapular Retraction  - 3-5 x weekly - 3 sets - 10 reps - Seated Shoulder Diagonal with Resistance  - 3-5 x weekly - 3 sets - 10 reps - Standing Shoulder Horizontal Abduction with Resistance  - 3-5 x weekly - 3 sets - 10 reps  HOME EXERCISE PROGRAM [HQU9RUG] View at "[email or web address]" using code: [email or web address] Cervical Retraction in Sitting w/OP - MDT -  Repeat 20 Repetitions, Hold 1 Second(s), Complete 1 Set, Perform 8 Times a Day  Prayer Stretch -  Repeat 20 Repetitions, Perform 1 Times a Day    HOME EXERCISE PROGRAM [H2TQ9UU] View at "[email or web address]" using code: [email or web address]  Thoracic Extension  -  Repeat 10 Repetitions, Hold 3 Seconds, Complete 1  Set, Perform 1 Times a Day      ASSESSMENT:  CLINICAL IMPRESSION: Patient has completed 14 physical therapy sessions since starting current episode of care on 08/06/2022. She has made progress towards goals and met or nearly met most goals. Several treatment approaches were utilized during care, but she is getting the best response from repeated cervical retraction with self-overpressure and is encouraged to continue this as a long term HEP.   OBJECTIVE IMPAIRMENTS: decreased activity tolerance, decreased coordination, decreased endurance, decreased knowledge of condition, decreased ROM, decreased strength, impaired perceived functional ability, increased muscle spasms, impaired flexibility, and pain.   ACTIVITY LIMITATIONS: carrying, lifting, bending, and sleeping  PARTICIPATION LIMITATIONS: occupation and   difficulty sleeping, moving head, working, driving, leisure activities  PERSONAL FACTORS: Fitness, Past/current experiences, Time since onset of injury/illness/exacerbation, and 3+ comorbidities:   Vestibular migraine; Recurrent occipital headache; Bilateral occipital neuralgia; Cervico-occipital neuralgia of right side; Cervico-occipital neuralgia of left side; Chronic pain syndrome; Vitamin D insufficiency; Hair loss; Herpes; Brittle nails; High serum cortisol; Neck pain, chronic; B12 deficiency; and Abnormal glucose, former smoker are  also affecting patient's functional outcome.   REHAB POTENTIAL: Good  CLINICAL DECISION MAKING: Stable/uncomplicated  EVALUATION COMPLEXITY: Low   GOALS: Goals reviewed with patient? No  SHORT TERM GOALS: Target date: 08/20/2022  Patient will be independent with initial home exercise program for self-management of symptoms. Baseline: Initial HEP provided at IE (08/06/22); Goal status: MET   LONG TERM GOALS: Target date: 10/29/2022  Patient will be independent with a long-term home exercise program for self-management of symptoms.  Baseline:  Initial HEP provided at IE (08/06/22); currently participating well (09/19/2022); MET (10/08/2022);  Goal status: MET 2.  Patient will demonstrate improved FOTO to equal or greater than 67 by visit #10 to demonstrate improvement in overall condition and self-reported functional ability.  Baseline: 57 (08/06/22); 68 at visit #5 (09/03/2022); 70 at visit #10 (09/19/2022); 69 at visit #14 (10/08/2022);  Goal status: MET  3.  Patient will report neck pain with functional activity at equal or less than 3/10 to improve her ability to sleep, complete work activities, and move her head with less difficulty.  Baseline: up to 9/10 (08/06/22); up to 3/10 in the last 2 weeks (09/19/2022); up to 4/10 in the last two weeks (10/08/2022);  Goal status: Nearly met  4.  Patient will demonstrate increase in cervical spine ROM by 5 degrees in rotation, flexion, and extension with no increase in pain except intermittent end range discomfort to improve her ability to move her head while completing daily activities.  Baseline: see objective  (08/06/22); met except left side bending - see objective (09/19/2022); met except side bending - see objective (10/08/2022);  Goal status: Nearly met  5.  Patient will complete community, work and/or recreational activities with 50% less  limitation due to current condition.  Baseline: difficulty sleeping, moving head, working, driving, leisure activities (08/06/22); estimates 90% improvement (09/19/2022); "I can deal with now" estimates 80% improvement since starting PT (10/08/2022);  Goal status: MET   PLAN:  PT FREQUENCY: 1-2x/week  PT DURATION: 12 weeks  PLANNED INTERVENTIONS: Therapeutic exercises, Therapeutic activity, Neuromuscular re-education, Patient/Family education, Self Care, Joint mobilization, Aquatic Therapy, Dry Needling, Electrical stimulation, Spinal mobilization, Cryotherapy, Moist heat, Manual therapy, and Re-evaluation  PLAN FOR NEXT SESSION: Patient is now discharged  from PT to long term HEP for continued self management.    Cira Rue, PT, DPT 10/08/2022, 5:28 PM  [facility] [address] [place], [place] [address] P: [phone] I F: [phone]

## 2022-10-23 ENCOUNTER — Encounter: Payer: Self-pay | Admitting: Family Medicine

## 2022-10-28 ENCOUNTER — Telehealth (INDEPENDENT_AMBULATORY_CARE_PROVIDER_SITE_OTHER): Payer: Commercial Managed Care - PPO | Admitting: Family Medicine

## 2022-10-28 VITALS — Ht 62.0 in | Wt 127.1 lb

## 2022-10-28 DIAGNOSIS — H00014 Hordeolum externum left upper eyelid: Secondary | ICD-10-CM

## 2022-10-28 DIAGNOSIS — N393 Stress incontinence (female) (male): Secondary | ICD-10-CM

## 2022-10-28 NOTE — Progress Notes (Signed)
Virtual Visit via Video note  I connected with Carolyn Johnston on 11/10/22 at 1545 by video and verified that I am speaking with the correct person using two identifiers. Carolyn Johnston is currently located at home and is currently alone during visit. The provider, Dana Allan, MD is located in their home at time of visit.  I discussed the limitations, risks, security and privacy concerns of performing an evaluation and management service by video and the availability of in person appointments. I also discussed with the patient that there may be a patient responsible charge related to this service. The patient expressed understanding and agreed to proceed.  Subjective: PCP: Dana Allan, MD  Chief Complaint  Patient presents with   Stye    Been using light therapy. For stye and  Would like an antiobiotic, would like to see if you can refer her  to PT for  female incontinence    HPI Stye on left eye > 1 month Warm compresses frequently  Finally decreased but has not completely gone away Not painful, no decrease or change in vision, no pain on eye movement.  No fevers, eye swelling, discharge or erythema.    Also requesting referral for pelvic floor therapy.  Was previously seen and would like to seen same PT again for same symptoms of SUI.  ROS: Per HPI  Current Outpatient Medications:    clindamycin-benzoyl peroxide (BENZACLIN) gel, Apply topically every morning., Disp: , Rfl:    minoxidil (LONITEN) 2.5 MG tablet, Take 1 tablet (2.5 mg total) by mouth daily., Disp: 90 tablet, Rfl: 3   norgestimate-ethinyl estradiol (SPRINTEC 28) 0.25-35 MG-MCG tablet, Take 1 tablet by mouth daily. CONTINUOUS DOSING, Disp: 120 tablet, Rfl: 3   spironolactone (ALDACTONE) 100 MG tablet, Take 100 mg by mouth daily., Disp: , Rfl:    tretinoin (RETIN-A) 0.1 % cream, SMARTSIG:Sparingly Topical, Disp: , Rfl:    valACYclovir (VALTREX) 1000 MG tablet, TAKE 1 TABLET BY MOUTH EVERY DAY, Disp: 90 tablet,  Rfl: 1   EMGALITY 120 MG/ML SOAJ, SMARTSIG:120 Milligram(s) SUB-Q Every 4 Weeks (Patient not taking: Reported on 10/28/2022), Disp: , Rfl:    tiZANidine (ZANAFLEX) 4 MG tablet, Take 1 tablet (4 mg total) by mouth every 6 (six) hours as needed for muscle spasms. (Patient not taking: Reported on 10/28/2022), Disp: 30 tablet, Rfl: 0  Observations/Objective: Physical Exam Eyes:     Comments: Exam limited. No visible mass,lump or lesion noted. No conjunctival or scleral erythema.    Pulmonary:     Effort: Pulmonary effort is normal.  Neurological:     Mental Status: She is alert and oriented to person, place, and time. Mental status is at baseline.  Psychiatric:        Mood and Affect: Mood normal.        Behavior: Behavior normal.        Thought Content: Thought content normal.        Judgment: Judgment normal.    Assessment and Plan: Hordeolum externum of left upper eyelid Assessment & Plan: Non painful area to upper left eyelid. Not completely resolved despite treatment. No indication for systemic antibiotics Given nature of visit cannot properly assess Recommend ophthalmology evaluation  Orders: -     Ambulatory referral to Ophthalmology  SUI (stress urinary incontinence, female) -     Ambulatory referral to Physical Therapy    Follow Up Instructions: Return if symptoms worsen or fail to improve, for PCP.   I discussed the assessment and  treatment plan with the patient. The patient was provided an opportunity to ask questions and all were answered. The patient agreed with the plan and demonstrated an understanding of the instructions.   The patient was advised to call back or seek an in-person evaluation if the symptoms worsen or if the condition fails to improve as anticipated.  The above assessment and management plan was discussed with the patient. The patient verbalized understanding of and has agreed to the management plan. Patient is aware to call the clinic if symptoms  persist or worsen. Patient is aware when to return to the clinic for a follow-up visit. Patient educated on when it is appropriate to go to the emergency department.    Dana Allan, MD

## 2022-10-28 NOTE — Patient Instructions (Addendum)
It was a pleasure meeting you today. Thank you for allowing me to take part in your health care.  Our goals for today as we discussed include:  Referral ophthalmologist for sty Referral to PT for pelvic floor therapy  Continue to use warm compresses on eye    If you have any questions or concerns, please do not hesitate to call the office at [phone].  I look forward to our next visit and until then take care and stay safe.  Regards,   Dana Allan, MD   [facility]

## 2022-11-10 ENCOUNTER — Encounter: Payer: Self-pay | Admitting: Family Medicine

## 2022-11-10 DIAGNOSIS — N393 Stress incontinence (female) (male): Secondary | ICD-10-CM | POA: Insufficient documentation

## 2022-11-10 DIAGNOSIS — H00014 Hordeolum externum left upper eyelid: Secondary | ICD-10-CM | POA: Insufficient documentation

## 2022-11-10 NOTE — Assessment & Plan Note (Addendum)
Non painful area to upper left eyelid. Not completely resolved despite treatment. No indication for systemic antibiotics Given nature of visit cannot properly assess Recommend ophthalmology evaluation

## 2022-11-12 ENCOUNTER — Other Ambulatory Visit (INDEPENDENT_AMBULATORY_CARE_PROVIDER_SITE_OTHER): Payer: Commercial Managed Care - PPO

## 2022-11-12 DIAGNOSIS — R7401 Elevation of levels of liver transaminase levels: Secondary | ICD-10-CM

## 2022-11-12 LAB — HEPATIC FUNCTION PANEL
ALT: 25 U/L (ref 0–35)
AST: 23 U/L (ref 0–37)
Albumin: 4 g/dL (ref 3.5–5.2)
Alkaline Phosphatase: 58 U/L (ref 39–117)
Bilirubin, Direct: 0.1 mg/dL (ref 0.0–0.3)
Total Bilirubin: 0.4 mg/dL (ref 0.2–1.2)
Total Protein: 7.3 g/dL (ref 6.0–8.3)

## 2022-11-13 ENCOUNTER — Encounter: Payer: Self-pay | Admitting: Family Medicine

## 2022-11-18 ENCOUNTER — Telehealth: Payer: Self-pay | Admitting: Family Medicine

## 2022-11-18 NOTE — Telephone Encounter (Signed)
Called pt and read the letter about her labs.

## 2022-11-18 NOTE — Telephone Encounter (Signed)
Pt would like to be called regarding her lab work 

## 2022-11-19 ENCOUNTER — Ambulatory Visit: Payer: Commercial Managed Care - PPO | Admitting: Obstetrics and Gynecology

## 2022-11-19 ENCOUNTER — Encounter: Payer: Self-pay | Admitting: Obstetrics and Gynecology

## 2022-11-19 VITALS — BP 106/60 | Ht 62.0 in | Wt 122.0 lb

## 2022-11-19 DIAGNOSIS — N898 Other specified noninflammatory disorders of vagina: Secondary | ICD-10-CM

## 2022-11-19 LAB — POCT WET PREP WITH KOH
Clue Cells Wet Prep HPF POC: NEGATIVE
KOH Prep POC: NEGATIVE
Trichomonas, UA: NEGATIVE
Yeast Wet Prep HPF POC: NEGATIVE

## 2022-11-19 MED ORDER — FLUCONAZOLE 150 MG PO TABS
150.0000 mg | ORAL_TABLET | Freq: Once | ORAL | 0 refills | Status: AC
Start: 2022-11-19 — End: 2022-11-19

## 2022-11-19 NOTE — Patient Instructions (Signed)
I value your feedback and you entrusting us with your care. If you get a [facility] patient survey, I would appreciate you taking the time to let us know about your experience today. Thank you! ? ? ?

## 2022-11-19 NOTE — Progress Notes (Signed)
Dana Allan, MD   Chief Complaint  Patient presents with   Vaginal Itching    Irritation, no odor x 2 days    HPI:      [name] is a 42 y.o. G3P0010 whose LMP was No LMP recorded. (Menstrual status: Oral contraceptives)., presents today for vaginal itching/irritation for 2 days. Noticed thick white vag d/c 2 days ago so treated with boric acid twice yesterday. No fishy odor. No recent abx use. No new partners. No new soaps. Uses dove soap (unsure if unscented), dryer sheets, synthetic underwear sometimes. No urin sx.    Patient Active Problem List   Diagnosis Date Noted   SUI (stress urinary incontinence, female) 11/10/2022   Hordeolum externum of left upper eyelid 11/10/2022   Neck pain, chronic 07/27/2022   B12 deficiency 07/27/2022   Encounter for screening for HIV 07/27/2022   Need for hepatitis C screening test 07/27/2022   Abnormal glucose 07/27/2022   High serum cortisol 06/28/2021   Brittle nails 06/26/2021   Hair loss 03/24/2021   Birth control counseling 03/24/2021   Herpes 03/24/2021   Vitamin D insufficiency 06/06/2016   Recurrent occipital headache 04/22/2016   Bilateral occipital neuralgia 04/22/2016   Cervico-occipital neuralgia of right side 04/22/2016   Cervico-occipital neuralgia of left side 04/22/2016   Chronic pain syndrome 04/22/2016   Vestibular migraine 06/01/2013    Past Surgical History:  Procedure Laterality Date   adnoids suture     DILATION AND CURETTAGE OF UTERUS     TONSILLECTOMY     WISDOM TOOTH EXTRACTION      Family History  Problem Relation Age of Onset   Arthritis Mother        RA since 7s   Headache Mother    Fibroids Mother    Rheum arthritis Mother    Drug abuse Father    Diabetes Maternal Grandmother    Parkinson's disease Maternal Grandfather     Social History   Socioeconomic History   Marital status: Single    Spouse name: Not on file   Number of children: Not on file   Years of education:  Not on file   Highest education level: Associate degree: occupational, Scientist, product/process development, or vocational program  Occupational History   Not on file  Tobacco Use   Smoking status: Former    Current packs/day: 0.00    Types: Cigarettes    Quit date: 06/29/2008    Years since quitting: 14.4   Smokeless tobacco: Never  Vaping Use   Vaping status: Never Used  Substance and Sexual Activity   Alcohol use: No   Drug use: No   Sexual activity: Yes    Birth control/protection: Pill  Other Topics Concern   Not on file  Social History Narrative   Works  [employer]   2 daughter (11 and 77 as of 11/21/21)   Social Determinants of Health   Financial Resource Strain: Low Risk  (10/28/2022)   Overall Financial Resource Strain (CARDIA)    Difficulty of Paying Living Expenses: Not very hard  Food Insecurity: No Food Insecurity (10/28/2022)   Hunger Vital Sign    Worried About Running Out of Food in the Last Year: Never true    Ran Out of Food in the Last Year: Never true  Transportation Needs: No Transportation Needs (10/28/2022)   PRAPARE - Administrator, Civil Service (Medical): No    Lack of Transportation (Non-Medical): No  Physical Activity: Insufficiently Active (10/28/2022)  Exercise Vital Sign    Days of Exercise per Week: 5 days    Minutes of Exercise per Session: 20 min  Stress: No Stress Concern Present (10/28/2022)   [employer] of Occupational Health - Occupational Stress Questionnaire    Feeling of Stress : Only a little  Social Connections: Moderately Integrated (10/28/2022)   Social Connection and Isolation Panel [NHANES]    Frequency of Communication with Friends and Family: More than three times a week    Frequency of Social Gatherings with Friends and Family: Twice a week    Attends Religious Services: 1 to 4 times per year    Active Member of [employer] or Organizations: Yes    Attends Banker Meetings: 1 to 4 times per year    Marital Status: Divorced  Careers information officer Violence: Not on file    Outpatient Medications Prior to Visit  Medication Sig Dispense Refill   botulinum toxin Type A (BOTOX) 100 units SOLR injection      clindamycin-benzoyl peroxide (BENZACLIN) gel Apply topically every morning.     ketoconazole (NIZORAL) 2 % shampoo Apply topically.     minoxidil (LONITEN) 2.5 MG tablet Take 1 tablet (2.5 mg total) by mouth daily. 90 tablet 3   neomycin-polymyxin b-dexamethasone (MAXITROL) 3.5-10000-0.1 SUSP 1 drop 3 (three) times daily.     norgestimate-ethinyl estradiol (SPRINTEC 28) 0.25-35 MG-MCG tablet Take 1 tablet by mouth daily. CONTINUOUS DOSING 120 tablet 3   spironolactone (ALDACTONE) 100 MG tablet Take 100 mg by mouth daily.     tretinoin (RETIN-A) 0.1 % cream SMARTSIG:Sparingly Topical     valACYclovir (VALTREX) 1000 MG tablet TAKE 1 TABLET BY MOUTH EVERY DAY 90 tablet 1   No facility-administered medications prior to visit.      ROS:  Review of Systems  Constitutional:  Negative for fever.  Gastrointestinal:  Negative for blood in stool, constipation, diarrhea, nausea and vomiting.  Genitourinary:  Positive for vaginal discharge. Negative for dyspareunia, dysuria, flank pain, frequency, hematuria, urgency, vaginal bleeding and vaginal pain.  Musculoskeletal:  Negative for back pain.  Skin:  Negative for rash.   BREAST: No symptoms   OBJECTIVE:   Vitals:  BP 106/60   Ht 5\' 2"  (1.575 m)   Wt 122 lb (55.3 kg)   BMI 22.31 kg/m   Physical Exam Vitals reviewed.  Constitutional:      Appearance: She is well-developed.  Pulmonary:     Effort: Pulmonary effort is normal.  Genitourinary:    General: Normal vulva.     Pubic Area: No rash.      Labia:        Right: Rash present. No tenderness or lesion.        Left: Rash present. No tenderness or lesion.      Vagina: Normal. No vaginal discharge, erythema or tenderness.     Cervix: Normal.     Uterus: Normal. Not enlarged and not tender.      Adnexa: Right  adnexa normal and left adnexa normal.       Right: No mass or tenderness.         Left: No mass or tenderness.         Comments: BILAT LABIA MINORA WITH ERYTHEMA; NO LESIONS Musculoskeletal:        General: Normal range of motion.     Cervical back: Normal range of motion.  Skin:    General: Skin is warm and dry.  Neurological:     General: No  focal deficit present.     Mental Status: She is alert and oriented to person, place, and time.  Psychiatric:        Mood and Affect: Mood normal.        Behavior: Behavior normal.        Thought Content: Thought content normal.        Judgment: Judgment normal.     Results: Results for orders placed or performed in visit on 11/19/22 (from the past 24 hour(s))  POCT Wet Prep with KOH     Status: Normal   Collection Time: 11/19/22  3:10 PM  Result Value Ref Range   Trichomonas, UA Negative    Clue Cells Wet Prep HPF POC neg    Epithelial Wet Prep HPF POC     Yeast Wet Prep HPF POC neg    Bacteria Wet Prep HPF POC     RBC Wet Prep HPF POC     WBC Wet Prep HPF POC     KOH Prep POC Negative Negative     Assessment/Plan: Vaginal itching - Plan: fluconazole (DIFLUCAN) 150 MG tablet, POCT Wet Prep with KOH; neg wet prep, pos exam. Rx diflucan, f/u prn.  Dove sens skin soap, line dry underwear, cotton underwear, OTC hydrocortisone crm/cool compresses. F/u prn.    Meds ordered this encounter  Medications   fluconazole (DIFLUCAN) 150 MG tablet    Sig: Take 1 tablet (150 mg total) by mouth once for 1 dose.    Dispense:  1 tablet    Refill:  0    Order Specific Question:   Supervising Provider    Answer:   Hildred Laser [[id number]]      Return if symptoms worsen or fail to improve.  Zailey Audia B. Braycen Burandt, PA-C 11/19/2022 3:11 PM

## 2022-11-21 ENCOUNTER — Ambulatory Visit: Payer: Commercial Managed Care - PPO | Admitting: Family Medicine

## 2023-01-30 ENCOUNTER — Encounter: Payer: Self-pay | Admitting: Family Medicine

## 2023-03-21 ENCOUNTER — Ambulatory Visit (INDEPENDENT_AMBULATORY_CARE_PROVIDER_SITE_OTHER): Payer: Commercial Managed Care - PPO | Admitting: Nurse Practitioner

## 2023-03-21 ENCOUNTER — Encounter: Payer: Self-pay | Admitting: Nurse Practitioner

## 2023-03-21 VITALS — BP 118/70 | HR 78 | Temp 97.9°F | Ht 62.0 in | Wt 118.4 lb

## 2023-03-21 DIAGNOSIS — N12 Tubulo-interstitial nephritis, not specified as acute or chronic: Secondary | ICD-10-CM | POA: Diagnosis not present

## 2023-03-21 DIAGNOSIS — R7881 Bacteremia: Secondary | ICD-10-CM

## 2023-03-21 DIAGNOSIS — Z09 Encounter for follow-up examination after completed treatment for conditions other than malignant neoplasm: Secondary | ICD-10-CM | POA: Diagnosis not present

## 2023-03-21 NOTE — Telephone Encounter (Signed)
Printed forms and gave to [employer].

## 2023-03-21 NOTE — Progress Notes (Unsigned)
Established Patient Office Visit  Subjective:  Patient ID: Carolyn Johnston, female    DOB: 1980/06/03  Age: 43 y.o. MRN: [id number]  CC:  Chief Complaint  Patient presents with   Hospitalization Follow-up   Discussed the use of a AI scribe software for clinical note transcription with the patient, who gave verbal consent to proceed.  HPI  Carolyn Johnston presents for hospital follow up. She was hospitalized on 03/11/23 due to positive blood culture for enterococcus bacteremia. She was seen in the ED  on 03/10/23 due to left sided flank pain and LLQ pain with nausea and vomiting. The WBC was elevated to 13.0. The abdominal CT done showing duplicated left renal collecting system with 0.3 cm stone in proximal ureter off left lower renal collecting system and multiple 0.3 cm stones in distal ureter off left lower renal collecting system with mild hydroureteronephrosis of left lower renal collecting system   Since discharge she has been experienced significant tiredness upon returning to work, which improved slightly over time. She had follow up with the infectious disease yesterday. She is scheduled to see urology on 04/16/23. She is taking the amoxicillin.   Denise fever, stomach pain.   HPI   Past Medical History:  Diagnosis Date   Allergy    trees, dust mites   COVID-19    x2   Migraines     Past Surgical History:  Procedure Laterality Date   adnoids suture     DILATION AND CURETTAGE OF UTERUS     TONSILLECTOMY     WISDOM TOOTH EXTRACTION      Family History  Problem Relation Age of Onset   Arthritis Mother        RA since 30s   Headache Mother    Fibroids Mother    Rheum arthritis Mother    Drug abuse Father    Diabetes Maternal Grandmother    Parkinson's disease Maternal Grandfather     Social History   Socioeconomic History   Marital status: Single    Spouse name: Not on file   Number of children: Not on file   Years of education: Not on file   Highest  education level: Associate degree: occupational, Scientist, product/process development, or vocational program  Occupational History   Not on file  Tobacco Use   Smoking status: Former    Current packs/day: 0.00    Types: Cigarettes    Quit date: 06/29/2008    Years since quitting: 14.7   Smokeless tobacco: Never  Vaping Use   Vaping status: Never Used  Substance and Sexual Activity   Alcohol use: No   Drug use: No   Sexual activity: Yes    Birth control/protection: Pill  Other Topics Concern   Not on file  Social History Narrative   Works  [employer]   2 daughter (11 and 4 as of 11/21/21)   Social Drivers of Health   Financial Resource Strain: Low Risk  (10/28/2022)   Overall Financial Resource Strain (CARDIA)    Difficulty of Paying Living Expenses: Not very hard  Food Insecurity: No Food Insecurity (10/28/2022)   Hunger Vital Sign    Worried About Running Out of Food in the Last Year: Never true    Ran Out of Food in the Last Year: Never true  Transportation Needs: No Transportation Needs (10/28/2022)   PRAPARE - Administrator, Civil Service (Medical): No    Lack of Transportation (Non-Medical): No  Physical Activity: Insufficiently Active (10/28/2022)  Exercise Vital Sign    Days of Exercise per Week: 5 days    Minutes of Exercise per Session: 20 min  Stress: No Stress Concern Present (10/28/2022)   [employer] of Occupational Health - Occupational Stress Questionnaire    Feeling of Stress : Only a little  Social Connections: Moderately Integrated (10/28/2022)   Social Connection and Isolation Panel [NHANES]    Frequency of Communication with Friends and Family: More than three times a week    Frequency of Social Gatherings with Friends and Family: Twice a week    Attends Religious Services: 1 to 4 times per year    Active Member of [employer] or Organizations: Yes    Attends Banker Meetings: 1 to 4 times per year    Marital Status: Divorced  Catering manager Violence: Not on file      Outpatient Medications Prior to Visit  Medication Sig Dispense Refill   botulinum toxin Type A (BOTOX) 100 units SOLR injection      clindamycin-benzoyl peroxide (BENZACLIN) gel Apply topically every morning.     ketoconazole (NIZORAL) 2 % shampoo Apply topically.     minoxidil (LONITEN) 2.5 MG tablet Take 1 tablet (2.5 mg total) by mouth daily. 90 tablet 3   neomycin-polymyxin b-dexamethasone (MAXITROL) 3.5-10000-0.1 SUSP 1 drop 3 (three) times daily.     norgestimate-ethinyl estradiol (SPRINTEC 28) 0.25-35 MG-MCG tablet Take 1 tablet by mouth daily. CONTINUOUS DOSING 120 tablet 3   spironolactone (ALDACTONE) 100 MG tablet Take 100 mg by mouth daily.     tretinoin (RETIN-A) 0.1 % cream SMARTSIG:Sparingly Topical     valACYclovir (VALTREX) 1000 MG tablet TAKE 1 TABLET BY MOUTH EVERY DAY 90 tablet 1   No facility-administered medications prior to visit.    No Known Allergies  ROS Review of Systems Negative unless indicated in HPI.    Objective:    Physical Exam Constitutional:      Appearance: Normal appearance.  Cardiovascular:     Rate and Rhythm: Normal rate and regular rhythm.     Pulses: Normal pulses.     Heart sounds: Normal heart sounds.  Pulmonary:     Effort: Pulmonary effort is normal.     Breath sounds: Normal breath sounds.  Abdominal:     General: Bowel sounds are normal.     Palpations: Abdomen is soft.     Tenderness: There is no abdominal tenderness. There is no right CVA tenderness or left CVA tenderness.  Musculoskeletal:     Cervical back: Normal range of motion.  Neurological:     General: No focal deficit present.     Mental Status: She is alert. Mental status is at baseline.  Psychiatric:        Mood and Affect: Mood normal.        Behavior: Behavior normal.        Thought Content: Thought content normal.        Judgment: Judgment normal.     BP 118/70   Pulse 78   Temp 97.9 F (36.6 C)   Ht 5\' 2"  (1.575 m)   Wt 118 lb 6.4 oz (53.7  kg)   SpO2 98%   BMI 21.66 kg/m  Wt Readings from Last 3 Encounters:  03/21/23 118 lb 6.4 oz (53.7 kg)  11/19/22 122 lb (55.3 kg)  10/28/22 127 lb 1.6 oz (57.7 kg)     Health Maintenance  Topic Date Due   COVID-19 Vaccine (1 - 2024-25 season) 04/05/2023 (Originally 10/20/2022)  INFLUENZA VACCINE  05/19/2023 (Originally 09/19/2022)   MAMMOGRAM  05/01/2024   Cervical Cancer Screening (HPV/Pap Cotest)  04/05/2027   DTaP/Tdap/Td (3 - Td or Tdap) 03/09/2029   Hepatitis C Screening  Completed   HIV Screening  Completed   HPV VACCINES  Aged Out    There are no preventive care reminders to display for this patient.  Lab Results  Component Value Date   TSH 0.45 04/23/2021   Lab Results  Component Value Date   WBC 8.2 08/09/2022   HGB 12.9 08/09/2022   HCT 39.2 08/09/2022   MCV 92.7 08/09/2022   PLT 327.0 08/09/2022   Lab Results  Component Value Date   NA 138 08/09/2022   K 4.4 08/09/2022   CO2 25 08/09/2022   GLUCOSE 91 08/09/2022   BUN 19 08/09/2022   CREATININE 0.89 08/09/2022   BILITOT 0.4 11/12/2022   ALKPHOS 58 11/12/2022   AST 23 11/12/2022   ALT 25 11/12/2022   PROT 7.3 11/12/2022   ALBUMIN 4.0 11/12/2022   CALCIUM 9.7 08/09/2022   ANIONGAP 7 04/22/2016   GFR 79.68 08/09/2022   Lab Results  Component Value Date   CHOL 220 (H) 08/09/2022   Lab Results  Component Value Date   HDL 55.90 08/09/2022   Lab Results  Component Value Date   LDLCALC 124 (H) 08/09/2022   Lab Results  Component Value Date   TRIG 198.0 (H) 08/09/2022   Lab Results  Component Value Date   CHOLHDL 4 08/09/2022   Lab Results  Component Value Date   HGBA1C 5.4 08/09/2022      Assessment & Plan:  Positive blood culture Assessment & Plan: Two separate sepsis infections identified, currently on targeted antibiotic therapy. Halfway through 12-day course of antibiotics, with no signs of worsening infection. -Complete the full course of antibiotics. -Monitor for any signs of  worsening infection, including increased kidney pain or fatigue.    Hospital discharge follow-up Assessment & Plan: Patient was hospitalized at [facility] from 03/11/23 to 03/15/23.  The hospital notes, imaging and reviewed.    Pyelonephritis Assessment & Plan: Acute, improved -Continue current antibiotic regimen. -Monitor for worsening symptoms and reach out to urology if no improvement     Follow-up: No follow-ups on file.   Kara Dies, NP

## 2023-03-23 DIAGNOSIS — R7881 Bacteremia: Secondary | ICD-10-CM | POA: Insufficient documentation

## 2023-03-23 DIAGNOSIS — Z09 Encounter for follow-up examination after completed treatment for conditions other than malignant neoplasm: Secondary | ICD-10-CM | POA: Insufficient documentation

## 2023-03-23 DIAGNOSIS — N12 Tubulo-interstitial nephritis, not specified as acute or chronic: Secondary | ICD-10-CM | POA: Insufficient documentation

## 2023-03-23 NOTE — Assessment & Plan Note (Signed)
Acute, improved -Continue current antibiotic regimen. -Monitor for worsening symptoms and reach out to urology if no improvement

## 2023-03-23 NOTE — Assessment & Plan Note (Signed)
Patient was hospitalized at [facility] from 03/11/23 to 03/15/23.  The hospital notes, imaging and reviewed.

## 2023-03-23 NOTE — Assessment & Plan Note (Signed)
Two separate sepsis infections identified, currently on targeted antibiotic therapy. Halfway through 12-day course of antibiotics, with no signs of worsening infection. -Complete the full course of antibiotics. -Monitor for any signs of worsening infection, including increased kidney pain or fatigue.

## 2023-03-25 NOTE — Telephone Encounter (Signed)
Form is complete and ready to fax

## 2023-03-28 ENCOUNTER — Telehealth: Payer: Self-pay | Admitting: Family Medicine

## 2023-03-28 ENCOUNTER — Other Ambulatory Visit: Payer: Self-pay | Admitting: Obstetrics and Gynecology

## 2023-03-28 DIAGNOSIS — B009 Herpesviral infection, unspecified: Secondary | ICD-10-CM

## 2023-03-28 NOTE — Telephone Encounter (Signed)
 Copied from CRM [id number]. Topic: Medical Record Request - Other >> Mar 28, 2023 10:11 AM Laurier BROCKS wrote: Reason for CRM: FMLA paperwork was picked up on Wednesday. It was done incorrectly. Patient is wanting a status update on the new paperwork that is due to be completed. She would like a call back at [phone] (M)

## 2023-03-28 NOTE — Telephone Encounter (Signed)
 Copied made of form and placed up front for pt to pick up.

## 2023-03-28 NOTE — Telephone Encounter (Signed)
Patient is aware that paperwork is ready for pick up

## 2023-03-28 NOTE — Telephone Encounter (Signed)
 Noted! Thank you

## 2023-04-01 ENCOUNTER — Other Ambulatory Visit: Payer: Self-pay | Admitting: Obstetrics and Gynecology

## 2023-04-01 DIAGNOSIS — Z1231 Encounter for screening mammogram for malignant neoplasm of breast: Secondary | ICD-10-CM

## 2023-04-01 NOTE — Telephone Encounter (Signed)
Spoke to Patient she states that her paperwork needs to be filled out from her hospitalization through 04/18/23 even though she was released to go back to work on 03/19/23. Patient states she needs it covered through 04/18/23 so it will also cover her follow up appointments. Patient states she needs it done now not to wait on FMLA to send the form they need filled out. Printed out the paperwork and gave to [employer].

## 2023-04-01 NOTE — Telephone Encounter (Signed)
Kara Dies gave me corrected paperwork so I placed up front for the Patient to pick up.

## 2023-04-01 NOTE — Telephone Encounter (Signed)
Please call pt and check regarding the paper work.

## 2023-04-01 NOTE — Telephone Encounter (Signed)
Paper work completed and ready for fax.

## 2023-04-02 NOTE — Telephone Encounter (Signed)
Paperwork was faxed and up front for Patient to pick up and Patient is aware.

## 2023-04-07 DIAGNOSIS — Z0279 Encounter for issue of other medical certificate: Secondary | ICD-10-CM

## 2023-04-10 ENCOUNTER — Telehealth: Payer: Self-pay | Admitting: Family Medicine

## 2023-04-10 NOTE — Telephone Encounter (Signed)
Carolyn Johnston called back. She needs office notes, 1/30/205, dates are questionable, return to work. Phone number is [phone].

## 2023-04-10 NOTE — Telephone Encounter (Signed)
Copied from CRM [id number]. Topic: General - Other >> Apr 09, 2023  4:55 PM [place] J wrote: Reason for CRM: Lurena Joiner from [employer] calling in needing more information for patient's short term disability claim. Information has to be relayed before 04/16/2023.  Callback Number: [phone]

## 2023-04-10 NOTE — Telephone Encounter (Signed)
Left message for Carolyn Johnston from [employer] to give our office a call back.

## 2023-04-11 NOTE — Telephone Encounter (Signed)
Called patient for clarification on this paperwork. She had initially asked that we update the paper work and sent a picture of what she needed.  In talking with pt she asked we not make the requested changes and that she would send me an email that she received from claims department.

## 2023-04-15 NOTE — Telephone Encounter (Signed)
 Please fax the paperwork

## 2023-04-16 NOTE — Telephone Encounter (Signed)
 Faxed to [facility] at [phone]

## 2023-04-16 NOTE — Telephone Encounter (Signed)
 Received an okay confirmation from [facility]

## 2023-04-24 ENCOUNTER — Ambulatory Visit: Payer: Commercial Managed Care - PPO | Attending: Family Medicine | Admitting: Physical Therapy

## 2023-04-24 ENCOUNTER — Encounter: Payer: Self-pay | Admitting: Physical Therapy

## 2023-04-24 DIAGNOSIS — R2689 Other abnormalities of gait and mobility: Secondary | ICD-10-CM | POA: Insufficient documentation

## 2023-04-24 DIAGNOSIS — M542 Cervicalgia: Secondary | ICD-10-CM | POA: Diagnosis present

## 2023-04-24 DIAGNOSIS — M5459 Other low back pain: Secondary | ICD-10-CM | POA: Diagnosis present

## 2023-04-24 DIAGNOSIS — N393 Stress incontinence (female) (male): Secondary | ICD-10-CM | POA: Diagnosis not present

## 2023-04-24 DIAGNOSIS — M533 Sacrococcygeal disorders, not elsewhere classified: Secondary | ICD-10-CM | POA: Diagnosis present

## 2023-04-24 NOTE — Therapy (Signed)
 OUTPATIENT PHYSICAL THERAPY EVALUATION   Patient Name: Carolyn Johnston MRN: [id number] DOB:12/01/1980, 43 y.o., female Today's Date: 04/24/2023   PT End of Session - 04/24/23 1620     Visit Number 1    Number of Visits 10    Date for PT Re-Evaluation 07/03/23    PT Start Time 1615    PT Stop Time 1700    PT Time Calculation (min) 45 min    Equipment Utilized During Treatment Gait belt    Activity Tolerance No increased pain;Patient tolerated treatment well    Behavior During Therapy [facility] for tasks assessed/performed             Past Medical History:  Diagnosis Date   Allergy    trees, dust mites   COVID-19    x2   Migraines    Past Surgical History:  Procedure Laterality Date   adnoids suture     DILATION AND CURETTAGE OF UTERUS     TONSILLECTOMY     WISDOM TOOTH EXTRACTION     Patient Active Problem List   Diagnosis Date Noted   Hospital discharge follow-up 03/23/2023   Positive blood culture 03/23/2023   Pyelonephritis 03/23/2023   SUI (stress urinary incontinence, female) 11/10/2022   Hordeolum externum of left upper eyelid 11/10/2022   Neck pain, chronic 07/27/2022   B12 deficiency 07/27/2022   Encounter for screening for HIV 07/27/2022   Need for hepatitis C screening test 07/27/2022   Abnormal glucose 07/27/2022   High serum cortisol 06/28/2021   Brittle nails 06/26/2021   Hair loss 03/24/2021   Birth control counseling 03/24/2021   Herpes 03/24/2021   Vitamin D insufficiency 06/06/2016   Recurrent occipital headache 04/22/2016   Bilateral occipital neuralgia 04/22/2016   Cervico-occipital neuralgia of right side 04/22/2016   Cervico-occipital neuralgia of left side 04/22/2016   Chronic pain syndrome 04/22/2016   Vestibular migraine 06/01/2013    PCP: Clent Ridges   REFERRING PROVIDER: Meribeth Mattes DIAG: Urinary stress incontinence   Rationale for Evaluation and Treatment Rehabilitation  THERAPY DIAG:   Sacrococcygeal pain  Other  abnormalities of gait and mobility  Neck pain  Other low back pain  ONSET DATE:   SUBJECTIVE:                                                                                                                                                                                           SUBJECTIVE STATEMENT ON EVAL 04/24/23 : 1) urinary leakage:  leakage with coughing , sneezing, laughing , and yelling with cheering for dtr's sport teams. Does not wear pads.  Able to make it  to the bathroom without leakage.  Nocturia 1x/ night.    2) incomplete emptying urine:  Pt feels she has to sit and there and pee again 30% of the time when she goes to pee . Pt has felt a heavier feeling her vagina with sitting and standing and with coughing, yelling   3) neck pain:   when she has a flare-up with neck pain 7/10 , normal neck pain 4/10 , pain with looking up, and flare ups can occur with sleeping with on her belly partially . Pt reports she can not sleep on her back but her neck is uncomfortable . Radiating pain occurs can occur down either arm to the pinky.     4) low back pain: after being on her feet all day and bending over and along the ballmounds of her feet and with work activities which requires lifting 20 lbs repeatedly and twist and turning .  Constant agitating pain level of 3/10 . Non radiating pain  . Located across L/S  line. Pain started in her early 13s.                 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 Yes: see above   PRECAUTIONS: No   WEIGHT BEARING RESTRICTIONS: No   FALLS:  Has patient fallen in last 6 months? 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)    PLOF: IND   PATIENT GOALS:  Improve skills to do at home help relieve pain and discomfort and retrain her bladder    OBJECTIVE:   [facility] PT Assessment - 04/24/23 1725       Other:   Other/ Comments simulated work tasks: bilateral narrow BOS , R arm elevated, repeated R arm lifting      Posture/Postural Control   Posture Comments slumped sitting      AROM   Overall AROM Comments seated cervical ext 50 deg with pain , plan to assess other direction at upcoming sessions      Palpation   SI assessment  R shoulder and L iliac crest higher '      Ambulation/Gait   Gait Comments 1.2 m/s  , decreased stance on R, minimial armswings             [facility] Adult PT Treatment/Exercise - 04/24/23 1725       Therapeutic Activites    Therapeutic Activities Other Therapeutic Activities    Other Therapeutic Activities provided wider BOS with standing work tasks, discussed plan to provide asymmetrical strethces to minimize overuse of R UE and to address asymmetrical spinal/ pelvic alignment, co-created goals to address body partss for ADLs and work tasks with regional interdependent appproaches      Neuro Re-ed    Neuro Re-ed Details  cued for wider BOS in standing, more propioception of feet and less knocked knees, cued for LKC stretch,  HOME EXERCISE PROGRAM: See pt instruction section    ASSESSMENT:  CLINICAL IMPRESSION:  Pt is a  43 yo  who presents with  following issues which impact QOL, ADL,  fitness, social and community activities:    urinary leakage  incomplete emptying urine neck pain low back pain  Pt's musculoskeletal assessment revealed uneven pelvic girdle and shoulder height, asymmetries to gait pattern, limited spinal /pelvic mobility,  dyscoordination and strength of pelvic floor mm, poor body mechanics which places strain on the abdominal/pelvic floor mm. These are deficits that indicate an ineffective intraabdominal pressure system associated with increased risk for pt's Sx.   Regional interdependent approaches will yield greater benefits in pt's POC due to the complexity of pt's medical Hx and the significant impact their Sx have had on their QOL. Pt would benefit from a biopsychosocial approach to yield optimal outcomes. Plan to build interdisciplinary team with pt's providers to optimize patient-centered care.      Today, Pt was provided work modifications today to promote pelvic and lumbar stability with wider BOS and semi tandem stance. Provided cues for [facility] stretch.Plan to address realignment of spine/ pelvis at next session to help promote optimize IAP system for improved pelvic floor function, trunk stability, gait, balance, stabilization with mobility tasks.  Plan to address pelvic floor issues once pelvis and spine are realigned to yield better outcomes.   Pt benefits from skilled PT.    OBJECTIVE IMPAIRMENTS decreased activity tolerance, decreased coordination, decreased endurance, decreased mobility, difficulty walking, decreased ROM, decreased strength, decreased safety awareness, hypomobility, increased muscle spasms, impaired flexibility, improper body mechanics, postural dysfunction, and pain.   ACTIVITY LIMITATIONS  self-care,  home chores, work tasks    PARTICIPATION LIMITATIONS:  community, gym activities    PERSONAL FACTORS        are also affecting patient's functional outcome.    REHAB POTENTIAL: Good   CLINICAL DECISION MAKING: Evolving/moderate complexity   EVALUATION COMPLEXITY: Moderate    PATIENT EDUCATION:    Education details: Showed pt anatomy images. Explained muscles attachments/ connection, physiology of deep core system/ spinal- thoracic-pelvis-lower kinetic chain as they relate to  pt's presentation, Sx, and past Hx. Explained what and how these areas of deficits need to be restored to balance and function    See Therapeutic activity / neuromuscular re-education section  Answered pt's questions.   Person educated: Patient Education method: Explanation, Demonstration, Tactile cues, Verbal cues, and Handouts Education comprehension: verbalized understanding, returned demonstration, verbal cues required, tactile cues required, and needs further education     PLAN: PT FREQUENCY: 1x/week   PT DURATION: 10 weeks   PLANNED INTERVENTIONS: Therapeutic exercises, Therapeutic activity, Neuromuscular re-education, Balance training, Gait training, Patient/Family education, Self Care, Joint mobilization, Spinal mobilization, Moist heat, Taping, and Manual therapy, dry needling.   PLAN FOR NEXT SESSION: See clinical impression for plan     GOALS: Goals reviewed with patient? Yes  SHORT TERM GOALS: Target date: 05/22/2023    Pt will demo IND with HEP                    Baseline: Not IND            Goal status: INITIAL   LONG TERM GOALS: Target date: 07/03/2023    1.Pt will demo proper deep core coordination without chest breathing and optimal excursion of diaphragm/pelvic floor in order to promote spinal stability and pelvic floor function  Baseline: dyscoordination Goal status: INITIAL  2.  Pt will demo  proper body mechanics in against gravity tasks and ADLs  work tasks, fitness  to minimize straining pelvic floor / back    Baseline: not IND, improper form that places strain on pelvic floor  Goal status: INITIAL    3. Pt will demo increased gait speed > 1.3 m/s with reciprocal gait pattern, longer stride length  in order to ambulate safely in community and return to fitness routine  Baseline:  Goal status: INITIAL    4. Pt will report decreased leakage by < 50% of the time with  coughing , sneezing, laughing , and yelling with cheering for dtr's sport teams.   Baseline:   leakage with coughing , sneezing, laughing , and yelling with cheering for dtr's sport teams.  Goal status: INITIAL   5. Pt will report able to complete urinate and not have to wait to finish across 100% of the time  Baseline: Pt feels she has to sit and there and pee again 30% of the time when she goes to pee . Goal status: INITIAL  6. Pt will demo levelled pelvic girdle and shoulder height in order to progress to deep core strengthening HEP and restore mobility at spine, pelvis, gait, posture minimize falls, and improve balance   Baseline: R shoulder and L iliac crest higher   Goal status: INITIAL   7. Pt will demo increased cervical ext 69 deg no pain, no flareup of neck pain across one month and compliance with avoid sleeping on belly in order to restore neck function for ADLs and work   Baseline:  pain with looking up, and flare ups can occur with sleeping with on her belly partially . Pt reports she can not sleep on her back but her neck is uncomfortable  Radiating pain occurs can occur down either arm to the pinky with flare ups once a month  Cervical ext 50 with pain   Goal Status:  INITIAL    8. Pt will report being on her feet all day, bending with no constant agitating pain level of 3/10 and no pain along ballmound of feet in order to perform work tasks   Baseline:after being on her feet all day and bending over and along the ballmounds of her feet and with work activities which requires lifting 20 lbs repeatedly and twist and turning .  Constant agitating pain level of 3/10  Goal Status:  INITIAL   Mariane Masters, PT 04/24/2023, 4:43 PM

## 2023-04-24 NOTE — Patient Instructions (Signed)
 work modificationsto promote pelvic and lumbar stability with wider BOS and semi tandem stance.   __  Stretches :     -Seated pigeon Sit at 45 deg turn with R leg and knee on edge of chair/ bench, L buttock hanging off the edge to bring the L foot back like a lunge, toes bent, lower heel to feel quad stretch,  pay attention to keeping pinky and first toe ballmound planted to align toes forward so ankles are not twerked   Repeat with other side   __

## 2023-04-25 ENCOUNTER — Other Ambulatory Visit (HOSPITAL_COMMUNITY)
Admission: RE | Admit: 2023-04-25 | Discharge: 2023-04-25 | Disposition: A | Discharge status: Home / Self Care | Source: Ambulatory Visit | Attending: Obstetrics | Admitting: Obstetrics

## 2023-04-25 ENCOUNTER — Ambulatory Visit (INDEPENDENT_AMBULATORY_CARE_PROVIDER_SITE_OTHER): Admitting: Obstetrics

## 2023-04-25 ENCOUNTER — Encounter: Payer: Self-pay | Admitting: Obstetrics

## 2023-04-25 VITALS — BP 124/73 | HR 78 | Ht 62.0 in | Wt 117.0 lb

## 2023-04-25 DIAGNOSIS — N898 Other specified noninflammatory disorders of vagina: Secondary | ICD-10-CM | POA: Diagnosis not present

## 2023-04-25 DIAGNOSIS — Z113 Encounter for screening for infections with a predominantly sexual mode of transmission: Secondary | ICD-10-CM | POA: Insufficient documentation

## 2023-04-25 MED ORDER — FC2 FEMALE CONDOM MISC: 1.0000 [IU] | Freq: Every day | 12 refills | Status: DC | PRN

## 2023-04-25 NOTE — Progress Notes (Signed)
   GYN ENCOUNTER  Subjective  HPI: Carolyn Johnston is a 43 y.o. G3P0010 who presents today for vaginal itching and discharge. She experienced some itching after intercourse and applied cortisone. This made the itching worse, and she began having a chunky white discharge. She used some Monistat, which improved the discharge. However, she feels like her vagina is chapped and itchy. She denies any changes to her hygiene routine or bath products.  Past Medical History:  Diagnosis Date   Allergy    trees, dust mites   COVID-19    x2   Migraines    Past Surgical History:  Procedure Laterality Date   adnoids suture     DILATION AND CURETTAGE OF UTERUS     TONSILLECTOMY     WISDOM TOOTH EXTRACTION     OB History     Gravida  3   Para  2   Term      Preterm      AB  1   Living         SAB  1   IAB      Ectopic      Multiple      Live Births             No Known Allergies  ROS: See HPI  Objective  BP 124/73   Pulse 78   Ht 5\' 2"  (1.575 m)   Wt 117 lb (53.1 kg)   BMI 21.40 kg/m   Physical examination   Pelvic:   Vulva: Normal appearance.  No lesions. Small erythematous patches on inner side of labia minora and near clitoral hood.  Vagina: No lesions or abnormalities noted.  Urethra No masses tenderness or scarring.  Meatus Normal size without lesions or prolapse.  Cervix: Normal appearance.  No lesions. Mildly erythematous, moderate amount of creamy white discharge.  Perineum: Normal exam.  No lesions.    Assessment -Vaginal irritation  Plan -STI swabs collected. -Encouraged comfort measures including warm ACV baths, Aquafor, loose clothing, no underwear at night -Discussed using female condoms since female condoms do not fit her partner appropriately  Guadlupe Spanish, CNM

## 2023-04-29 ENCOUNTER — Ambulatory Visit: Admitting: Physical Therapy

## 2023-04-29 LAB — CERVICOVAGINAL ANCILLARY ONLY
Bacterial Vaginitis (gardnerella): NEGATIVE
Candida Glabrata: NEGATIVE
Candida Vaginitis: POSITIVE — AB
Chlamydia: NEGATIVE
Comment: NEGATIVE
Comment: NEGATIVE
Comment: NEGATIVE
Comment: NEGATIVE
Comment: NEGATIVE
Comment: NORMAL
Neisseria Gonorrhea: NEGATIVE
Trichomonas: NEGATIVE

## 2023-04-30 ENCOUNTER — Encounter: Payer: Self-pay | Admitting: Obstetrics and Gynecology

## 2023-04-30 MED ORDER — FLUCONAZOLE 150 MG PO TABS: 150.0000 mg | ORAL_TABLET | Freq: Once | ORAL | 1 refills | Status: AC

## 2023-05-01 ENCOUNTER — Ambulatory Visit: Payer: Commercial Managed Care - PPO | Admitting: Physical Therapy

## 2023-05-01 ENCOUNTER — Ambulatory Visit: Admitting: Physical Therapy

## 2023-05-01 DIAGNOSIS — M533 Sacrococcygeal disorders, not elsewhere classified: Secondary | ICD-10-CM | POA: Diagnosis not present

## 2023-05-01 DIAGNOSIS — M5459 Other low back pain: Secondary | ICD-10-CM

## 2023-05-01 DIAGNOSIS — R2689 Other abnormalities of gait and mobility: Secondary | ICD-10-CM

## 2023-05-01 NOTE — Patient Instructions (Signed)
  _wall stretch Clock stretch with head turns :  stand perpendicular to the wall, L side to the wall Tilt head to wall  Place L palm at 7 o clock Chin tuck like you are looking into armpit Look at "[place] on giant map  Swivel head with chin tucked to look in upper corner of ceiling as if you are look at  [place] on giant map "   5 reps   Switch direction, R palm on wall at 5 o 'clock   Chin tuck like you are looking into armpit Look at "[place]  on giant map  Swivel head with chin tucked to look in upper corner of ceiling as if you are look at  [facility] on giant map "   10 reps    ___  ZigZag stretch  Reclined twist for hips and side of the hips/ legs  Lay on your back, knees bend Scoot hips to the R , leave shoulders in place Wobble knees to the L side 45 deg and to midline  10 reps    For 5 min  resting onto pillows to keep leg at the same width of hips Pillow under L thigh to minimize too much strain '  __  Restorative yoga:  legs propped on pillows like a ramp Folded blanket under the hips to elevate hips above heart  15 min

## 2023-05-01 NOTE — Therapy (Addendum)
 OUTPATIENT PHYSICAL THERAPY TREATMENT    Patient Name: Carolyn Johnston MRN: [id number] DOB:1980/02/26, 43 y.o., female Today's Date: 05/01/2023   PT End of Session - 05/01/23 1730     Visit Number 2    Number of Visits 10    Date for PT Re-Evaluation 07/03/23    PT Start Time 1635    PT Stop Time 1729    PT Time Calculation (min) 54 min    Activity Tolerance No increased pain;Patient tolerated treatment well    Behavior During Therapy [facility] for tasks assessed/performed             Past Medical History:  Diagnosis Date   Allergy    trees, dust mites   COVID-19    x2   Migraines    Past Surgical History:  Procedure Laterality Date   adnoids suture     DILATION AND CURETTAGE OF UTERUS     TONSILLECTOMY     WISDOM TOOTH EXTRACTION     Patient Active Problem List   Diagnosis Date Noted   Hospital discharge follow-up 03/23/2023   Positive blood culture 03/23/2023   Pyelonephritis 03/23/2023   SUI (stress urinary incontinence, female) 11/10/2022   Hordeolum externum of left upper eyelid 11/10/2022   Neck pain, chronic 07/27/2022   B12 deficiency 07/27/2022   Encounter for screening for HIV 07/27/2022   Need for hepatitis C screening test 07/27/2022   Abnormal glucose 07/27/2022   High serum cortisol 06/28/2021   Brittle nails 06/26/2021   Hair loss 03/24/2021   Birth control counseling 03/24/2021   Herpes 03/24/2021   Vitamin D insufficiency 06/06/2016   Recurrent occipital headache 04/22/2016   Bilateral occipital neuralgia 04/22/2016   Cervico-occipital neuralgia of right side 04/22/2016   Cervico-occipital neuralgia of left side 04/22/2016   Chronic pain syndrome 04/22/2016   Vestibular migraine 06/01/2013    PCP: Clent Ridges   REFERRING PROVIDER: Meribeth Mattes DIAG: Urinary stress incontinence   Rationale for Evaluation and Treatment Rehabilitation  THERAPY DIAG:   Sacrococcygeal pain  Other abnormalities of gait and mobility  Neck  pain  Other low back pain  ONSET DATE:   SUBJECTIVE:                   SUBJECTIVE STATEMENT TODAY:                      Pt  practiced the modifications at work                     Today she feels L hip is tighter than R  SUBJECTIVE STATEMENT ON EVAL 04/24/23 : 1) urinary leakage:  leakage with coughing , sneezing, laughing , and yelling with cheering for dtr's sport teams. Does not wear pads.  Able to make it to the bathroom without leakage.  Nocturia 1x/ night.    2) incomplete emptying urine:  Pt feels she has to sit and there and pee again 30% of the time when she goes to pee . Pt has felt a heavier feeling her vagina with sitting and standing and with coughing, yelling   3) neck pain:   when she has a flare-up with neck pain 7/10 , normal neck pain 4/10 , pain with looking up, and flare ups can occur with sleeping with on her belly partially . Pt reports she can not sleep on her back but her neck is uncomfortable . Radiating pain occurs can occur down either arm to the pinky.     4) low back pain: after being on her feet all day and bending over and along the ballmounds of her feet and with work activities which requires lifting 20 lbs repeatedly and twist and turning .  Constant agitating pain level of 3/10 . Non radiating pain  . Located across L/S  line. Pain started in her early 47s.                 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 Yes: see above   PRECAUTIONS: No   WEIGHT BEARING  RESTRICTIONS: No   FALLS:  Has patient fallen in last 6 months? 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)    PLOF: IND   PATIENT GOALS:  Improve skills to do at home help relieve pain and discomfort and retrain her bladder    OBJECTIVE:   [facility] PT Assessment - 05/01/23 1712       Palpation   SI assessment  L shoulder, iliac crest , low rib , medial malleoli higher in supine, L shoulder/ iliac crest higher  standing    Palpation comment deviated C7, T3-4 to L, medial scap mm attachments tightness,  paraspinal L/S tightness L             [facility] Adult PT Treatment/Exercise - 05/01/23 1714       Therapeutic Activites    Other Therapeutic Activities cued for restorative yoga ( supine recline twist and legs propped)      Neuro Re-ed    Neuro Re-ed Details  cued for C/T stretches to realign spine      Modalities   Modalities Moist Heat      Moist Heat Therapy   Number Minutes Moist Heat 5 Minutes    Moist Heat Location --   legs propped , restorative pose explained, moist pack under back, unbilled               HOME EXERCISE PROGRAM: See pt instruction section    ASSESSMENT:  CLINICAL IMPRESSION:  Addressed L deviations of cervical/ thoracic segments with manual Tx which helped to realign shoulders and pelvis. Pt reported 90% improved pain at L hip post Tx.  Cued for stretches at neck and lumbar which will help with realignment of spine. Pt is compliant with work modifications  Regional interdependent approaches will yield greater benefits in pt's POC  Plan to address realignment of spine/ pelvis at next session to help promote optimize IAP system for improved pelvic floor function, trunk stability, gait, balance, stabilization with mobility tasks.  Plan to address pelvic floor issues once pelvis and  spine are realigned to yield better outcomes.   Pt benefits from skilled PT.    OBJECTIVE IMPAIRMENTS decreased activity tolerance, decreased coordination, decreased endurance, decreased mobility, difficulty walking, decreased ROM, decreased strength, decreased safety awareness, hypomobility, increased muscle spasms, impaired flexibility, improper body mechanics, postural dysfunction, and pain.   ACTIVITY LIMITATIONS  self-care,  home chores, work tasks    PARTICIPATION LIMITATIONS:  community, gym activities    PERSONAL FACTORS        are also affecting patient's functional outcome.    REHAB POTENTIAL: Good   CLINICAL DECISION MAKING: Evolving/moderate complexity   EVALUATION COMPLEXITY: Moderate    PATIENT EDUCATION:    Education details: Showed pt anatomy images. Explained muscles attachments/ connection, physiology of deep core system/ spinal- thoracic-pelvis-lower kinetic chain as they relate to pt's presentation, Sx, and past Hx. Explained what and how these areas of deficits need to be restored to balance and function    See Therapeutic activity / neuromuscular re-education section  Answered pt's questions.   Person educated: Patient Education method: Explanation, Demonstration, Tactile cues, Verbal cues, and Handouts Education comprehension: verbalized understanding, returned demonstration, verbal cues required, tactile cues required, and needs further education     PLAN: PT FREQUENCY: 1x/week   PT DURATION: 10 weeks   PLANNED INTERVENTIONS: Therapeutic exercises, Therapeutic activity, Neuromuscular re-education, Balance training, Gait training, Patient/Family education, Self Care, Joint mobilization, Spinal mobilization, Moist heat, Taping, and Manual therapy, dry needling.   PLAN FOR NEXT SESSION: See clinical impression for plan     GOALS: Goals reviewed with patient? Yes  SHORT TERM GOALS: Target date: 05/22/2023    Pt will demo IND with HEP                     Baseline: Not IND            Goal status: INITIAL   LONG TERM GOALS: Target date: 07/03/2023    1.Pt will demo proper deep core coordination without chest breathing and optimal excursion of diaphragm/pelvic floor in order to promote spinal stability and pelvic floor function  Baseline: dyscoordination Goal status: INITIAL  2.  Pt will demo proper body mechanics in against gravity tasks and ADLs  work tasks, fitness  to minimize straining pelvic floor / back    Baseline: not IND, improper form that places strain on pelvic floor  Goal status: INITIAL    3. Pt will demo increased gait speed > 1.3 m/s with reciprocal gait pattern, longer stride length  in order to ambulate safely in community and return to fitness routine  Baseline:  Goal status: INITIAL    4. Pt will report decreased leakage by < 50% of the time with  coughing , sneezing, laughing , and yelling with cheering for dtr's sport teams.  Baseline:   leakage with coughing , sneezing, laughing , and yelling with cheering for dtr's sport teams.  Goal  status: INITIAL   5. Pt will report able to complete urinate and not have to wait to finish across 100% of the time  Baseline: Pt feels she has to sit and there and pee again 30% of the time when she goes to pee . Goal status: INITIAL  6. Pt will demo levelled pelvic girdle and shoulder height in order to progress to deep core strengthening HEP and restore mobility at spine, pelvis, gait, posture minimize falls, and improve balance   Baseline: R shoulder and L iliac crest higher   Goal status: INITIAL   7. Pt will demo increased cervical ext 69 deg no pain, no flareup of neck pain across one month and compliance with avoid sleeping on belly in order to restore neck function for ADLs and work   Baseline:  pain with looking up, and flare ups can occur with sleeping with on her belly partially . Pt reports she can not sleep on her back but her neck is uncomfortable   Radiating pain occurs can occur down either arm to the pinky with flare ups once a month  Cervical ext 50 with pain   Goal Status:  INITIAL    8. Pt will report being on her feet all day, bending with no constant agitating pain level of 3/10 and no pain along ballmound of feet in order to perform work tasks   Baseline:after being on her feet all day and bending over and along the ballmounds of her feet and with work activities which requires lifting 20 lbs repeatedly and twist and turning .  Constant agitating pain level of 3/10  Goal Status:  INITIAL    9. Pt will report , 2 pts on PDFI-7 under bladder or urine category  Baseline: 4 pts  Goal status: INITIAL                           "  Mariane Masters, PT 05/01/2023, 5:42 PM

## 2023-05-04 ENCOUNTER — Encounter: Payer: Self-pay | Admitting: Obstetrics and Gynecology

## 2023-05-05 ENCOUNTER — Other Ambulatory Visit: Payer: Self-pay | Admitting: Obstetrics and Gynecology

## 2023-05-05 DIAGNOSIS — B009 Herpesviral infection, unspecified: Secondary | ICD-10-CM

## 2023-05-05 MED ORDER — VALACYCLOVIR HCL 1 G PO TABS: 1000.0000 mg | ORAL_TABLET | Freq: Every day | ORAL | 0 refills | Status: DC

## 2023-05-05 NOTE — Progress Notes (Signed)
 Rx RF valtrex till annuall.

## 2023-05-06 ENCOUNTER — Ambulatory Visit
Admission: RE | Admit: 2023-05-06 | Discharge: 2023-05-06 | Disposition: A | Discharge status: Home / Self Care | Payer: Commercial Managed Care - PPO | Source: Ambulatory Visit | Attending: Obstetrics and Gynecology | Admitting: Obstetrics and Gynecology

## 2023-05-06 DIAGNOSIS — Z1231 Encounter for screening mammogram for malignant neoplasm of breast: Secondary | ICD-10-CM | POA: Diagnosis present

## 2023-05-08 ENCOUNTER — Encounter: Payer: Self-pay | Admitting: Obstetrics and Gynecology

## 2023-05-08 ENCOUNTER — Ambulatory Visit: Payer: Commercial Managed Care - PPO | Admitting: Physical Therapy

## 2023-05-15 ENCOUNTER — Ambulatory Visit: Payer: Commercial Managed Care - PPO | Admitting: Physical Therapy

## 2023-05-20 ENCOUNTER — Ambulatory Visit (INDEPENDENT_AMBULATORY_CARE_PROVIDER_SITE_OTHER): Admitting: Obstetrics and Gynecology

## 2023-05-20 ENCOUNTER — Encounter: Payer: Self-pay | Admitting: Obstetrics and Gynecology

## 2023-05-20 VITALS — BP 114/70 | HR 66 | Ht 62.0 in | Wt 120.0 lb

## 2023-05-20 DIAGNOSIS — N946 Dysmenorrhea, unspecified: Secondary | ICD-10-CM

## 2023-05-20 DIAGNOSIS — Z3041 Encounter for surveillance of contraceptive pills: Secondary | ICD-10-CM

## 2023-05-20 DIAGNOSIS — Z01419 Encounter for gynecological examination (general) (routine) without abnormal findings: Secondary | ICD-10-CM

## 2023-05-20 DIAGNOSIS — B009 Herpesviral infection, unspecified: Secondary | ICD-10-CM

## 2023-05-20 DIAGNOSIS — Z1231 Encounter for screening mammogram for malignant neoplasm of breast: Secondary | ICD-10-CM

## 2023-05-20 MED ORDER — NORGESTIMATE-ETH ESTRADIOL 0.25-35 MG-MCG PO TABS: 1.0000 | ORAL_TABLET | Freq: Every day | ORAL | 3 refills | Status: DC

## 2023-05-20 MED ORDER — VALACYCLOVIR HCL 1 G PO TABS: 1000.0000 mg | ORAL_TABLET | Freq: Every day | ORAL | 3 refills | Status: AC

## 2023-05-20 NOTE — Patient Instructions (Signed)
 I value your feedback and you entrusting [place] with your care. If you get a [place] patient survey, I would appreciate you taking the time to let us know about your experience today. Thank you! ? ? ?

## 2023-05-20 NOTE — Progress Notes (Signed)
 PCP:  Dana Allan, MD   Chief Complaint  Patient presents with   Gynecologic Exam    No concerns     HPI:      Carolyn Johnston is a 43 y.o. G3P0010 whose LMP was No LMP recorded. (Menstrual status: Oral contraceptives)., presents today for her annual examination.  Her menses are absent with cont dosing of OCPs, no BTB; dysmen is mod to severe and improved with NSAIDs. Cont dosing OCPs doesn't affect migraines without aura. Has always had bad dysmen; did IUD in past and didn't like it.   Issues with SUI; doing pelvic PT recently, hasn't gotten to therapy for SUI yet.   Sex activity: currently sexually active, contraception - OCP (estrogen/progesterone). No pain/bleeding/dryness. Last Pap: 04/04/22 Results were: no abnormalities /neg HPV DNA  Hx of STDs: type 2 genital herpes; controlled with valtrex 1 g daily; has outbreaks with 500 mg dose. Needs Rx RF  Last mammo: 05/06/23 Results were normal, repeat in 12 months There is no FH of breast cancer. There is no FH of ovarian cancer. The patient does do self-breast exams.  Tobacco use: The patient denies current or previous tobacco use. Alcohol use: none No drug use.  Exercise: min active  She does get adequate calcium but not Vitamin D in her diet.  Hx of female pattern baldness, seeing derm with some improvement.  Hx of kidney stones with pyelonephritis 1/25. Doing better now.   Patient Active Problem List   Diagnosis Date Noted   Hospital discharge follow-up 03/23/2023   Positive blood culture 03/23/2023   Pyelonephritis 03/23/2023   SUI (stress urinary incontinence, female) 11/10/2022   Hordeolum externum of left upper eyelid 11/10/2022   Neck pain, chronic 07/27/2022   B12 deficiency 07/27/2022   Encounter for screening for HIV 07/27/2022   Need for hepatitis C screening test 07/27/2022   Abnormal glucose 07/27/2022   High serum cortisol 06/28/2021   Brittle nails 06/26/2021   Hair loss 03/24/2021   Birth  control counseling 03/24/2021   Herpes 03/24/2021   Vitamin D insufficiency 06/06/2016   Recurrent occipital headache 04/22/2016   Bilateral occipital neuralgia 04/22/2016   Cervico-occipital neuralgia of right side 04/22/2016   Cervico-occipital neuralgia of left side 04/22/2016   Chronic pain syndrome 04/22/2016   Vestibular migraine 06/01/2013    Past Surgical History:  Procedure Laterality Date   adnoids suture     DILATION AND CURETTAGE OF UTERUS     TONSILLECTOMY     WISDOM TOOTH EXTRACTION      Family History  Problem Relation Age of Onset   Arthritis Mother        RA since 27s   Headache Mother    Fibroids Mother    Rheum arthritis Mother    Drug abuse Father    Diabetes Maternal Grandmother    Parkinson's disease Maternal Grandfather     Social History   Socioeconomic History   Marital status: Single    Spouse name: Not on file   Number of children: Not on file   Years of education: Not on file   Highest education level: Associate degree: occupational, Scientist, product/process development, or vocational program  Occupational History   Not on file  Tobacco Use   Smoking status: Former    Current packs/day: 0.00    Types: Cigarettes    Quit date: 06/29/2008    Years since quitting: 14.8   Smokeless tobacco: Never  Vaping Use   Vaping status: Never Used  Substance and Sexual Activity   Alcohol use: No   Drug use: No   Sexual activity: Yes    Birth control/protection: Pill, Condom  Other Topics Concern   Not on file  Social History Narrative   Works  [employer]   2 daughter (11 and 62 as of 11/21/21)   Social Drivers of Health   Financial Resource Strain: Low Risk  (10/28/2022)   Overall Financial Resource Strain (CARDIA)    Difficulty of Paying Living Expenses: Not very hard  Food Insecurity: No Food Insecurity (10/28/2022)   Hunger Vital Sign    Worried About Running Out of Food in the Last Year: Never true    Ran Out of Food in the Last Year: Never true  Transportation Needs:  No Transportation Needs (10/28/2022)   PRAPARE - Administrator, Civil Service (Medical): No    Lack of Transportation (Non-Medical): No  Physical Activity: Insufficiently Active (10/28/2022)   Exercise Vital Sign    Days of Exercise per Week: 5 days    Minutes of Exercise per Session: 20 min  Stress: No Stress Concern Present (10/28/2022)   [employer] of Occupational Health - Occupational Stress Questionnaire    Feeling of Stress : Only a little  Social Connections: Moderately Integrated (10/28/2022)   Social Connection and Isolation Panel [NHANES]    Frequency of Communication with Friends and Family: More than three times a week    Frequency of Social Gatherings with Friends and Family: Twice a week    Attends Religious Services: 1 to 4 times per year    Active Member of [employer] or Organizations: Yes    Attends Banker Meetings: 1 to 4 times per year    Marital Status: Divorced  Catering manager Violence: Not on file     Current Outpatient Medications:    BOTOX 200 units injection, , Disp: , Rfl:    clindamycin-benzoyl peroxide (BENZACLIN) gel, Apply topically every morning., Disp: , Rfl:    Condoms - Female (FC2 FEMALE CONDOM) MISC, Place 1 Units vaginally daily as needed., Disp: 12 each, Rfl: 12   ketoconazole (NIZORAL) 2 % shampoo, Apply topically., Disp: , Rfl:    minoxidil (LONITEN) 2.5 MG tablet, Take 1 tablet (2.5 mg total) by mouth daily., Disp: 90 tablet, Rfl: 3   spironolactone (ALDACTONE) 50 MG tablet, Take 50 mg by mouth 2 (two) times daily., Disp: , Rfl:    tretinoin (RETIN-A) 0.1 % cream, SMARTSIG:Sparingly Topical, Disp: , Rfl:    norgestimate-ethinyl estradiol (SPRINTEC 28) 0.25-35 MG-MCG tablet, Take 1 tablet by mouth daily. CONTINUOUS DOSING, Disp: 120 tablet, Rfl: 3   valACYclovir (VALTREX) 1000 MG tablet, Take 1 tablet (1,000 mg total) by mouth daily., Disp: 90 tablet, Rfl: 3     ROS:  Review of Systems  Constitutional:  Negative for  fatigue, fever and unexpected weight change.  Respiratory:  Negative for cough, shortness of breath and wheezing.   Cardiovascular:  Negative for chest pain, palpitations and leg swelling.  Gastrointestinal:  Negative for blood in stool, constipation, diarrhea, nausea and vomiting.  Endocrine: Negative for cold intolerance, heat intolerance and polyuria.  Genitourinary:  Negative for dyspareunia, dysuria, flank pain, frequency, genital sores, hematuria, menstrual problem, pelvic pain, urgency, vaginal bleeding, vaginal discharge and vaginal pain.  Musculoskeletal:  Negative for back pain, joint swelling and myalgias.  Skin:  Negative for rash.  Neurological:  Negative for dizziness, syncope, light-headedness, numbness and headaches.  Hematological:  Negative for adenopathy.  Psychiatric/Behavioral:  Negative for agitation, confusion, sleep disturbance and suicidal ideas. The patient is not nervous/anxious.    BREAST: No symptoms   Objective: BP 114/70   Pulse 66   Ht 5\' 2"  (1.575 m)   Wt 120 lb (54.4 kg)   BMI 21.95 kg/m    Physical Exam Constitutional:      Appearance: She is well-developed.  Genitourinary:     Vulva normal.     Right Labia: No rash, tenderness or lesions.    Left Labia: No tenderness, lesions or rash.    No vaginal discharge, erythema or tenderness.      Right Adnexa: not tender and no mass present.    Left Adnexa: not tender and no mass present.    No cervical friability or polyp.     Uterus is not enlarged or tender.  Breasts:    Right: No mass, nipple discharge, skin change or tenderness.     Left: No mass, nipple discharge, skin change or tenderness.  Neck:     Thyroid: No thyromegaly.  Cardiovascular:     Rate and Rhythm: Normal rate and regular rhythm.     Heart sounds: Normal heart sounds. No murmur heard. Pulmonary:     Effort: Pulmonary effort is normal.     Breath sounds: Normal breath sounds.  Abdominal:     Palpations: Abdomen is soft.      Tenderness: There is no abdominal tenderness. There is no guarding or rebound.  Musculoskeletal:        General: Normal range of motion.     Cervical back: Normal range of motion.  Lymphadenopathy:     Cervical: No cervical adenopathy.  Neurological:     General: No focal deficit present.     Mental Status: She is alert and oriented to person, place, and time.     Cranial Nerves: No cranial nerve deficit.  Skin:    General: Skin is warm and dry.  Psychiatric:        Mood and Affect: Mood normal.        Behavior: Behavior normal.        Thought Content: Thought content normal.        Judgment: Judgment normal.  Vitals reviewed.     Assessment/Plan: Encounter for annual routine gynecological examination  Encounter for surveillance of contraceptive pills - Plan: norgestimate-ethinyl estradiol (SPRINTEC 28) 0.25-35 MG-MCG tablet; OCP RF eRxd.   Dysmenorrhea - Plan: norgestimate-ethinyl estradiol (SPRINTEC 28) 0.25-35 MG-MCG tablet; Rx RF cont dosing OCPs, discussed depo and IUD as options for possible amenorrhea and to improve dysmen. Pt to consider.   Encounter for screening mammogram for malignant neoplasm of breast; pt current on mammo  Herpes - Plan: valACYclovir (VALTREX) 1000 MG tablet; Rx RF eRxd.    Meds ordered this encounter  Medications   norgestimate-ethinyl estradiol (SPRINTEC 28) 0.25-35 MG-MCG tablet    Sig: Take 1 tablet by mouth daily. CONTINUOUS DOSING    Dispense:  120 tablet    Refill:  3    Supervising Provider:   Hildred Laser [[id number]]   valACYclovir (VALTREX) 1000 MG tablet    Sig: Take 1 tablet (1,000 mg total) by mouth daily.    Dispense:  90 tablet    Refill:  3    Supervising Provider:   Waymon Budge             GYN counsel adequate intake of calcium and vitamin D, diet and exercise     F/U  Return in  about 1 year (around 05/19/2024).  Carolyn Hagarty B. Sumeya Yontz, PA-C 05/20/2023 4:32 PM

## 2023-05-22 ENCOUNTER — Ambulatory Visit: Payer: Commercial Managed Care - PPO | Admitting: Physical Therapy

## 2023-05-29 ENCOUNTER — Ambulatory Visit: Payer: Commercial Managed Care - PPO | Attending: Family Medicine | Admitting: Physical Therapy

## 2023-05-29 ENCOUNTER — Ambulatory Visit: Payer: Commercial Managed Care - PPO | Admitting: Physical Therapy

## 2023-05-29 DIAGNOSIS — M5459 Other low back pain: Secondary | ICD-10-CM | POA: Insufficient documentation

## 2023-05-29 DIAGNOSIS — R2689 Other abnormalities of gait and mobility: Secondary | ICD-10-CM | POA: Insufficient documentation

## 2023-05-29 DIAGNOSIS — M542 Cervicalgia: Secondary | ICD-10-CM | POA: Insufficient documentation

## 2023-05-29 DIAGNOSIS — M533 Sacrococcygeal disorders, not elsewhere classified: Secondary | ICD-10-CM | POA: Insufficient documentation

## 2023-05-29 NOTE — Therapy (Unsigned)
 OUTPATIENT PHYSICAL THERAPY TREATMENT    Patient Name: Carolyn Johnston MRN: [id number] DOB:04-30-1980, 43 y.o., female Today's Date: 05/29/2023   PT End of Session - 05/29/23 1610     Visit Number 3    Number of Visits 10    Date for PT Re-Evaluation 07/03/23    PT Start Time 1606    PT Stop Time 1650    PT Time Calculation (min) 44 min    Activity Tolerance No increased pain;Patient tolerated treatment well    Behavior During Therapy WFL for tasks assessed/performed             Past Medical History:  Diagnosis Date   Allergy    trees, dust mites   COVID-19    x2   Migraines    Past Surgical History:  Procedure Laterality Date   adnoids suture     DILATION AND CURETTAGE OF UTERUS     TONSILLECTOMY     WISDOM TOOTH EXTRACTION     Patient Active Problem List   Diagnosis Date Noted   Hospital discharge follow-up 03/23/2023   Positive blood culture 03/23/2023   Pyelonephritis 03/23/2023   SUI (stress urinary incontinence, female) 11/10/2022   Hordeolum externum of left upper eyelid 11/10/2022   Neck pain, chronic 07/27/2022   B12 deficiency 07/27/2022   Encounter for screening for HIV 07/27/2022   Need for hepatitis C screening test 07/27/2022   Abnormal glucose 07/27/2022   High serum cortisol 06/28/2021   Brittle nails 06/26/2021   Hair loss 03/24/2021   Birth control counseling 03/24/2021   Herpes 03/24/2021   Vitamin D insufficiency 06/06/2016   Recurrent occipital headache 04/22/2016   Bilateral occipital neuralgia 04/22/2016   Cervico-occipital neuralgia of right side 04/22/2016   Cervico-occipital neuralgia of left side 04/22/2016   Chronic pain syndrome 04/22/2016   Vestibular migraine 06/01/2013    PCP: Clent Ridges   REFERRING PROVIDER: Meribeth Mattes DIAG: Urinary stress incontinence   Rationale for Evaluation and Treatment Rehabilitation  THERAPY DIAG:   Sacrococcygeal pain  Other abnormalities of gait and mobility  Neck  pain  Other low back pain  ONSET DATE:   SUBJECTIVE:                   SUBJECTIVE STATEMENT TODAY:                      Pt reported she has more range of motion in her neck but still feels 2/10 pain with more L rotation                                                                                                                                                        SUBJECTIVE STATEMENT ON EVAL 04/24/23 : 1) urinary leakage:  leakage with coughing , sneezing,  laughing , and yelling with cheering for dtr's sport teams. Does not wear pads.  Able to make it to the bathroom without leakage.  Nocturia 1x/ night.    2) incomplete emptying urine:  Pt feels she has to sit and there and pee again 30% of the time when she goes to pee . Pt has felt a heavier feeling her vagina with sitting and standing and with coughing, yelling   3) neck pain:   when she has a flare-up with neck pain 7/10 , normal neck pain 4/10 , pain with looking up, and flare ups can occur with sleeping with on her belly partially . Pt reports she can not sleep on her back but her neck is uncomfortable . Radiating pain occurs can occur down either arm to the pinky.     4) low back pain: after being on her feet all day and bending over and along the ballmounds of her feet and with work activities which requires lifting 20 lbs repeatedly and twist and turning .  Constant agitating pain level of 3/10 . Non radiating pain  . Located across L/S  line. Pain started in her early 60s.                 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 Yes: see above   PRECAUTIONS: No   WEIGHT BEARING  RESTRICTIONS: No   FALLS:  Has patient fallen in last 6 months? 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 Pt reported 90% improved pain at L hip post Tx.  Cued for stretches at neck and lumbar which will help with realignment of spine. Pt is compliant with work modifications  Regional interdependent approaches will yield greater benefits in pt's POC  Plan to address realignment of spine/ pelvis at next session to help promote optimize IAP system for improved pelvic floor function, trunk stability, gait, balance, stabilization with mobility tasks.  Plan to address pelvic floor issues once pelvis and spine are realigned to yield better outcomes.   Pt benefits from skilled PT.    OBJECTIVE IMPAIRMENTS decreased activity tolerance, decreased coordination, decreased endurance, decreased mobility, difficulty walking, decreased ROM, decreased strength, decreased safety awareness, hypomobility, increased muscle spasms, impaired flexibility, improper body mechanics, postural dysfunction, and pain.   ACTIVITY LIMITATIONS  self-care,  home chores, work tasks    PARTICIPATION LIMITATIONS:  community, gym activities    PERSONAL FACTORS        are also affecting patient's functional outcome.    REHAB POTENTIAL:  Good   CLINICAL DECISION MAKING: Evolving/moderate complexity   EVALUATION COMPLEXITY: Moderate    PATIENT EDUCATION:    Education details: Showed pt anatomy images.  Explained muscles attachments/ connection, physiology of deep core system/ spinal- thoracic-pelvis-lower kinetic chain as they relate to pt's presentation, Sx, and past Hx. Explained what and how these areas of deficits need to be restored to balance and function    See Therapeutic activity / neuromuscular re-education section  Answered pt's questions.   Person educated: Patient Education method: Explanation, Demonstration, Tactile cues, Verbal cues, and Handouts Education comprehension: verbalized understanding, returned demonstration, verbal cues required, tactile cues required, and needs further education     PLAN: PT FREQUENCY: 1x/week   PT DURATION: 10 weeks   PLANNED INTERVENTIONS: Therapeutic exercises, Therapeutic activity, Neuromuscular re-education, Balance training, Gait training, Patient/Family education, Self Care, Joint mobilization, Spinal mobilization, Moist heat, Taping, and Manual therapy, dry needling.   PLAN FOR NEXT SESSION: See clinical impression for plan     GOALS: Goals reviewed with patient? Yes  SHORT TERM GOALS: Target date: 05/22/2023    Pt will demo IND with HEP                    Baseline: Not IND            Goal status: INITIAL   LONG TERM GOALS: Target date: 07/03/2023    1.Pt will demo proper deep core coordination without chest breathing and optimal excursion of diaphragm/pelvic floor in order to promote spinal stability and pelvic floor function  Baseline: dyscoordination Goal status: INITIAL  2.  Pt will demo proper body mechanics in against gravity tasks and ADLs  work tasks, fitness  to minimize straining pelvic floor / back    Baseline: not IND, improper form that places strain on pelvic floor  Goal status: INITIAL    3. Pt will demo increased gait speed > 1.3 m/s with reciprocal gait pattern, longer stride length  in order to ambulate safely in community and return to fitness routine  Baseline:  Goal status: INITIAL    4. Pt  will report decreased leakage by < 50% of the time with  coughing , sneezing, laughing , and yelling with cheering for dtr's sport teams.  Baseline:   leakage with coughing , sneezing, laughing , and yelling with cheering for dtr's sport teams.  Goal status: INITIAL   5. Pt will report able to complete urinate and not have to wait to finish across 100% of the time  Baseline: Pt feels she has to sit and there and pee again 30% of the time when she goes to pee . Goal status: INITIAL  6. Pt will demo levelled pelvic girdle and shoulder height in order to progress to deep core strengthening HEP and restore mobility at spine, pelvis, gait, posture minimize falls, and improve balance   Baseline: R shoulder and L iliac crest higher   Goal status: INITIAL   7. Pt will demo increased cervical ext 69 deg no pain, no flareup of neck pain across one month and compliance with avoid sleeping on belly in order to restore neck function for ADLs and work   Baseline:  pain with looking up, and flare ups can occur with sleeping with on her belly partially . Pt reports she can not sleep on her back but her neck is uncomfortable  Radiating pain occurs can occur down either arm to the pinky with flare ups once a month  Cervical ext  50 with pain   Goal Status:  INITIAL    8. Pt will report being on her feet all day, bending with no constant agitating pain level of 3/10 and no pain along ballmound of feet in order to perform work tasks   Baseline:after being on her feet all day and bending over and along the ballmounds of her feet and with work activities which requires lifting 20 lbs repeatedly and twist and turning .  Constant agitating pain level of 3/10  Goal Status:  INITIAL    9. Pt will report , 2 pts on PDFI-7 under bladder or urine category  Baseline: 4 pts  Goal status: INITIAL                           "  Mariane Masters, PT 05/29/2023, 4:11 PM

## 2023-06-05 ENCOUNTER — Ambulatory Visit: Payer: Commercial Managed Care - PPO | Admitting: Physical Therapy

## 2023-06-10 ENCOUNTER — Encounter: Payer: Self-pay | Admitting: Physical Therapy

## 2023-06-12 ENCOUNTER — Ambulatory Visit: Payer: Commercial Managed Care - PPO | Admitting: Physical Therapy

## 2023-06-19 ENCOUNTER — Ambulatory Visit: Payer: Commercial Managed Care - PPO | Attending: Family Medicine | Admitting: Physical Therapy

## 2023-06-19 DIAGNOSIS — M542 Cervicalgia: Secondary | ICD-10-CM | POA: Insufficient documentation

## 2023-06-19 DIAGNOSIS — M5459 Other low back pain: Secondary | ICD-10-CM | POA: Diagnosis present

## 2023-06-19 DIAGNOSIS — R2689 Other abnormalities of gait and mobility: Secondary | ICD-10-CM | POA: Insufficient documentation

## 2023-06-19 DIAGNOSIS — M533 Sacrococcygeal disorders, not elsewhere classified: Secondary | ICD-10-CM | POA: Insufficient documentation

## 2023-06-19 NOTE — Therapy (Signed)
 OUTPATIENT PHYSICAL THERAPY TREATMENT    Patient Name: Carolyn Johnston MRN: [id number] DOB:May 21, 1980, 43 y.o., female Today's Date: 06/19/2023   PT End of Session - 06/19/23 1610     Visit Number 4    Number of Visits 10    Date for PT Re-Evaluation 07/03/23    PT Start Time 1600    PT Stop Time 1656    PT Time Calculation (min) 56 min    Activity Tolerance No increased pain;Patient tolerated treatment well    Behavior During Therapy [facility] for tasks assessed/performed             Past Medical History:  Diagnosis Date   Allergy    trees, dust mites   COVID-19    x2   Migraines    Past Surgical History:  Procedure Laterality Date   adnoids suture     DILATION AND CURETTAGE OF UTERUS     TONSILLECTOMY     WISDOM TOOTH EXTRACTION     Patient Active Problem List   Diagnosis Date Noted   Hospital discharge follow-up 03/23/2023   Positive blood culture 03/23/2023   Pyelonephritis 03/23/2023   SUI (stress urinary incontinence, female) 11/10/2022   Hordeolum externum of left upper eyelid 11/10/2022   Neck pain, chronic 07/27/2022   B12 deficiency 07/27/2022   Encounter for screening for HIV 07/27/2022   Need for hepatitis C screening test 07/27/2022   Abnormal glucose 07/27/2022   High serum cortisol 06/28/2021   Brittle nails 06/26/2021   Hair loss 03/24/2021   Birth control counseling 03/24/2021   Herpes 03/24/2021   Vitamin D  insufficiency 06/06/2016   Recurrent occipital headache 04/22/2016   Bilateral occipital neuralgia 04/22/2016   Cervico-occipital neuralgia of right side 04/22/2016   Cervico-occipital neuralgia of left side 04/22/2016   Chronic pain syndrome 04/22/2016   Vestibular migraine 06/01/2013    PCP: Sueanne Emerald   REFERRING PROVIDER: Artemisa Bile DIAG: Urinary stress incontinence   Rationale for Evaluation and Treatment Rehabilitation  THERAPY DIAG:   Sacrococcygeal pain  Other abnormalities of gait and mobility  Neck pain  Other  low back pain  ONSET DATE:   SUBJECTIVE:                   SUBJECTIVE STATEMENT TODAY:                      Pt reported she has more range of motion in her neck but still feels smaller location of tenderness. Pt feels leakage is slightly better                                                                                            SUBJECTIVE STATEMENT ON EVAL 04/24/23 : 1) urinary leakage:  leakage with coughing , sneezing, laughing , and yelling with cheering for dtr's sport teams. Does not wear pads.  Able to make it to the bathroom without leakage.  Nocturia 1x/ night.    2) incomplete emptying urine:  Pt feels she has to sit and there and pee again 30% of the time when she goes to pee .  Pt has felt a heavier feeling her vagina with sitting and standing and with coughing, yelling   3) neck pain:   when she has a flare-up with neck pain 7/10 , normal neck pain 4/10 , pain with looking up, and flare ups can occur with sleeping with on her belly partially . Pt reports she can not sleep on her back but her neck is uncomfortable . Radiating pain occurs can occur down either arm to the pinky.     4) low back pain: after being on her feet all day and bending over and along the ballmounds of her feet and with work activities which requires lifting 20 lbs repeatedly and twist and turning .  Constant agitating pain level of 3/10 . Non radiating pain  . Located across L/S  line. Pain started in her early 73s.                 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 Yes: see above   PRECAUTIONS: No   WEIGHT BEARING RESTRICTIONS: No   FALLS:  Has  patient fallen in last 6 months? 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)    PLOF: IND   PATIENT GOALS:  Improve skills to do at home help relieve pain and discomfort and retrain her bladder    OBJECTIVE:   [facility] PT Assessment - 06/19/23 1612       AROM   Overall AROM Comments cervical sideflexion: B 50 deg, ext 55 deg,   rotation B 50 deg      Palpation   Palpation comment tenderness at T5 R and along spine of scapula, hypomobile T12-L1 intercostals, hypomobile R occiput             [facility] Adult PT Treatment/Exercise - 06/19/23 1651       Therapeutic Activites    Other Therapeutic Activities explained the importance to continue to not sleep on belly to maintain alignment of C/T/L spine.  Today's finding of R deviated C/T segments will help her TMJ issues as well . Explained future sessions to address urinary issues and will add cervico-thoracic strengthening      Neuro Re-ed    Neuro Re-ed Details  cued for legs up the wall / propped to allow for relaxation after work to encourage blood flow to pelvis and relax neck and spine, cued for cerivcoscapular strengthening with low ab engagement f( dolphin plank)      Modalities   Modalities Moist Heat      Moist Heat Therapy   Number Minutes Moist Heat 5 Minutes    Moist Heat Location --   unbilled under cervical with leggs propped up     Manual Therapy   Manual therapy comments distraction, gentle STM, MWM to promote mobility at C/T junction                HOME EXERCISE PROGRAM: See pt instruction section  ASSESSMENT:  CLINICAL IMPRESSION:  Pt presents with more upright posture and no more rounded shoulders, and more levelled pelvic girdle/ shoulders.Pt regained cervical ext , B rotation, sideflexion.   Further addressed L deviations of cervical/  thoracic segments with manual Tx again at today's visit.       Added cervical/ scapular stabilization and strengthening today with cues for correct technique and more cervico-scapular stabilization. Cued for legs up the wall / propped to allow for relaxation after work to encourage blood flow to pelvis and relax neck and spine     Explained future sessions to address urinary issues and will add cervico-thoracic strengthening   Regional interdependent approaches will yield greater benefits in pt's POC    Plan to address pelvic floor issues at upcoming sessions.  Pt benefits from skilled PT.    OBJECTIVE IMPAIRMENTS decreased activity tolerance, decreased coordination, decreased endurance, decreased mobility, difficulty walking, decreased ROM, decreased strength, decreased safety awareness, hypomobility, increased muscle spasms, impaired flexibility, improper body mechanics, postural dysfunction, and pain.   ACTIVITY LIMITATIONS  self-care,  home chores, work tasks    PARTICIPATION LIMITATIONS:  community, gym activities    PERSONAL FACTORS        are also affecting patient's functional outcome.    REHAB POTENTIAL: Good   CLINICAL DECISION MAKING: Evolving/moderate complexity   EVALUATION COMPLEXITY: Moderate    PATIENT EDUCATION:    Education details: Showed pt anatomy images. Explained muscles attachments/ connection, physiology of deep core system/ spinal- thoracic-pelvis-lower kinetic chain as they relate to pt's presentation, Sx, and past Hx. Explained what and how these areas of deficits need to be restored to balance and function    See Therapeutic activity / neuromuscular re-education section  Answered pt's questions.   Person educated: Patient Education method: Explanation, Demonstration, Tactile cues, Verbal cues, and Handouts Education comprehension: verbalized understanding, returned demonstration, verbal cues required, tactile cues required, and needs further  education     PLAN: PT FREQUENCY: 1x/week   PT DURATION: 10 weeks   PLANNED INTERVENTIONS: Therapeutic exercises, Therapeutic activity, Neuromuscular re-education, Balance training, Gait training, Patient/Family education, Self Care, Joint mobilization, Spinal mobilization, Moist heat, Taping, and Manual therapy, dry needling.   PLAN FOR NEXT SESSION: See clinical impression for plan     GOALS: Goals reviewed with patient? Yes  SHORT TERM GOALS: Target date: 05/22/2023    Pt will demo IND with HEP                    Baseline: Not IND            Goal status: INITIAL   LONG TERM GOALS: Target date: 07/03/2023    1.Pt will demo proper deep core coordination without chest breathing and optimal excursion of diaphragm/pelvic floor in order to promote spinal stability and pelvic floor function  Baseline: dyscoordination Goal status: INITIAL  2.  Pt will demo proper body mechanics in against gravity tasks and ADLs  work tasks, fitness  to minimize straining pelvic floor / back    Baseline: not IND, improper form that places strain on pelvic floor  Goal status: INITIAL    3. Pt will demo increased gait speed > 1.3 m/s with reciprocal gait pattern, longer stride length  in order to ambulate safely in community and return to fitness routine  Baseline:  Goal status: INITIAL    4. Pt will report decreased leakage by < 50% of the time with  coughing , sneezing, laughing , and yelling with  cheering for dtr's sport teams.  Baseline:   leakage with coughing , sneezing, laughing , and yelling with cheering for dtr's sport teams.  Goal status: INITIAL   5. Pt will report able to complete urinate and not have to wait to finish across 100% of the time  Baseline: Pt feels she has to sit and there and pee again 30% of the time when she goes to pee . Goal status: INITIAL  6. Pt will demo levelled pelvic girdle and shoulder height in order to progress to deep core strengthening HEP and  restore mobility at spine, pelvis, gait, posture minimize falls, and improve balance   Baseline: R shoulder and L iliac crest higher   Goal status: INITIAL   7. Pt will demo increased cervical ext 69 deg no pain, no flareup of neck pain across one month and compliance with avoid sleeping on belly in order to restore neck function for ADLs and work   Baseline:  pain with looking up, and flare ups can occur with sleeping with on her belly partially . Pt reports she can not sleep on her back but her neck is uncomfortable  Radiating pain occurs can occur down either arm to the pinky with flare ups once a month  Cervical ext 50 with pain   Goal Status:  INITIAL    8. Pt will report being on her feet all day, bending with no constant agitating pain level of 3/10 and no pain along ballmound of feet in order to perform work tasks   Baseline:after being on her feet all day and bending over and along the ballmounds of her feet and with work activities which requires lifting 20 lbs repeatedly and twist and turning .  Constant agitating pain level of 3/10  Goal Status:  INITIAL    9. Pt will report , 2 pts on PDFI-7 under bladder or urine category  Baseline: 4 pts  Goal status: INITIAL                           "  Modesto Andreas, PT 06/19/2023, 5:15 PM

## 2023-06-19 NOTE — Patient Instructions (Addendum)
 Dolphin squats on wall   Fingers interlaced, elbows shoulder width apart, forearms in a triangle pressing against the wall Mini squat position   Inhale, exhale chin tucked, shoulders lengthen down from ears, as you rise up not locking knees, hairline brushes by thumbs ( "like dolphin snout diving up out of the water"  20 reps.  Make sure to not let the front ribs flare out, ribs over the pelvis aligned to not have a swayed back  Pressure through ballmounds to rise up, and dont lock the knees   __  Leggs propped up on pilows / wall  To relax after work :restorative yoga  10 min

## 2023-06-26 ENCOUNTER — Ambulatory Visit: Payer: Commercial Managed Care - PPO | Admitting: Physical Therapy

## 2023-06-26 DIAGNOSIS — M533 Sacrococcygeal disorders, not elsewhere classified: Secondary | ICD-10-CM

## 2023-06-26 DIAGNOSIS — M5459 Other low back pain: Secondary | ICD-10-CM

## 2023-06-26 DIAGNOSIS — R2689 Other abnormalities of gait and mobility: Secondary | ICD-10-CM

## 2023-06-26 NOTE — Therapy (Signed)
 OUTPATIENT PHYSICAL THERAPY TREATMENT    Patient Name: Carolyn Johnston MRN: [id number] DOB:06-05-1980, 43 y.o., female Today's Date: 06/26/2023   PT End of Session - 06/26/23 1719     Visit Number 5    Number of Visits 10    Date for PT Re-Evaluation 07/03/23    PT Start Time 1634    PT Stop Time 1715    PT Time Calculation (min) 41 min    Activity Tolerance No increased pain;Patient tolerated treatment well    Behavior During Therapy [facility] for tasks assessed/performed             Past Medical History:  Diagnosis Date   Allergy    trees, dust mites   COVID-19    x2   Migraines    Past Surgical History:  Procedure Laterality Date   adnoids suture     DILATION AND CURETTAGE OF UTERUS     TONSILLECTOMY     WISDOM TOOTH EXTRACTION     Patient Active Problem List   Diagnosis Date Noted   Hospital discharge follow-up 03/23/2023   Positive blood culture 03/23/2023   Pyelonephritis 03/23/2023   SUI (stress urinary incontinence, female) 11/10/2022   Hordeolum externum of left upper eyelid 11/10/2022   Neck pain, chronic 07/27/2022   B12 deficiency 07/27/2022   Encounter for screening for HIV 07/27/2022   Need for hepatitis C screening test 07/27/2022   Abnormal glucose 07/27/2022   High serum cortisol 06/28/2021   Brittle nails 06/26/2021   Hair loss 03/24/2021   Birth control counseling 03/24/2021   Herpes 03/24/2021   Vitamin D  insufficiency 06/06/2016   Recurrent occipital headache 04/22/2016   Bilateral occipital neuralgia 04/22/2016   Cervico-occipital neuralgia of right side 04/22/2016   Cervico-occipital neuralgia of left side 04/22/2016   Chronic pain syndrome 04/22/2016   Vestibular migraine 06/01/2013    PCP: Sueanne Emerald   REFERRING PROVIDER: Artemisa Bile DIAG: Urinary stress incontinence   Rationale for Evaluation and Treatment Rehabilitation  THERAPY DIAG:   Sacrococcygeal pain  Other abnormalities of gait and mobility  Neck pain  Other  low back pain  ONSET DATE:   SUBJECTIVE:                   SUBJECTIVE STATEMENT TODAY:                      Pt reported she has been doing her exercises                                                                                        SUBJECTIVE STATEMENT ON EVAL 04/24/23 : 1) urinary leakage:  leakage with coughing , sneezing, laughing , and yelling with cheering for dtr's sport teams. Does not wear pads.  Able to make it to the bathroom without leakage.  Nocturia 1x/ night.    2) incomplete emptying urine:  Pt feels she has to sit and there and pee again 30% of the time when she goes to pee . Pt has felt a heavier feeling her vagina with sitting and standing and with coughing, yelling   3)  neck pain:   when she has a flare-up with neck pain 7/10 , normal neck pain 4/10 , pain with looking up, and flare ups can occur with sleeping with on her belly partially . Pt reports she can not sleep on her back but her neck is uncomfortable . Radiating pain occurs can occur down either arm to the pinky.     4) low back pain: after being on her feet all day and bending over and along the ballmounds of her feet and with work activities which requires lifting 20 lbs repeatedly and twist and turning .  Constant agitating pain level of 3/10 . Non radiating pain  . Located across L/S  line. Pain started in her early 81s.                 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 Yes: see above   PRECAUTIONS: No   WEIGHT BEARING RESTRICTIONS: No   FALLS:  Has patient fallen in last 6 months? 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)    PLOF: IND   PATIENT GOALS:  Improve skills to do at home help relieve pain and discomfort and retrain her bladder    OBJECTIVE:   [facility] PT Assessment - 06/26/23 1716       Coordination   Coordination and Movement Description deep core coordination without upper trap overuse , pelvic in posterior tilt      Palpation   Palpation comment no more tenderness/ deviations at C/T spine,             [facility] Adult PT Treatment/Exercise - 06/26/23 1717       Self-Care   Self-Care Other Self-Care Comments    Other Self-Care Comments  discussed to avoid flipflops to minimize tight feet affecting tight pelvic floor mm,explained toe abduction and transverse arches in deep core HEP and new HEP to help with stability , relaxation of pelvic floor , explained alignment with anterior tilt of pelvis to promote position of bladder      Neuro Re-ed    Neuro Re-ed Details  cued for deep core level 1-2, new cervico/scapular stabilization resistance band HEP   Cued for anterior tilt of pelvis and co activation of feet      Exercises   Exercises Other Exercises    Other Exercises  see pt instructions                HOME EXERCISE PROGRAM: See pt instruction section    ASSESSMENT:  CLINICAL IMPRESSION:  Pt presents with more upright posture and no more rounded shoulders, and more levelled pelvic girdle/ shoulders.Pt regained cervical ext , B rotation, sideflexion and today, showed no more deviations/ tenderness/ hypomobility along C/T spine.      Added cervical/ scapular stabilization with blue resistance band today with cues for correct technique and more cervico-scapular stabilization.      Progressed to deep core  training with minor cues. Anticipate these HEP will help pt maintain positive improvements in pt's spine and pelvic alignment.  Discussed to avoid flipflops to minimize tight feet affecting tight pelvic floor mm,explained toe abduction and transverse arches in deep core HEP and new HEP to help with stability , relaxation of pelvic floor , explained alignment with anterior tilt of pelvis to promote position of bladder.  Cued for coactivation of feet with deep core system and cued for anterior tilt of pelvis to promote bladder position to help with continence. Pt demo'd correctly post training.    Regional interdependent approaches will yield greater benefits in pt's POC    Plan to address pelvic floor issues at upcoming sessions.  Pt benefits from skilled PT.    OBJECTIVE IMPAIRMENTS decreased activity tolerance, decreased coordination, decreased endurance, decreased mobility, difficulty walking, decreased ROM, decreased strength, decreased safety awareness, hypomobility, increased muscle spasms, impaired flexibility, improper body mechanics, postural dysfunction, and pain.   ACTIVITY LIMITATIONS  self-care,  home chores, work tasks    PARTICIPATION LIMITATIONS:  community, gym activities    PERSONAL FACTORS        are also affecting patient's functional outcome.    REHAB POTENTIAL: Good   CLINICAL DECISION MAKING: Evolving/moderate complexity   EVALUATION COMPLEXITY: Moderate    PATIENT EDUCATION:    Education details: Showed pt anatomy images. Explained muscles attachments/ connection, physiology of deep core system/ spinal- thoracic-pelvis-lower kinetic chain as they relate to pt's presentation, Sx, and past Hx. Explained what and how these areas of deficits need to be restored to balance and function    See Therapeutic activity / neuromuscular re-education section  Answered pt's questions.   Person educated: Patient Education method: Explanation, Demonstration, Tactile cues, Verbal cues, and Handouts Education comprehension: verbalized understanding, returned demonstration, verbal cues required, tactile  cues required, and needs further education     PLAN: PT FREQUENCY: 1x/week   PT DURATION: 10 weeks   PLANNED INTERVENTIONS: Therapeutic exercises, Therapeutic activity, Neuromuscular re-education, Balance training, Gait training, Patient/Family education, Self Care, Joint mobilization, Spinal mobilization, Moist heat, Taping, and Manual therapy, dry needling.   PLAN FOR NEXT SESSION: See clinical impression for plan     GOALS: Goals reviewed with patient? Yes  SHORT TERM GOALS: Target date: 05/22/2023    Pt will demo IND with HEP                    Baseline: Not IND            Goal status: INITIAL   LONG TERM GOALS: Target date: 07/03/2023    1.Pt will demo proper deep core coordination without chest breathing and optimal excursion of diaphragm/pelvic floor in order to promote spinal stability and pelvic floor function  Baseline: dyscoordination Goal status: INITIAL  2.  Pt will demo proper body mechanics in against gravity tasks and ADLs  work tasks, fitness  to minimize straining pelvic floor / back    Baseline: not IND, improper form that places strain on pelvic floor  Goal status: INITIAL    3. Pt will demo increased gait speed > 1.3 m/s with reciprocal gait pattern, longer stride length  in order to ambulate safely in community and return to fitness routine  Baseline:  Goal status: INITIAL    4. Pt will report decreased leakage by < 50% of the time with  coughing , sneezing, laughing , and yelling with cheering for dtr's sport teams.  Baseline:   leakage with coughing ,  sneezing, laughing , and yelling with cheering for dtr's sport teams.  Goal status: INITIAL   5. Pt will report able to complete urinate and not have to wait to finish across 100% of the time  Baseline: Pt feels she has to sit and there and pee again 30% of the time when she goes to pee . Goal status: INITIAL  6. Pt will demo levelled pelvic girdle and shoulder height in order to progress to  deep core strengthening HEP and restore mobility at spine, pelvis, gait, posture minimize falls, and improve balance   Baseline: R shoulder and L iliac crest higher   Goal status: INITIAL   7. Pt will demo increased cervical ext 69 deg no pain, no flareup of neck pain across one month and compliance with avoid sleeping on belly in order to restore neck function for ADLs and work   Baseline:  pain with looking up, and flare ups can occur with sleeping with on her belly partially . Pt reports she can not sleep on her back but her neck is uncomfortable  Radiating pain occurs can occur down either arm to the pinky with flare ups once a month  Cervical ext 50 with pain   Goal Status:  INITIAL    8. Pt will report being on her feet all day, bending with no constant agitating pain level of 3/10 and no pain along ballmound of feet in order to perform work tasks   Baseline:after being on her feet all day and bending over and along the ballmounds of her feet and with work activities which requires lifting 20 lbs repeatedly and twist and turning .  Constant agitating pain level of 3/10  Goal Status:  INITIAL    9. Pt will report , 2 pts on PDFI-7 under bladder or urine category  Baseline: 4 pts  Goal status: INITIAL                           "  Modesto Andreas, PT 06/26/2023, 5:21 PM

## 2023-06-26 NOTE — Patient Instructions (Signed)
 Place band in "U"    band under ballmounds  while laying on back w/ knees bent   Lying on back, knees bent    band under ballmounds  while laying on back w/ knees bent  "W" exercise  30 reps   Band is placed under feet, knees bent, feet are hip width apart Hold band with thumbs point out, keep upper arm and elbow touching the bed the whole time  - inhale and then exhale pull bands by bending elbows hands move in a "w"  (feel shoulder blades squeezing)   _______________  Deep core level 1-2 (handout)  twice  a day

## 2023-07-03 ENCOUNTER — Ambulatory Visit: Payer: Commercial Managed Care - PPO | Admitting: Physical Therapy

## 2023-07-03 DIAGNOSIS — R2689 Other abnormalities of gait and mobility: Secondary | ICD-10-CM

## 2023-07-03 DIAGNOSIS — M5459 Other low back pain: Secondary | ICD-10-CM | POA: Diagnosis not present

## 2023-07-03 DIAGNOSIS — M533 Sacrococcygeal disorders, not elsewhere classified: Secondary | ICD-10-CM

## 2023-07-03 NOTE — Therapy (Signed)
 OUTPATIENT PHYSICAL THERAPY TREATMENT    Patient Name: Carolyn Johnston MRN: [id number] DOB:05/03/1980, 43 y.o., female Today's Date: 07/03/2023   PT End of Session - 07/03/23 1801     Visit Number 6    Number of Visits 16    Date for PT Re-Evaluation 09/11/23    PT Start Time 1640    PT Stop Time 1720    PT Time Calculation (min) 40 min             Past Medical History:  Diagnosis Date   Allergy    trees, dust mites   COVID-19    x2   Migraines    Past Surgical History:  Procedure Laterality Date   adnoids suture     DILATION AND CURETTAGE OF UTERUS     TONSILLECTOMY     WISDOM TOOTH EXTRACTION     Patient Active Problem List   Diagnosis Date Noted   Hospital discharge follow-up 03/23/2023   Positive blood culture 03/23/2023   Pyelonephritis 03/23/2023   SUI (stress urinary incontinence, female) 11/10/2022   Hordeolum externum of left upper eyelid 11/10/2022   Neck pain, chronic 07/27/2022   B12 deficiency 07/27/2022   Encounter for screening for HIV 07/27/2022   Need for hepatitis C screening test 07/27/2022   Abnormal glucose 07/27/2022   High serum cortisol 06/28/2021   Brittle nails 06/26/2021   Hair loss 03/24/2021   Birth control counseling 03/24/2021   Herpes 03/24/2021   Vitamin D  insufficiency 06/06/2016   Recurrent occipital headache 04/22/2016   Bilateral occipital neuralgia 04/22/2016   Cervico-occipital neuralgia of right side 04/22/2016   Cervico-occipital neuralgia of left side 04/22/2016   Chronic pain syndrome 04/22/2016   Vestibular migraine 06/01/2013    PCP: Sueanne Emerald   REFERRING PROVIDER: Artemisa Bile DIAG: Urinary stress incontinence   Rationale for Evaluation and Treatment Rehabilitation  THERAPY DIAG:   Sacrococcygeal pain  Other abnormalities of gait and mobility  Neck pain  Other low back pain  ONSET DATE:   SUBJECTIVE:                   SUBJECTIVE STATEMENT TODAY:                      Pt reported she  has been doing her exercises                                                                                        SUBJECTIVE STATEMENT ON EVAL 04/24/23 : 1) urinary leakage:  leakage with coughing , sneezing, laughing , and yelling with cheering for dtr's sport teams. Does not wear pads.  Able to make it to the bathroom without leakage.  Nocturia 1x/ night.    2) incomplete emptying urine:  Pt feels she has to sit and there and pee again 30% of the time when she goes to pee . Pt has felt a heavier feeling her vagina with sitting and standing and with coughing, yelling   3) neck pain:   when she has a flare-up with neck pain 7/10 , normal neck pain 4/10 , pain with  looking up, and flare ups can occur with sleeping with on her belly partially . Pt reports she can not sleep on her back but her neck is uncomfortable . Radiating pain occurs can occur down either arm to the pinky.     4) low back pain: after being on her feet all day and bending over and along the ballmounds of her feet and with work activities which requires lifting 20 lbs repeatedly and twist and turning .  Constant agitating pain level of 3/10 . Non radiating pain  . Located across L/S  line. Pain started in her early 28s.                 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 Yes: see above   PRECAUTIONS: No   WEIGHT BEARING RESTRICTIONS: No   FALLS:  Has patient fallen in last 6 months? 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)    PLOF: IND   PATIENT GOALS:  Improve skills to do at home help relieve pain and discomfort and retrain her bladder    OBJECTIVE:   [facility] PT Assessment - 07/03/23 1757       Observation/Other Assessments   Observations more upright posture, no more forward head position             [facility] Adult PT Treatment/Exercise - 07/03/23 1754       Self-Care   Other Self-Care Comments  explained continence to improve with proper co-activation of pelvic floor , pelvic alignment and LKC      Neuro Re-ed    Neuro Re-ed Details  cued for anterior tilt of pelvis and more propioception of feet and alignment ot optimize upward position of urethra and lengthening of pelvic floor to help with continence , cued for multidifis standing strenghtening                  HOME EXERCISE PROGRAM: See pt instruction section    ASSESSMENT:  CLINICAL IMPRESSION:  Pt has met 3/9 goals and progressing well towards remaining goals.  LBP, neck pain have been addressed.   Pt presents with more upright posture and no more rounded shoulders, and more levelled pelvic girdle/ shoulders.Pt regained cervical ext , B rotation, sideflexion and today, showed no more deviations/ tenderness/ hypomobility along C/T spine.   Currently addressing urinary issues with incontinence and incomplete emptying.  Focused today on pelvic floor with assessment and Tx. Cued for anterior tilt of pelvis and more propioception of feet and alignment ot optimize upward position of urethra and lengthening of pelvic floor to help with continence.   Added more stability exercises for spine with for multidifis standing strenghtening     Cued for coactivation of feet with deep core system and cued for anterior tilt of pelvis to promote bladder position to help with continence. Pt demo'd correctly post training.  Plan add Kegel strengthening for pelvic floor next  session  Regional interdependent approaches will yield  greater benefits in pt's POC     Pt benefits from skilled PT.    OBJECTIVE IMPAIRMENTS decreased activity tolerance, decreased coordination, decreased endurance, decreased mobility, difficulty walking, decreased ROM, decreased strength, decreased safety awareness, hypomobility, increased muscle spasms, impaired flexibility, improper body mechanics, postural dysfunction, and pain.   ACTIVITY LIMITATIONS  self-care,  home chores, work tasks    PARTICIPATION LIMITATIONS:  community, gym activities    PERSONAL FACTORS        are also affecting patient's functional outcome.    REHAB POTENTIAL: Good   CLINICAL DECISION MAKING: Evolving/moderate complexity   EVALUATION COMPLEXITY: Moderate    PATIENT EDUCATION:    Education details: Showed pt anatomy images. Explained muscles attachments/ connection, physiology of deep core system/ spinal- thoracic-pelvis-lower kinetic chain as they relate to pt's presentation, Sx, and past Hx. Explained what and how these areas of deficits need to be restored to balance and function    See Therapeutic activity / neuromuscular re-education section  Answered pt's questions.   Person educated: Patient Education method: Explanation, Demonstration, Tactile cues, Verbal cues, and Handouts Education comprehension: verbalized understanding, returned demonstration, verbal cues required, tactile cues required, and needs further education     PLAN: PT FREQUENCY: 1x/week   PT DURATION: 10 weeks   PLANNED INTERVENTIONS: Therapeutic exercises, Therapeutic activity, Neuromuscular re-education, Balance training, Gait training, Patient/Family education, Self Care, Joint mobilization, Spinal mobilization, Moist heat, Taping, and Manual therapy, dry needling.   PLAN FOR NEXT SESSION: See clinical impression for plan     GOALS: Goals reviewed with patient? Yes  SHORT TERM GOALS: Target date: 05/22/2023    Pt will demo IND with HEP                     Baseline: Not IND            Goal status: INITIAL   LONG TERM GOALS: Target date: 09/11/2023      1.Pt will demo proper deep core coordination without chest breathing and optimal excursion of diaphragm/pelvic floor in order to promote spinal stability and pelvic floor function  Baseline: dyscoordination Goal status: MET   2.  Pt will demo proper body mechanics in against gravity tasks and ADLs  work tasks, fitness  to minimize straining pelvic floor / back    Baseline: not IND, improper form that places strain on pelvic floor  Goal status: Ongoing     3. Pt will demo increased gait speed > 1.3 m/s with reciprocal gait pattern, longer stride length  in order to ambulate safely in community and return to fitness routine  Baseline: 1.2 m/s , decreased stance on R, minimial armswings  Goal status: Ongoing    4. Pt will report decreased leakage by < 50% of the time with  coughing , sneezing, laughing , and yelling with cheering for dtr's sport teams.  Baseline:   leakage with coughing , sneezing, laughing , and yelling with cheering for dtr's sport teams.  Goal status: Ongoing    5. Pt will report able to complete urinate and not have to wait to finish across 100% of the time  Baseline: Pt feels she has to sit and there and pee again 30% of the time when she goes to pee . Goal status: Ongoing  6. Pt will demo levelled pelvic girdle and shoulder height in order to progress to deep core strengthening HEP and restore mobility at spine, pelvis, gait, posture  minimize falls, and improve balance   Baseline: R shoulder and L iliac crest higher   Goal status: MET   7. Pt will demo increased cervical ext 69 deg no pain, no flareup of neck pain across one month and compliance with avoid sleeping on belly in order to restore neck function for ADLs and work   Baseline:  pain with looking up, and flare ups can occur with sleeping with on her belly partially . Pt reports she can not sleep on her  back but her neck is uncomfortable  Radiating pain occurs can occur down either arm to the pinky with flare ups once a month  Cervical ext 50 with pain   Goal Status: Partially met ( 07/03/23: avoided belly sleeping, progresisng with increased cervical mobility)    8. Pt will report being on her feet all day, bending with no constant agitating pain level of 3/10 and no pain along ballmound of feet in order to perform work tasks   Baseline:after being on her feet all day and bending over and along the ballmounds of her feet and with work activities which requires lifting 20 lbs repeatedly and twist and turning .  Constant agitating pain level of 3/10  Goal Status:  MET    9. Pt will report , 2 pts on PDFI-7 under bladder or urine category  Baseline: 4 pts  Goal status: Ongoing                          "  Modesto Andreas, PT 07/03/2023, 6:03 PM

## 2023-07-03 NOTE — Patient Instructions (Addendum)
 Multifidis twist   Band is on doorknob: sit facing perpendicular to door , sit halfway towards front of chair, firm through 4 points of contact at buttocks and feet. Feet are placed hip with apart.   Twisting trunk without moving the hips and knees Hold band at the level of ribcage, elbows bent,shoulder blades roll back and down like squeezing a pencil under armpit   Exhale twist,.10-15 deg away from door without moving your hips/ knees, press more weight on the side of the sitting bones/ foot opp of your direction of turn as your counterweight. Continue to maintain equal weight through legs.  Keep knee unlocked.  30 reps    __   Deep core, noticed slight anterior tilt of pelvis, feet firm, knees at hip width apart to help with urethra position and upward movement of pelvic floor on exhale

## 2023-07-24 ENCOUNTER — Ambulatory Visit: Payer: Commercial Managed Care - PPO | Attending: Family Medicine | Admitting: Physical Therapy

## 2023-07-24 DIAGNOSIS — M542 Cervicalgia: Secondary | ICD-10-CM | POA: Insufficient documentation

## 2023-07-24 DIAGNOSIS — R2689 Other abnormalities of gait and mobility: Secondary | ICD-10-CM | POA: Insufficient documentation

## 2023-07-24 DIAGNOSIS — M5459 Other low back pain: Secondary | ICD-10-CM | POA: Diagnosis present

## 2023-07-24 DIAGNOSIS — M533 Sacrococcygeal disorders, not elsewhere classified: Secondary | ICD-10-CM | POA: Insufficient documentation

## 2023-07-24 NOTE — Therapy (Signed)
 OUTPATIENT PHYSICAL THERAPY TREATMENT    Patient Name: RAYETTA VEITH MRN: [id number] DOB:02-24-1980, 43 y.o., female Today's Date: 07/24/2023   PT End of Session - 07/24/23 1615     Visit Number 7    Number of Visits 16    Date for PT Re-Evaluation 09/11/23    PT Start Time 1610    PT Stop Time 1655    PT Time Calculation (min) 45 min             Past Medical History:  Diagnosis Date   Allergy    trees, dust mites   COVID-19    x2   Migraines    Past Surgical History:  Procedure Laterality Date   adnoids suture     DILATION AND CURETTAGE OF UTERUS     TONSILLECTOMY     WISDOM TOOTH EXTRACTION     Patient Active Problem List   Diagnosis Date Noted   Hospital discharge follow-up 03/23/2023   Positive blood culture 03/23/2023   Pyelonephritis 03/23/2023   SUI (stress urinary incontinence, female) 11/10/2022   Hordeolum externum of left upper eyelid 11/10/2022   Neck pain, chronic 07/27/2022   B12 deficiency 07/27/2022   Encounter for screening for HIV 07/27/2022   Need for hepatitis C screening test 07/27/2022   Abnormal glucose 07/27/2022   High serum cortisol 06/28/2021   Brittle nails 06/26/2021   Hair loss 03/24/2021   Birth control counseling 03/24/2021   Herpes 03/24/2021   Vitamin D  insufficiency 06/06/2016   Recurrent occipital headache 04/22/2016   Bilateral occipital neuralgia 04/22/2016   Cervico-occipital neuralgia of right side 04/22/2016   Cervico-occipital neuralgia of left side 04/22/2016   Chronic pain syndrome 04/22/2016   Vestibular migraine 06/01/2013    PCP: Sueanne Emerald   REFERRING PROVIDER: Artemisa Bile DIAG: Urinary stress incontinence   Rationale for Evaluation and Treatment Rehabilitation  THERAPY DIAG:   Sacrococcygeal pain  Other abnormalities of gait and mobility  Neck pain  Other low back pain  ONSET DATE:   SUBJECTIVE:                   SUBJECTIVE STATEMENT TODAY:                      Pt reported she  got sick and sneezed all day long for 3 days. Pt had only 3 leakage episodes.                                                                                SUBJECTIVE STATEMENT ON EVAL 04/24/23 : 1) urinary leakage:  leakage with coughing , sneezing, laughing , and yelling with cheering for dtr's sport teams. Does not wear pads.  Able to make it to the bathroom without leakage.  Nocturia 1x/ night.    2) incomplete emptying urine:  Pt feels she has to sit and there and pee again 30% of the time when she goes to pee . Pt has felt a heavier feeling her vagina with sitting and standing and with coughing, yelling   3) neck pain:   when she has a flare-up with neck pain 7/10 , normal neck pain 4/10 ,  pain with looking up, and flare ups can occur with sleeping with on her belly partially . Pt reports she can not sleep on her back but her neck is uncomfortable . Radiating pain occurs can occur down either arm to the pinky.     4) low back pain: after being on her feet all day and bending over and along the ballmounds of her feet and with work activities which requires lifting 20 lbs repeatedly and twist and turning .  Constant agitating pain level of 3/10 . Non radiating pain  . Located across L/S  line. Pain started in her early 61s.                 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 Yes: see above   PRECAUTIONS: No   WEIGHT BEARING RESTRICTIONS: No   FALLS:  Has patient fallen in last 6 months? 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)    PLOF: IND   PATIENT GOALS:  Improve skills to do at home help relieve pain and discomfort and retrain her bladder    OBJECTIVE:   Pelvic Floor Special Questions - 07/24/23 1715     Pelvic Floor Internal Exam pt consented verbally without contraindications    Exam Type Vaginal    Palpation tightness at anterior mm, slight lowered urerthra , able to perform 4 reps of MMT 3/5 but still withholding pelvic floor contraction training due to tightness of anterior mm and overuse of ab mm             [facility] Adult PT Treatment/Exercise - 07/24/23 1713       Neuro Re-ed    Neuro Re-ed Details  relaxation training, cued for less ab overuse, optimal circumferential and sequential movement of 3 layers of pelvic floor      Modalities   Modalities Moist Heat      Moist Heat Therapy   Number Minutes Moist Heat 5 Minutes    Moist Heat Location --   supported butterfly pose, guided relaxation and explained set up with pillows     Manual Therapy   Manual therapy comments STM/MWM at anterior mm to promote more upward movement of urethra B                  HOME EXERCISE PROGRAM: See pt instruction section    ASSESSMENT:  CLINICAL IMPRESSION:   Improvements:                       Pt reported she got sick and sneezed all day long for 3 days. Pt had only 3 leakage episodes.         Addressed tightness at anterior mm, slight lowered urethra with internal pelvic floor Tx. Pt able to perform 4 reps of MMT 3/5 but still withholding pelvic floor contraction training due to tightness of anterior mm and overuse of ab mm .  Post training, pt demo'd optimal release of pelvic floor, more upward movement of urethra and optimal circumferential and sequential movement of 3 layers of pelvic floor.  Continued to add relaxation training to POC.   Plan to reassess at next session.   Regional interdependent approaches will yield greater  benefits in pt's POC     Pt benefits from skilled PT.    OBJECTIVE IMPAIRMENTS decreased activity tolerance, decreased coordination, decreased endurance, decreased mobility, difficulty walking, decreased ROM, decreased strength, decreased safety awareness, hypomobility, increased muscle spasms, impaired flexibility, improper body mechanics, postural dysfunction, and pain.   ACTIVITY LIMITATIONS  self-care,  home chores, work tasks    PARTICIPATION LIMITATIONS:  community, gym activities    PERSONAL FACTORS        are also affecting patient's functional outcome.    REHAB POTENTIAL: Good   CLINICAL DECISION MAKING: Evolving/moderate complexity   EVALUATION COMPLEXITY: Moderate    PATIENT EDUCATION:    Education details: Showed pt anatomy images. Explained muscles attachments/ connection, physiology of deep core system/ spinal- thoracic-pelvis-lower kinetic chain as they relate to pt's presentation, Sx, and past Hx. Explained what and how these areas of deficits need to be restored to balance and function    See Therapeutic activity / neuromuscular re-education section  Answered pt's questions.   Person educated: Patient Education method: Explanation, Demonstration, Tactile cues, Verbal cues, and Handouts Education comprehension: verbalized understanding, returned demonstration, verbal cues required, tactile cues required, and needs further education     PLAN: PT FREQUENCY: 1x/week   PT DURATION: 10 weeks   PLANNED INTERVENTIONS: Therapeutic exercises, Therapeutic activity, Neuromuscular re-education, Balance training, Gait training, Patient/Family education, Self Care, Joint mobilization, Spinal mobilization, Moist heat, Taping, and Manual therapy, dry needling.   PLAN FOR NEXT SESSION: See clinical impression for plan     GOALS: Goals reviewed with patient? Yes  SHORT TERM GOALS: Target date: 05/22/2023    Pt will demo IND with HEP                    Baseline: Not  IND            Goal status: INITIAL   LONG TERM GOALS: Target date: 09/11/2023      1.Pt will demo proper deep core coordination without chest breathing and optimal excursion of diaphragm/pelvic floor in order to promote spinal stability and pelvic floor function  Baseline: dyscoordination Goal status: MET   2.  Pt will demo proper body mechanics in against gravity tasks and ADLs  work tasks, fitness  to minimize straining pelvic floor / back    Baseline: not IND, improper form that places strain on pelvic floor  Goal status: Ongoing     3. Pt will demo increased gait speed > 1.3 m/s with reciprocal gait pattern, longer stride length  in order to ambulate safely in community and return to fitness routine  Baseline: 1.2 m/s , decreased stance on R, minimial armswings  Goal status: Ongoing    4. Pt will report decreased leakage by < 50% of the time with  coughing , sneezing, laughing , and yelling with cheering for dtr's sport teams.  Baseline:   leakage with coughing , sneezing, laughing , and yelling with cheering for dtr's sport teams.  Goal status: Ongoing    5. Pt will report able to complete urinate and not have to wait to finish across 100% of the time  Baseline: Pt feels she has to sit and there and pee  again 30% of the time when she goes to pee . Goal status: Ongoing  6. Pt will demo levelled pelvic girdle and shoulder height in order to progress to deep core strengthening HEP and restore mobility at spine, pelvis, gait, posture minimize falls, and improve balance   Baseline: R shoulder and L iliac crest higher   Goal status: MET   7. Pt will demo increased cervical ext 69 deg no pain, no flareup of neck pain across one month and compliance with avoid sleeping on belly in order to restore neck function for ADLs and work   Baseline:  pain with looking up, and flare ups can occur with sleeping with on her belly partially . Pt reports she can not sleep on her back but her  neck is uncomfortable  Radiating pain occurs can occur down either arm to the pinky with flare ups once a month  Cervical ext 50 with pain   Goal Status: Partially met ( 07/03/23: avoided belly sleeping, progresisng with increased cervical mobility)    8. Pt will report being on her feet all day, bending with no constant agitating pain level of 3/10 and no pain along ballmound of feet in order to perform work tasks   Baseline:after being on her feet all day and bending over and along the ballmounds of her feet and with work activities which requires lifting 20 lbs repeatedly and twist and turning .  Constant agitating pain level of 3/10  Goal Status:  MET    9. Pt will report , 2 pts on PDFI-7 under bladder or urine category  Baseline: 4 pts  Goal status: Ongoing                          "  Modesto Andreas, PT 07/24/2023, 4:16 PM

## 2023-07-31 ENCOUNTER — Ambulatory Visit: Payer: Commercial Managed Care - PPO | Attending: Family Medicine | Admitting: Physical Therapy

## 2023-07-31 DIAGNOSIS — M533 Sacrococcygeal disorders, not elsewhere classified: Secondary | ICD-10-CM | POA: Diagnosis present

## 2023-07-31 DIAGNOSIS — M5459 Other low back pain: Secondary | ICD-10-CM | POA: Insufficient documentation

## 2023-07-31 DIAGNOSIS — R2689 Other abnormalities of gait and mobility: Secondary | ICD-10-CM | POA: Diagnosis present

## 2023-07-31 NOTE — Therapy (Signed)
 OUTPATIENT PHYSICAL THERAPY TREATMENT    Patient Name: Carolyn Johnston MRN: [id number] DOB:1980/06/09, 43 y.o., female Today's Date: 07/31/2023   PT End of Session - 07/31/23 1603     Visit Number 8    Number of Visits 16    Date for PT Re-Evaluation 09/11/23    PT Start Time 1600    PT Stop Time 1645    PT Time Calculation (min) 45 min          Past Medical History:  Diagnosis Date   Allergy    trees, dust mites   COVID-19    x2   Migraines    Past Surgical History:  Procedure Laterality Date   adnoids suture     DILATION AND CURETTAGE OF UTERUS     TONSILLECTOMY     WISDOM TOOTH EXTRACTION     Patient Active Problem List   Diagnosis Date Noted   Hospital discharge follow-up 03/23/2023   Positive blood culture 03/23/2023   Pyelonephritis 03/23/2023   SUI (stress urinary incontinence, female) 11/10/2022   Hordeolum externum of left upper eyelid 11/10/2022   Neck pain, chronic 07/27/2022   B12 deficiency 07/27/2022   Encounter for screening for HIV 07/27/2022   Need for hepatitis C screening test 07/27/2022   Abnormal glucose 07/27/2022   High serum cortisol 06/28/2021   Brittle nails 06/26/2021   Hair loss 03/24/2021   Birth control counseling 03/24/2021   Herpes 03/24/2021   Vitamin D  insufficiency 06/06/2016   Recurrent occipital headache 04/22/2016   Bilateral occipital neuralgia 04/22/2016   Cervico-occipital neuralgia of right side 04/22/2016   Cervico-occipital neuralgia of left side 04/22/2016   Chronic pain syndrome 04/22/2016   Vestibular migraine 06/01/2013    PCP: Sueanne Emerald   REFERRING PROVIDER: Artemisa Bile DIAG: Urinary stress incontinence   Rationale for Evaluation and Treatment Rehabilitation  THERAPY DIAG:   Sacrococcygeal pain  Other abnormalities of gait and mobility  Neck pain  Other low back pain  ONSET DATE:   SUBJECTIVE:                   SUBJECTIVE STATEMENT TODAY:                      Pt reports she had  one really bad leakage episode with sneezing where she had to change clothing.                                                             SUBJECTIVE STATEMENT ON EVAL 04/24/23 : 1) urinary leakage:  leakage with coughing , sneezing, laughing , and yelling with cheering for dtr's sport teams. Does not wear pads.  Able to make it to the bathroom without leakage.  Nocturia 1x/ night.    2) incomplete emptying urine:  Pt feels she has to sit and there and pee again 30% of the time when she goes to pee . Pt has felt a heavier feeling her vagina with sitting and standing and with coughing, yelling   3) neck pain:   when she has a flare-up with neck pain 7/10 , normal neck pain 4/10 , pain with looking up, and flare ups can occur with sleeping with on her belly partially . Pt reports she can not sleep  on her back but her neck is uncomfortable . Radiating pain occurs can occur down either arm to the pinky.     4) low back pain: after being on her feet all day and bending over and along the ballmounds of her feet and with work activities which requires lifting 20 lbs repeatedly and twist and turning .  Constant agitating pain level of 3/10 . Non radiating pain  . Located across L/S  line. Pain started in her early 90s.                 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 Yes: see above   PRECAUTIONS: No   WEIGHT BEARING RESTRICTIONS: No   FALLS:  Has patient fallen in last 6 months? 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)    PLOF: IND   PATIENT GOALS:  Improve skills to do at home help relieve pain and discomfort and retrain her bladder    OBJECTIVE:     Pelvic Floor Special Questions - 07/31/23 1643     Pelvic Floor Internal Exam pt consented verbally without contraindications    Exam Type Vaginal    Palpation tightness and flinching tenderness at 11 0/clock ,  lowered urethra , no more ab overuse   Cued for toe abduction for optimal relaxation pelvic floor         [facility] Adult PT Treatment/Exercise - 07/31/23 1641       Self-Care   Other Self-Care Comments  consolidated HEP      Neuro Re-ed    Neuro Re-ed Details  cued for pelvic floor stretch      Modalities   Modalities Moist Heat      Moist Heat Therapy   Number Minutes Moist Heat 5 Minutes    Moist Heat Location --   supported butterfly pose, guided relaxation and explained set up with pillows     Manual Therapy   Manual therapy comments STM/MWM at anterior mm to promote more upward movement of urethra R, release tightness along the R 11 o'clock                  HOME EXERCISE PROGRAM: See pt instruction section    ASSESSMENT:  CLINICAL IMPRESSION:        Continued to addressed tightness at anterior mm on R today and slight lowered urethra with internal pelvic floor Tx. Pt demo'd improved lengthening of pelvic floor. Pt required a tactile cue with small rolled towel under L/S area to maintain anterior tilt of pelvis and cues for toe abduction . Consolidated HEP for pt to maintain over one month.    Still withholding kegl training due to overactivity of pelvic floor. Plan to reassess at next session.   Regional interdependent approaches will yield greater benefits in pt's POC     Pt benefits from skilled PT.    OBJECTIVE IMPAIRMENTS decreased activity tolerance, decreased coordination,  decreased endurance, decreased mobility, difficulty walking, decreased ROM, decreased  strength, decreased safety awareness, hypomobility, increased muscle spasms, impaired flexibility, improper body mechanics, postural dysfunction, and pain.   ACTIVITY LIMITATIONS  self-care,  home chores, work tasks    PARTICIPATION LIMITATIONS:  community, gym activities    PERSONAL FACTORS        are also affecting patient's functional outcome.    REHAB POTENTIAL: Good   CLINICAL DECISION MAKING: Evolving/moderate complexity   EVALUATION COMPLEXITY: Moderate    PATIENT EDUCATION:    Education details: Showed pt anatomy images. Explained muscles attachments/ connection, physiology of deep core system/ spinal- thoracic-pelvis-lower kinetic chain as they relate to pt's presentation, Sx, and past Hx. Explained what and how these areas of deficits need to be restored to balance and function    See Therapeutic activity / neuromuscular re-education section  Answered pt's questions.   Person educated: Patient Education method: Explanation, Demonstration, Tactile cues, Verbal cues, and Handouts Education comprehension: verbalized understanding, returned demonstration, verbal cues required, tactile cues required, and needs further education     PLAN: PT FREQUENCY: 1x/week   PT DURATION: 10 weeks   PLANNED INTERVENTIONS: Therapeutic exercises, Therapeutic activity, Neuromuscular re-education, Balance training, Gait training, Patient/Family education, Self Care, Joint mobilization, Spinal mobilization, Moist heat, Taping, and Manual therapy, dry needling.   PLAN FOR NEXT SESSION: See clinical impression for plan     GOALS: Goals reviewed with patient? Yes  SHORT TERM GOALS: Target date: 05/22/2023    Pt will demo IND with HEP                    Baseline: Not IND            Goal status: INITIAL   LONG TERM GOALS: Target date: 09/11/2023      1.Pt will demo proper deep core coordination without chest breathing and optimal excursion of diaphragm/pelvic floor in order to  promote spinal stability and pelvic floor function  Baseline: dyscoordination Goal status: MET   2.  Pt will demo proper body mechanics in against gravity tasks and ADLs  work tasks, fitness  to minimize straining pelvic floor / back    Baseline: not IND, improper form that places strain on pelvic floor  Goal status: Ongoing     3. Pt will demo increased gait speed > 1.3 m/s with reciprocal gait pattern, longer stride length  in order to ambulate safely in community and return to fitness routine  Baseline: 1.2 m/s , decreased stance on R, minimial armswings  Goal status: Ongoing    4. Pt will report decreased leakage by < 50% of the time with  coughing , sneezing, laughing , and yelling with cheering for dtr's sport teams.  Baseline:   leakage with coughing , sneezing, laughing , and yelling with cheering for dtr's sport teams.  Goal status: Ongoing    5. Pt will report able to complete urinate and not have to wait to finish across 100% of the time  Baseline: Pt feels she has to sit and there and pee again 30% of the time when she goes to pee . Goal status: Ongoing  6. Pt will demo levelled pelvic girdle and shoulder height in order to progress to deep core strengthening HEP and restore mobility at spine, pelvis, gait, posture minimize falls, and improve balance   Baseline: R shoulder and L iliac crest higher   Goal status: MET   7. Pt will demo increased cervical ext 69 deg  no pain, no flareup of neck pain across one month and compliance with avoid sleeping on belly in order to restore neck function for ADLs and work   Baseline:  pain with looking up, and flare ups can occur with sleeping with on her belly partially . Pt reports she can not sleep on her back but her neck is uncomfortable  Radiating pain occurs can occur down either arm to the pinky with flare ups once a month  Cervical ext 50 with pain   Goal Status: Partially met ( 07/03/23: avoided belly sleeping, progresisng  with increased cervical mobility)    8. Pt will report being on her feet all day, bending with no constant agitating pain level of 3/10 and no pain along ballmound of feet in order to perform work tasks   Baseline:after being on her feet all day and bending over and along the ballmounds of her feet and with work activities which requires lifting 20 lbs repeatedly and twist and turning .  Constant agitating pain level of 3/10  Goal Status:  MET    9. Pt will report , 2 pts on PDFI-7 under bladder or urine category  Baseline: 4 pts  Goal status: Ongoing                            Modesto Andreas, PT 07/31/2023, 4:03 PM

## 2023-07-31 NOTE — Patient Instructions (Addendum)
 STRETCHES for spine  **  Motion is lotion  - good morning and evening     On your side - winging and brushing   Pillow between knees and behind back     On your back with towel roll under the neck  - neck 6 directions - angel wings - ( dragging arms)  - ZigZag stretch scoot hips to one side and rock knees to opposite, arms by side ,palms up      On your other side - winging and brushing   Pillow between knees and behind back       STRETCHES for pelvic floor  **   Mermaid stretch  Rocking while seated on the floor with heels to one side of the hip Heels to one side of the hip  Rock forward towards the knee that is bent , rock beck towards the opposite sitting bones                Restorative yoga:  (relaxation)   Butterfly  **  Legs up the wall        Strengthening:  Deep core leve 1-2 ( 2 x day) with a small rolled towel under low back for anterior tilt of pelvis **  Dolphin by the wall for shoulders and back  A to W band for the neck and back

## 2023-11-24 ENCOUNTER — Telehealth: Payer: Self-pay

## 2023-11-24 ENCOUNTER — Other Ambulatory Visit (HOSPITAL_COMMUNITY)
Admission: RE | Admit: 2023-11-24 | Discharge: 2023-11-24 | Disposition: A | Discharge status: Home / Self Care | Source: Ambulatory Visit | Attending: Obstetrics and Gynecology | Admitting: Obstetrics and Gynecology

## 2023-11-24 ENCOUNTER — Other Ambulatory Visit

## 2023-11-24 ENCOUNTER — Ambulatory Visit

## 2023-11-24 ENCOUNTER — Encounter: Payer: Self-pay | Admitting: Obstetrics and Gynecology

## 2023-11-24 VITALS — BP 133/80 | HR 90 | Ht 62.0 in | Wt 118.5 lb

## 2023-11-24 DIAGNOSIS — Z113 Encounter for screening for infections with a predominantly sexual mode of transmission: Secondary | ICD-10-CM

## 2023-11-24 NOTE — Telephone Encounter (Signed)
 Copied from CRM [id number]. Topic: Appointments - Transfer of Care >> Nov 24, 2023  8:06 AM Kendralyn S wrote: Pt is requesting to transfer FROM: n/a Pt is requesting to transfer TO: charanpreet kaur Reason for requested transfer: pt request It is the responsibility of the team the patient would like to transfer to (Dr. vincente) to reach out to the patient if for any reason this transfer is not acceptable. >> Nov 24, 2023  8:28 AM Kendralyn S wrote: During scheduling pt decided to go with dr abbey instead of kaur

## 2023-11-24 NOTE — Progress Notes (Signed)
    NURSE VISIT NOTE  Subjective:    Patient ID: Carolyn Johnston, female    DOB: 07/08/1980, 43 y.o.   MRN: [id number]  HPI  Patient is a 43 y.o. G20P0010 female who presents for STD testing only no symptoms. Denies abnormal vaginal bleeding or significant pelvic pain or fever. denies dysuria. Patient has history of known exposure to STD.   Objective:    BP 133/80   Pulse 90   Ht 5' 2 (1.575 m)   Wt 118 lb 8 oz (53.8 kg)   BMI 21.67 kg/m    @THIS  VISIT ONLY@  Assessment:   1. Screening for STD (sexually transmitted disease)     trichomonas  Plan:   GC and chlamydia DNA  probe sent to lab   Mathis LITTIE Getting, CMA

## 2023-11-24 NOTE — Addendum Note (Signed)
 Addended by: WATT HILA B on: 11/24/2023 09:30 AM   Modules accepted: Orders

## 2023-11-24 NOTE — Addendum Note (Signed)
 Addended by: WATT HILA B on: 11/24/2023 10:12 AM   Modules accepted: Orders

## 2023-11-25 LAB — RPR: RPR Ser Ql: NONREACTIVE

## 2023-11-25 LAB — HEPATITIS C ANTIBODY: Hep C Virus Ab: NONREACTIVE

## 2023-11-25 LAB — HIV ANTIBODY (ROUTINE TESTING W REFLEX): HIV Screen 4th Generation wRfx: NONREACTIVE

## 2023-11-26 ENCOUNTER — Ambulatory Visit: Payer: Self-pay | Admitting: Obstetrics

## 2023-11-26 ENCOUNTER — Other Ambulatory Visit: Payer: Self-pay | Admitting: Obstetrics

## 2023-11-26 LAB — CERVICOVAGINAL ANCILLARY ONLY
Bacterial Vaginitis (gardnerella): NEGATIVE
Candida Glabrata: NEGATIVE
Candida Vaginitis: POSITIVE — AB
Chlamydia: NEGATIVE
Comment: NEGATIVE
Comment: NEGATIVE
Comment: NEGATIVE
Comment: NEGATIVE
Comment: NEGATIVE
Comment: NORMAL
Neisseria Gonorrhea: NEGATIVE
Trichomonas: NEGATIVE

## 2023-11-26 MED ORDER — FLUCONAZOLE 150 MG PO TABS: 150.0000 mg | ORAL_TABLET | Freq: Once | ORAL | 1 refills | Status: AC

## 2023-11-28 NOTE — Telephone Encounter (Signed)
 Noted

## 2023-12-11 IMAGING — MG MM DIGITAL SCREENING BILAT W/ TOMO AND CAD
8 series · 9 of 24 positions shown · non-contrast
Comparison: Previous exam(s).

CLINICAL DATA: Screening.

EXAM:
DIGITAL SCREENING BILATERAL MAMMOGRAM WITH TOMOSYNTHESIS AND CAD
TECHNIQUE: Bilateral screening digital craniocaudal and mediolateral oblique
mammograms were obtained. Bilateral screening digital breast
tomosynthesis was performed. The images were evaluated with
computer-aided detection.

[R CC synth-2D]
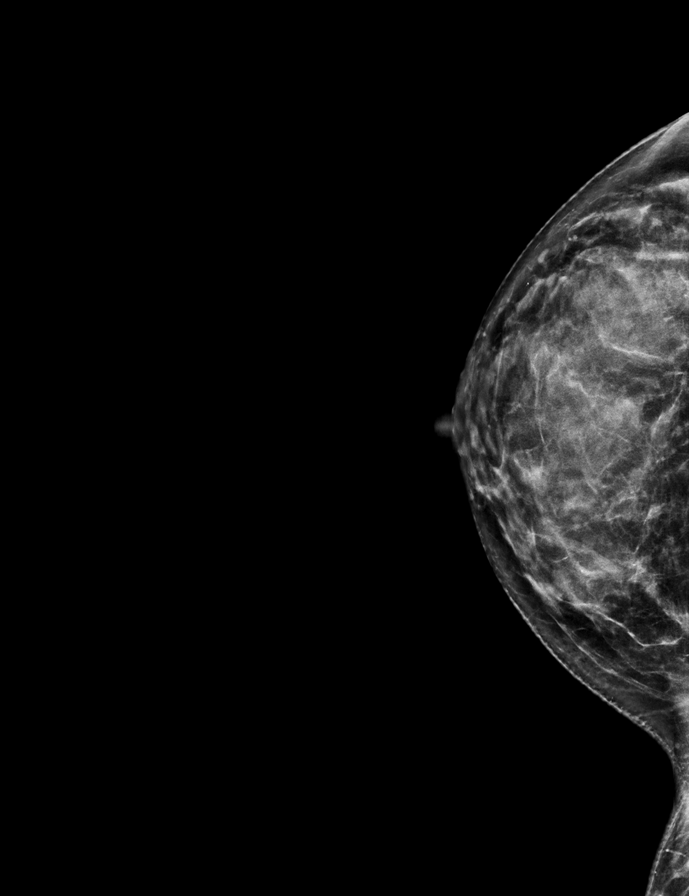

[R MLO synth-2D]
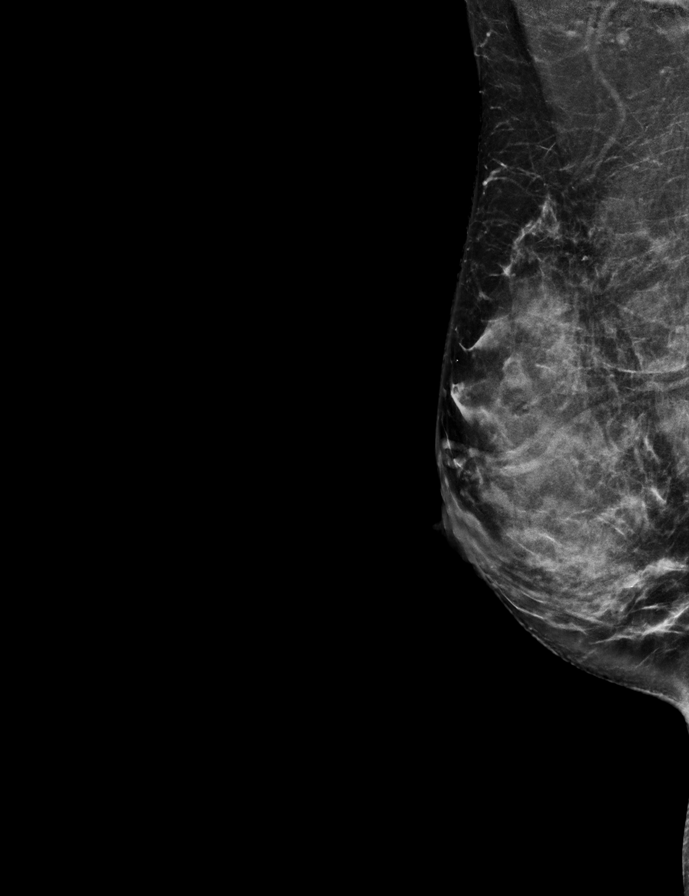

[L MLO synth-2D]
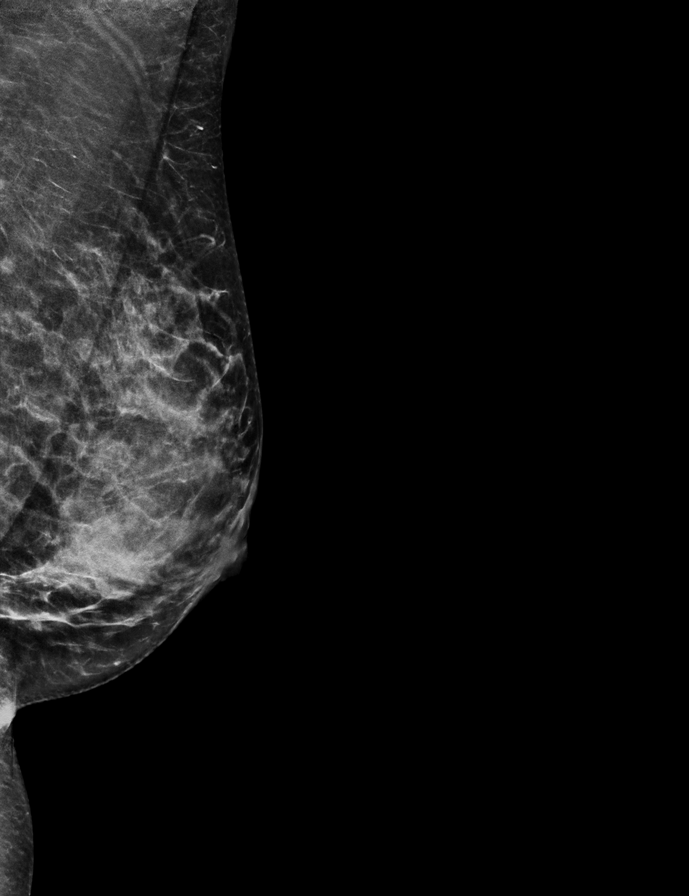

[L CC synth-2D]
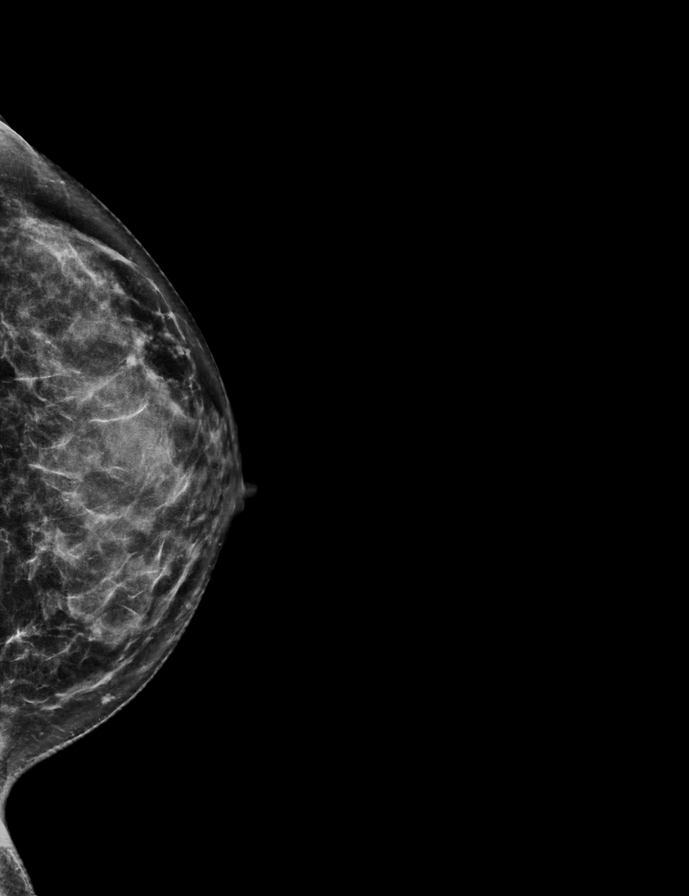

[L CC tomo · 2 of 61 frames shown]
[frame 20/61]
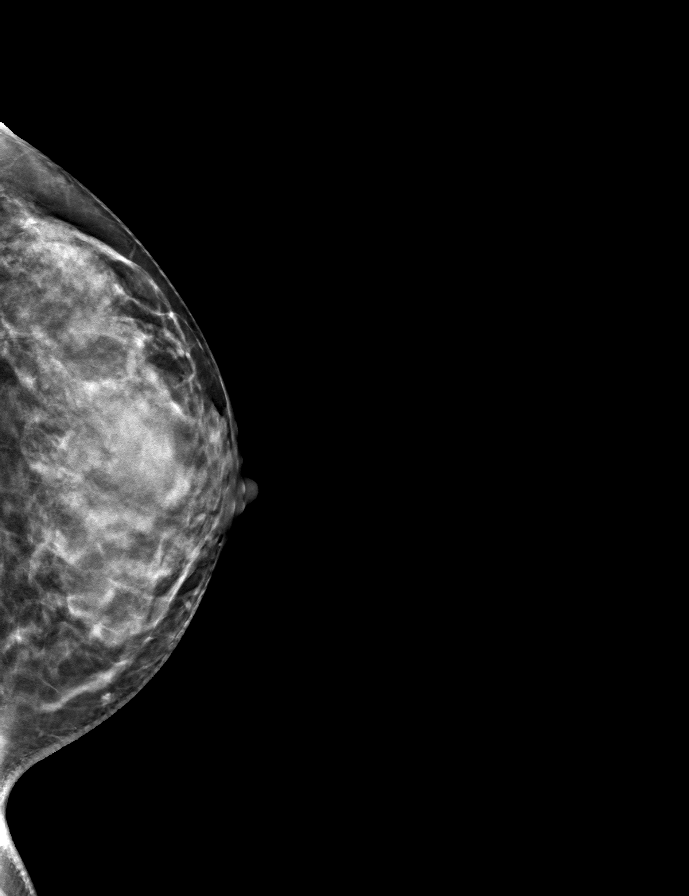
[frame 31/61]
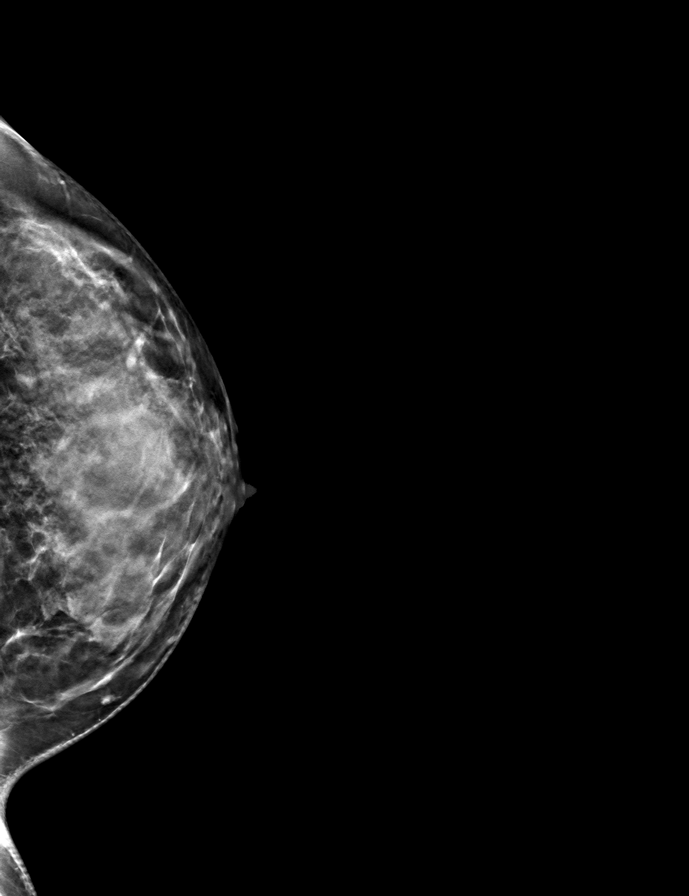

[R CC tomo · tomo slice 30/59.0]
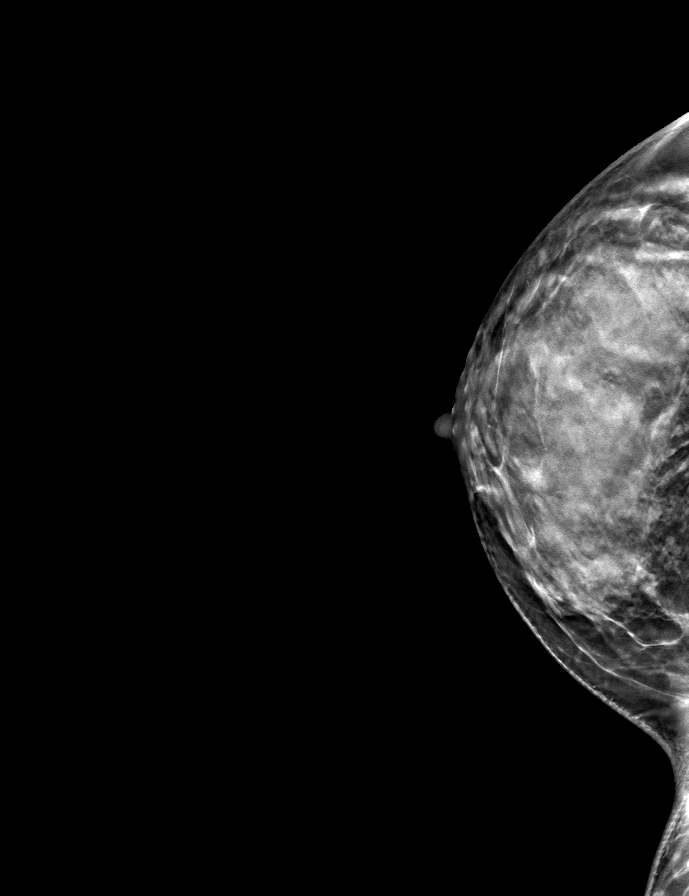

[R MLO tomo · tomo slice 35/68.0]
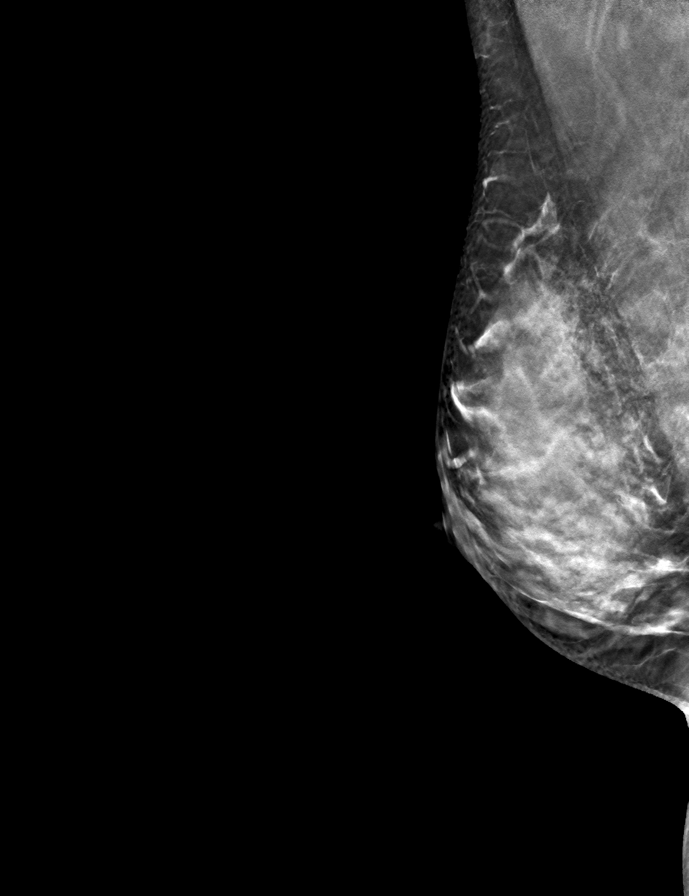

[L MLO tomo · tomo slice 29/57.0]
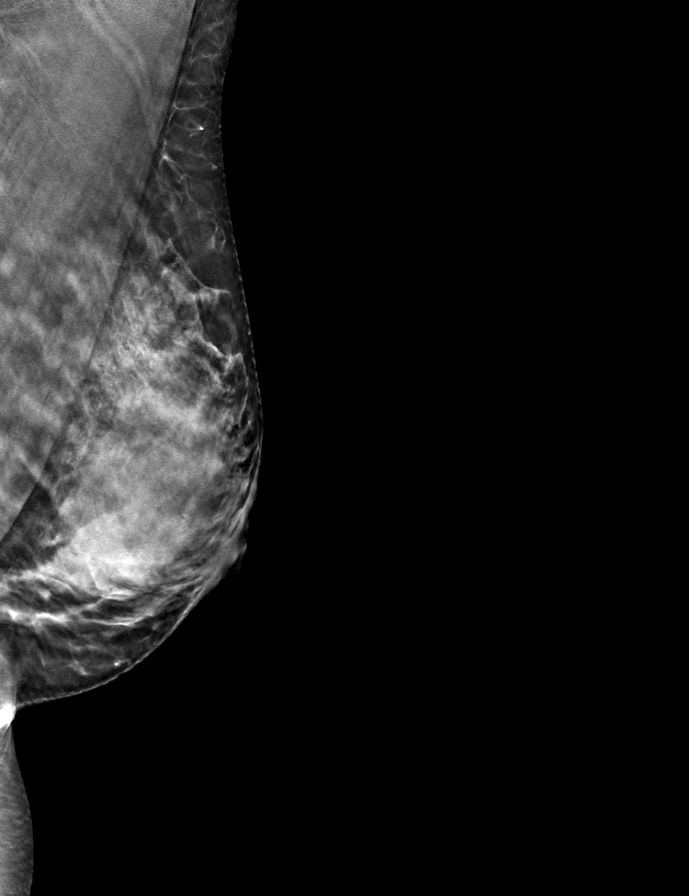

[9 of 24 positions shown; findings below may reference images not displayed]

ACR Breast Density Category d: The breast tissue is extremely dense,
which lowers the sensitivity of mammography
FINDINGS: There are no findings suspicious for malignancy.
IMPRESSION: No mammographic evidence of malignancy. A result letter of this
screening mammogram will be mailed directly to the patient.

RECOMMENDATION:
Screening mammogram in one year. (Code:[id number])

BI-RADS CATEGORY  1: Negative.

## 2024-01-08 NOTE — Telephone Encounter (Signed)
 open in error

## 2024-02-03 ENCOUNTER — Encounter: Payer: Self-pay | Admitting: Internal Medicine

## 2024-02-03 ENCOUNTER — Other Ambulatory Visit: Payer: Self-pay | Admitting: Internal Medicine

## 2024-02-03 ENCOUNTER — Other Ambulatory Visit: Payer: Self-pay

## 2024-02-03 ENCOUNTER — Ambulatory Visit (INDEPENDENT_AMBULATORY_CARE_PROVIDER_SITE_OTHER): Admitting: Internal Medicine

## 2024-02-03 VITALS — BP 120/72 | HR 84 | Temp 98.3°F | Resp 18 | Ht 62.0 in | Wt 121.3 lb

## 2024-02-03 DIAGNOSIS — B009 Herpesviral infection, unspecified: Secondary | ICD-10-CM

## 2024-02-03 DIAGNOSIS — G43009 Migraine without aura, not intractable, without status migrainosus: Secondary | ICD-10-CM

## 2024-02-03 DIAGNOSIS — N952 Postmenopausal atrophic vaginitis: Secondary | ICD-10-CM

## 2024-02-03 DIAGNOSIS — L659 Nonscarring hair loss, unspecified: Secondary | ICD-10-CM

## 2024-02-03 MED ORDER — KETOCONAZOLE 2 % EX SHAM: MEDICATED_SHAMPOO | CUTANEOUS | 2 refills | Status: AC

## 2024-02-03 NOTE — Progress Notes (Signed)
 New Patient Office Visit  Subjective    Patient ID: Carolyn Johnston, female    DOB: October 04, 1980  Age: 43 y.o. MRN: [id number]  CC:  Chief Complaint  Patient presents with   Establish Care   Female Issues    HPI Carolyn Johnston presents to establish care.  Discussed the use of AI scribe software for clinical note transcription with the patient, who gave verbal consent to proceed.  History of Present Illness Carolyn Johnston is a 43 year old female who presents for establishing care and medication review.  She has chronic migraines managed with prior Botox injections and Vyepti infusions, which reduced frequency from 17 to about 7-10 per month. She now uses over the counter medications and follows with two neurologists. The neurologist who provided Botox is leaving, and she may need to return to her original neurologist for ongoing Botox treatment.  She has recurrent HSV controlled on Valtrex  1000 mg daily with no flares since starting this dose. She had recurrence when she tried 500 mg earlier this year and returned to 1000 mg daily. Her gynecologist previously prescribed Valtrex , but that gynecologist is leaving, and she will need a new prescriber.  She uses continuous Sprintec to avoid menses due to severe cramps after switching from an IUD, which did not improve migraines. Recently she has noticed vulvar changes with the labia feeling thinner and like flatbread, and she is concerned about hormonal changes and possible perimenopause. She reports hair loss over the past few years and is on spironolactone  and oral minoxidil  with some new hair growth, along with home LED therapy.  She was hospitalized for four days in January for her first kidney stone.  She is on spironolactone  prescribed by dermatology for acne and hair regrowth, along with oral minoxidil  and LED therapy. She notes chronic hair shedding but ongoing regrowth.  Recent labs showed mildly low B12, and she is receiving  B12 injections.   Migraines: -Follow with Neurology -Currently on Vyepti and Botox -Still having 7-10 migraines a month -Severity has improved   Recurrent HSV -Currently on Valtrex  1000 mg daily, no flares since being on 1000 mg  Contraception: -Currently on continuous Sprintec for years, still having intense phantom cramps without bleeding -Noticed recent vulvar atrophy   Acne: -Currently on Spironolactone  and topical medications from Dermatology   Health Maintenance: -Blood work due -Mammogram 3/25 Birads-1 -Declines flu vaccine  Outpatient Encounter Medications as of 02/03/2024  Medication Sig   BOTOX 200 units injection    clindamycin-benzoyl peroxide (BENZACLIN) gel Apply topically every morning.   cyanocobalamin  (VITAMIN B12) 1000 MCG/ML injection Inject 1,000 mcg into the muscle every 30 (thirty) days.   ketoconazole  (NIZORAL ) 2 % shampoo Apply topically.   minoxidil  (LONITEN ) 2.5 MG tablet Take 1 tablet (2.5 mg total) by mouth daily.   norgestimate -ethinyl estradiol  (SPRINTEC 28) 0.25-35 MG-MCG tablet Take 1 tablet by mouth daily. CONTINUOUS DOSING   spironolactone  (ALDACTONE ) 50 MG tablet Take 50 mg by mouth 2 (two) times daily.   Syringe/Needle, Disp, (SYRINGE 3CC/25GX1) 25G X 1 3 ML MISC To be used with Vit B12 1000 mcg IM once a week for 4 weeks then once a month for 4 months   tretinoin (RETIN-A) 0.1 % cream SMARTSIG:Sparingly Topical   valACYclovir  (VALTREX ) 1000 MG tablet Take 1 tablet (1,000 mg total) by mouth daily.   Condoms - Female (FC2 FEMALE CONDOM) MISC Place 1 Units vaginally daily as needed. (Patient not taking: Reported on 02/03/2024)  No facility-administered encounter medications on file as of 02/03/2024.    Past Medical History:  Diagnosis Date   Allergy    trees, dust mites   COVID-19    x2   Migraines    Vitamin D  deficiency     Past Surgical History:  Procedure Laterality Date   adnoids suture     DILATION AND CURETTAGE OF  UTERUS     TONSILLECTOMY     WISDOM TOOTH EXTRACTION      Family History  Problem Relation Age of Onset   Arthritis Mother        RA since 76s   Headache Mother    Fibroids Mother    Rheum arthritis Mother    Drug abuse Father    Diabetes Maternal Grandmother    Parkinson's disease Maternal Grandfather     Social History   Socioeconomic History   Marital status: Single    Spouse name: Not on file   Number of children: Not on file   Years of education: Not on file   Highest education level: Associate degree: occupational, scientist, product/process development, or vocational program  Occupational History   Not on file  Tobacco Use   Smoking status: Former    Current packs/day: 0.00    Types: Cigarettes    Quit date: 06/29/2008    Years since quitting: 15.6   Smokeless tobacco: Never  Vaping Use   Vaping status: Never Used  Substance and Sexual Activity   Alcohol use: No   Drug use: No   Sexual activity: Not Currently    Birth control/protection: Pill, Condom  Other Topics Concern   Not on file  Social History Narrative   Works  [employer]   2 daughter (11 and 31 as of 11/21/21)   Social Drivers of Health   Tobacco Use: Medium Risk (02/03/2024)   Patient History    Smoking Tobacco Use: Former    Smokeless Tobacco Use: Never    Passive Exposure: Not on Actuary Strain: Low Risk (10/28/2022)   Overall Financial Resource Strain (CARDIA)    Difficulty of Paying Living Expenses: Not very hard  Food Insecurity: No Food Insecurity (10/28/2022)   Hunger Vital Sign    Worried About Running Out of Food in the Last Year: Never true    Ran Out of Food in the Last Year: Never true  Transportation Needs: No Transportation Needs (10/28/2022)   PRAPARE - Administrator, Civil Service (Medical): No    Lack of Transportation (Non-Medical): No  Physical Activity: Insufficiently Active (10/28/2022)   Exercise Vital Sign    Days of Exercise per Week: 5 days    Minutes of Exercise per  Session: 20 min  Stress: No Stress Concern Present (10/28/2022)   [employer] of Occupational Health - Occupational Stress Questionnaire    Feeling of Stress : Only a little  Social Connections: Moderately Integrated (10/28/2022)   Social Connection and Isolation Panel    Frequency of Communication with Friends and Family: More than three times a week    Frequency of Social Gatherings with Friends and Family: Twice a week    Attends Religious Services: 1 to 4 times per year    Active Member of [employer] or Organizations: Yes    Attends Banker Meetings: 1 to 4 times per year    Marital Status: Divorced  Intimate Partner Violence: Not on file  Depression (PHQ2-9): Low Risk (02/03/2024)   Depression (PHQ2-9)    PHQ-2  Score: 0  Alcohol Screen: Not on file  Housing: Low Risk (10/28/2022)   Housing    Last Housing Risk Score: 0  Utilities: Not on file  Health Literacy: Not on file    Review of Systems  All other systems reviewed and are negative.       Objective    BP 120/72 (Cuff Size: Normal)   Pulse 84   Temp 98.3 F (36.8 C) (Oral)   Resp 18   Ht 5' 2 (1.575 m)   Wt 121 lb 4.8 oz (55 kg)   SpO2 98%   BMI 22.19 kg/m   Physical Exam Constitutional:      Appearance: Normal appearance.  HENT:     Head: Normocephalic and atraumatic.  Eyes:     Conjunctiva/sclera: Conjunctivae normal.  Cardiovascular:     Rate and Rhythm: Normal rate and regular rhythm.  Pulmonary:     Effort: Pulmonary effort is normal.     Breath sounds: Normal breath sounds.  Skin:    General: Skin is warm and dry.  Neurological:     General: No focal deficit present.     Mental Status: She is alert. Mental status is at baseline.  Psychiatric:        Mood and Affect: Mood normal.        Behavior: Behavior normal.         Assessment & Plan:   Assessment & Plan Migraine Chronic migraines improved in frequency and severity with current Botox and Vyepti regimen. Migraines  better controlled, now responsive to OTC medications. - Continue Botox and Vyepti infusions. - Consult neurologist for further management options.  Recurrent genital herpes simplex infection Recurrent HSV managed with Valtrex  1000 mg daily. No recent flares. Dose reduction to 500 mg was unsuccessful. - Continue Valtrex  1000 mg daily. - Refill Valtrex  prescription as needed.  Nonscarring hair loss Chronic hair loss with regrowth. Possible hormonal influence. Managed by dermatologist with oral minoxidil  and LED therapy. - Order FSH, LH, thyroid , and testosterone labs. - Continue oral minoxidil  and LED therapy.  Vulvar atrophy Recent vulvar atrophy with labial tissue thinning. Possible hormonal changes considered. Estrogen cream discussed. - Estrogen cream not covered by insurance, recommend follow up with Gynecology.   General Health Maintenance Routine health maintenance discussed. Mammogram and tetanus vaccine up to date. Colon cancer screening not yet due. HPV vaccination completed. - Discuss colon cancer screening options at age 75. - Ensure routine health maintenance is up to date.  - Eptinezumab-jjmr (VYEPTI) 100 MG/ML injection; Inject into the vein. - TSH - FSH/LH - Testosterone,Free and Total - ketoconazole  (NIZORAL ) 2 % shampoo; Apply topically 2 (two) times a week.  Dispense: 120 mL; Refill: 2   Return in about 3 months (around 05/03/2024).   Sharyle Fischer, DO

## 2024-02-04 ENCOUNTER — Ambulatory Visit: Payer: Self-pay | Admitting: Internal Medicine

## 2024-02-04 LAB — FSH/LH
FSH: <0.7 m[IU]/mL — ABNORMAL LOW
LH: <0.2 m[IU]/mL — ABNORMAL LOW

## 2024-02-04 LAB — TSH: TSH: 0.66 m[IU]/L

## 2024-02-14 LAB — TESTOSTERONE, FREE & TOTAL
Free Testosterone: 0.3 pg/mL (ref 0.1–6.4)
Testosterone, Total, LC-MS-MS: 6 ng/dL (ref 2–45)

## 2024-02-16 ENCOUNTER — Ambulatory Visit: Payer: Self-pay | Admitting: Internal Medicine

## 2024-02-16 ENCOUNTER — Encounter: Payer: Self-pay | Admitting: Internal Medicine

## 2024-02-17 ENCOUNTER — Ambulatory Visit (INDEPENDENT_AMBULATORY_CARE_PROVIDER_SITE_OTHER): Admitting: Family Medicine

## 2024-02-17 ENCOUNTER — Encounter: Payer: Self-pay | Admitting: Family Medicine

## 2024-02-17 VITALS — BP 124/74 | HR 81 | Resp 16 | Ht 62.0 in | Wt 119.5 lb

## 2024-02-17 DIAGNOSIS — R8290 Unspecified abnormal findings in urine: Secondary | ICD-10-CM | POA: Diagnosis not present

## 2024-02-17 DIAGNOSIS — R10A2 Flank pain, left side: Secondary | ICD-10-CM | POA: Diagnosis not present

## 2024-02-17 LAB — POCT URINALYSIS DIPSTICK
Bilirubin, UA: NEGATIVE
Glucose, UA: NEGATIVE
Ketones, UA: NEGATIVE
Nitrite, UA: NEGATIVE
Protein, UA: NEGATIVE
Spec Grav, UA: 1.015
Urobilinogen, UA: 0.2 U/dL
pH, UA: 5

## 2024-02-17 MED ORDER — CIPROFLOXACIN HCL 250 MG PO TABS: 250.0000 mg | ORAL_TABLET | Freq: Two times a day (BID) | ORAL | 0 refills | Status: DC

## 2024-02-17 MED ORDER — TAMSULOSIN HCL 0.4 MG PO CAPS: 0.4000 mg | ORAL_CAPSULE | Freq: Every day | ORAL | 0 refills | Status: DC

## 2024-02-17 MED ORDER — FLUCONAZOLE 150 MG PO TABS: 150.0000 mg | ORAL_TABLET | ORAL | 0 refills | Status: DC

## 2024-02-17 NOTE — Progress Notes (Signed)
 Name: Carolyn Johnston   MRN: [id number]    DOB: [birth year]   Date:02/17/2024       Progress Note  Subjective  Chief Complaint  Chief Complaint  Patient presents with   Dysuria    Discussed the use of AI scribe software for clinical note transcription with the patient, who gave verbal consent to proceed.  History of Present Illness Carolyn Johnston is a 43 year old female with a history of kidney stones who presents with left flank pain and concerns of a possible urinary tract infection.  She recently saw a urogynecologist for a pessary fitting and had a urine analysis that showed abnormalities, including blood and leukocytes.  She has experienced left flank pain that started in the front and moved to the back a couple of days ago , which she associates with her history of kidney stones. An ultrasound performed at [facility] did not show evidence of a stone, although she has had stones detected by ultrasound in the past. The urologist mentioned the possibility of a stone being too small to detect.  She has a history of kidney stones and has experienced recurrent urinary tract infections since having kidney stones earlier this year. She has been treated with Flomax in the past to help pass stones.  No burning sensation during urination, increased frequency, or nocturia. She does not see visible blood in her urine, although she notes a slight increase in odor. She has no known allergies and is unsure of the specific antibiotics used in past treatments.  She has a history of being hospitalized for uro sepsis twice, which makes her cautious about her current symptoms and potential infections.    Patient Active Problem List   Diagnosis Date Noted   Hospital discharge follow-up 03/23/2023   Positive blood culture 03/23/2023   Pyelonephritis 03/23/2023   SUI (stress urinary incontinence, female) 11/10/2022   Hordeolum externum of left upper eyelid 11/10/2022   Neck pain, chronic 07/27/2022   B12  deficiency 07/27/2022   Encounter for screening for HIV 07/27/2022   Need for hepatitis C screening test 07/27/2022   Abnormal glucose 07/27/2022   High serum cortisol 06/28/2021   Brittle nails 06/26/2021   Hair loss 03/24/2021   Birth control counseling 03/24/2021   Herpes 03/24/2021   Vitamin D  insufficiency 06/06/2016   Recurrent occipital headache 04/22/2016   Bilateral occipital neuralgia 04/22/2016   Cervico-occipital neuralgia of right side 04/22/2016   Cervico-occipital neuralgia of left side 04/22/2016   Chronic pain syndrome 04/22/2016   Vestibular migraine 06/01/2013    Social History   Tobacco Use   Smoking status: Former    Current packs/day: 0.00    Types: Cigarettes    Quit date: 06/29/2008    Years since quitting: 15.6   Smokeless tobacco: Never  Substance Use Topics   Alcohol use: No    Current Medications[1]  Allergies[2]  ROS  Ten systems reviewed and is negative except as mentioned in HPI    Objective  Vitals:   02/17/24 1531  BP: 124/74  Pulse: 81  Resp: 16  SpO2: 99%  Weight: 119 lb 8 oz (54.2 kg)  Height: 5' 2 (1.575 m)    Body mass index is 21.86 kg/m.   Physical Exam CONSTITUTIONAL: Patient appears well-developed and well-nourished. No distress. HEENT: Head atraumatic, normocephalic, neck supple. CARDIOVASCULAR: Normal rate, regular rhythm and normal heart sounds. No murmur heard. No BLE edema. PULMONARY: Effort normal and breath sounds normal. No respiratory distress. ABDOMINAL: There  is no tenderness or distention. No costovertebral angle tenderness. MUSCULOSKELETAL: Normal gait. Without gross motor or sensory deficit. PSYCHIATRIC: Patient has a normal mood and affect. Behavior is normal. Judgment and thought content normal.  Recent Results (from the past 2160 hours)  Cervicovaginal ancillary only     Status: Abnormal   Collection Time: 11/24/23  4:03 PM  Result Value Ref Range   Neisseria Gonorrhea Negative    Chlamydia  Negative    Trichomonas Negative    Bacterial Vaginitis (gardnerella) Negative    Candida Vaginitis Positive (A)    Candida Glabrata Negative    Comment      Normal Reference Range Bacterial Vaginosis - Negative   Comment Normal Reference Range Candida Species - Negative    Comment Normal Reference Range Candida Galbrata - Negative    Comment Normal Reference Range Trichomonas - Negative    Comment Normal Reference Ranger Chlamydia - Negative    Comment      Normal Reference Range Neisseria Gonorrhea - Negative  Hepatitis C antibody     Status: None   Collection Time: 11/24/23  4:14 PM  Result Value Ref Range   Hep C Virus Ab Non Reactive Non Reactive    Comment: HCV antibody alone does not differentiate between previously resolved infection and active infection. Equivocal and Reactive HCV antibody results should be followed up with an HCV RNA test to support the diagnosis of active HCV infection.   RPR     Status: None   Collection Time: 11/24/23  4:14 PM  Result Value Ref Range   RPR Ser Ql Non Reactive Non Reactive  HIV Antibody (routine testing w rflx)     Status: None   Collection Time: 11/24/23  4:14 PM  Result Value Ref Range   HIV Screen 4th Generation wRfx Non Reactive Non Reactive    Comment: HIV-1/HIV-2 antibodies and HIV-1 p24 antigen were NOT detected. There is no laboratory evidence of HIV infection. HIV Negative   TSH     Status: None   Collection Time: 02/03/24  3:49 PM  Result Value Ref Range   TSH 0.66 mIU/L    Comment:           Reference Range .           > or = 20 Years  0.40-4.50 .                Pregnancy Ranges           First trimester    0.26-2.66           Second trimester   0.55-2.73           Third trimester    0.43-2.91   FSH/LH     Status: Abnormal   Collection Time: 02/03/24  3:49 PM  Result Value Ref Range   FSH <0.7 (L) mIU/mL    Comment:                     Reference Range .              Follicular Phase       2.5-10.2               Mid-cycle Peak         3.1-17.7              Luteal Phase           1.5- 9.1  Postmenopausal       23.0-116.3              .    LH <0.2 (L) mIU/mL    Comment:     Reference Range Follicular Phase  1.9-12.5 Mid-Cycle Peak    8.7-76.3 Luteal Phase      0.5-16.9 Postmenopausal    10.0-54.7   Testosterone  , Free and Total     Status: None   Collection Time: 02/03/24  3:50 PM  Result Value Ref Range   Testosterone , Total, LC-MS-MS 6 2 - 45 ng/dL    Comment: . For additional information, please refer to https://education.[facility].com/faq/FAQ165 (This link is being provided for informational/educational purposes only.) (Note) . This test was developed and its analytical performance  characteristics have been determined by [facility]. It has  not been cleared or approved by the FDA. This assay has  been validated pursuant to the CLIA regulations and is  used for clinical purposes. . .    Free Testosterone  0.3 0.1 - 6.4 pg/mL    Comment: (Note) This test was developed and its analytical performance  characteristics have been determined by [facility]. It has  not been cleared or approved by the FDA. This assay has  been validated pursuant to the CLIA regulations and is  used for clinical purposes. . [facility] [address] [place] [address] [phone] Johanna Agent L. Gino, MD, PhD   POCT urinalysis dipstick     Status: Abnormal   Collection Time: 02/17/24  3:37 PM  Result Value Ref Range   Color, UA Yellow    Clarity, UA Cloudy    Glucose, UA Negative Negative   Bilirubin, UA Negative    Ketones, UA Negative    Spec Grav, UA 1.015 1.010 - 1.025   Blood, UA Large    pH, UA 5.0 5.0 - 8.0   Protein, UA Negative Negative   Urobilinogen, UA 0.2 0.2 or 1.0 E.U./dL   Nitrite, UA Negative    Leukocytes, UA Large (3+) (A) Negative   Appearance yellow    Odor foul       Assessment & Plan  left flank pain Left  flank pain with hematuria and leukocyturia. Ultrasound negative for stones, but small stones possible. Differential includes nephrolithiasis versus UTI. History of stones and sepsis increases infection concern. Treated with antibiotics due to symptoms and history. - Prescribed Flomax once daily. - Prescribed Ciprofloxacin twice daily for 7 days, stop if urine culture negative. - Ordered urine culture. - Advised to monitor urine culture results on MyChart and contact office if no infection. - Prescribed yeast infection prophylaxis.            [1]  Current Outpatient Medications:    BOTOX 200 units injection, , Disp: , Rfl:    clindamycin-benzoyl peroxide (BENZACLIN) gel, Apply topically every morning., Disp: , Rfl:    cyanocobalamin  (VITAMIN B12) 1000 MCG/ML injection, Inject 1,000 mcg into the muscle every 30 (thirty) days., Disp: , Rfl:    Eptinezumab-jjmr (VYEPTI) 100 MG/ML injection, Inject into the vein., Disp: , Rfl:    ketoconazole  (NIZORAL ) 2 % shampoo, Apply topically 2 (two) times a week., Disp: 120 mL, Rfl: 2   minoxidil  (LONITEN ) 2.5 MG tablet, Take 1 tablet (2.5 mg total) by mouth daily., Disp: 90 tablet, Rfl: 3   norgestimate -ethinyl estradiol  (SPRINTEC 28) 0.25-35 MG-MCG tablet, Take 1 tablet by mouth daily. CONTINUOUS DOSING, Disp: 120 tablet, Rfl: 3   spironolactone  (ALDACTONE ) 50 MG  tablet, Take 50 mg by mouth 2 (two) times daily., Disp: , Rfl:    Syringe/Needle, Disp, (SYRINGE 3CC/25GX1) 25G X 1 3 ML MISC, To be used with Vit B12 1000 mcg IM once a week for 4 weeks then once a month for 4 months, Disp: , Rfl:    tretinoin (RETIN-A) 0.1 % cream, SMARTSIG:Sparingly Topical, Disp: , Rfl:    valACYclovir  (VALTREX ) 1000 MG tablet, Take 1 tablet (1,000 mg total) by mouth daily., Disp: 90 tablet, Rfl: 3 [2] No Known Allergies

## 2024-02-18 ENCOUNTER — Ambulatory Visit: Admitting: Family

## 2024-02-19 LAB — URINE CULTURE
MICRO NUMBER:: 17410299
SPECIMEN QUALITY:: ADEQUATE

## 2024-02-20 ENCOUNTER — Ambulatory Visit: Payer: Self-pay | Admitting: Family Medicine

## 2024-02-24 ENCOUNTER — Other Ambulatory Visit: Payer: Self-pay | Admitting: Family Medicine

## 2024-02-24 ENCOUNTER — Ambulatory Visit: Payer: Self-pay

## 2024-02-24 MED ORDER — AMOXICILLIN 500 MG PO CAPS: 500.0000 mg | ORAL_CAPSULE | Freq: Three times a day (TID) | ORAL | 0 refills | Status: DC

## 2024-02-24 NOTE — Telephone Encounter (Signed)
 FYI Only or Action Required?: Action required by provider: update on patient condition.  Patient was last seen in primary care on 02/17/2024 by Carolyn Mire, MD.  Called Nurse Triage reporting Flank Pain.  Symptoms began a week ago.  Interventions attempted: Prescription medications:  SABRA  Symptoms are: unchanged. Continues to have flank pain. Requests another antibiotic be called in  Triage Disposition: See Physician Within 24 Hours  Patient/caregiver understands and will follow disposition?: No, wishes to speak with PCP      Copied from CRM [id number]. Topic: Clinical - Red Word Triage >> Feb 24, 2024 11:54 AM Carolyn Johnston wrote: Red Word that prompted transfer to Nurse Triage: pt is still having flank pain after getting a round of antibiotics Reason for Disposition  MODERATE pain (e.g., interferes with normal activities or awakens from sleep)  Answer Assessment - Initial Assessment Questions 1. LOCATION: Where does it hurt? (e.g., left, right)     left 2. ONSET: When did the pain start?     1 week 3. SEVERITY: How bad is the pain? (e.g., Scale 1-10; mild, moderate, or severe)     3-4 4. PATTERN: Does the pain come and go, or is it constant?      Comes and goes 5. CAUSE: What do you think is causing the pain?     UTI 6. OTHER SYMPTOMS:  Do you have any other symptoms? (e.g., fever, abdomen pain, vomiting, leg weakness, burning with urination, blood in urine)     Pain has increased 7. PREGNANCY:  Is there any chance you are pregnant? When was your last menstrual period?     no  Protocols used: Flank Pain-A-AH

## 2024-02-24 NOTE — Telephone Encounter (Signed)
 Pt.notified

## 2024-03-01 ENCOUNTER — Ambulatory Visit: Admitting: Licensed Practical Nurse

## 2024-03-01 ENCOUNTER — Encounter: Payer: Self-pay | Admitting: Internal Medicine

## 2024-03-01 NOTE — Telephone Encounter (Signed)
 Paper printed to be filled out

## 2024-03-09 ENCOUNTER — Ambulatory Visit: Payer: Self-pay

## 2024-03-09 NOTE — Telephone Encounter (Signed)
 FYI Only or Action Required?: FYI only for provider: requested sdv are not available, patient electing to proceed to urgent care.  Patient was last seen in primary care on 02/17/2024 by Glenard Mire, MD.  Called Nurse Triage reporting Cough.  Symptoms began 03/05/24.  Interventions attempted: OTC medications: Muciinex, Vicks and Other: Humidifier.  Symptoms are: gradually worsening.  Triage Disposition: Home Care  Patient/caregiver understands and will follow disposition?: No electing to go to urgent care  Reason for Disposition  Cough  Answer Assessment - Initial Assessment Questions Patient called in to request SDV for productive cough and nasal congestion. Cough and congestion began on 03/05/24, she has been treating with Mucinex, Vicks, and humidifier without improvement. She reports productive cough of yellow sputum that is worsening. Denies chest pain and shortness of breath. She reports she gets 'Bronchitis easily and wants an exam and treatment to prevent her illness from turning into bronchitis or pneumonia. No same day visits available, offered acute visit on 03/10/24 which she declines and plans to proceed to urgent care today for evaluation and treatment.   1. ONSET: When did the cough begin?      Friday 2. SEVERITY: How bad is the cough today?      Worsening becoming more frequent  3. SPUTUM: Describe the color of your sputum (e.g., none, dry cough; clear, white, yellow, green)     Yellowish  4. HEMOPTYSIS: Are you coughing up any blood? If Yes, ask: How much? (e.g., flecks, streaks, tablespoons, etc.)     Denies  5. DIFFICULTY BREATHING: Are you having difficulty breathing? If Yes, ask: How bad is it? (e.g., mild, moderate, severe)      Denies  6. FEVER: Do you have a fever? If Yes, ask: What is your temperature, how was it measured, and when did it start?     Denies  7. CARDIAC HISTORY: Do you have any history of heart disease? (e.g., heart  attack, congestive heart failure)       8. LUNG HISTORY: Do you have any history of lung disease?  (e.g., pulmonary embolus, asthma, emphysema)     Bronchitis  9. PE RISK FACTORS: Do you have a history of blood clots? (or: recent major surgery, recent prolonged travel, bedridden)      10. OTHER SYMPTOMS: Do you have any other symptoms? (e.g., runny nose, wheezing, chest pain)       Nasal congestion 11. PREGNANCY: Is there any chance you are pregnant? When was your last menstrual period?        12. TRAVEL: Have you traveled out of the country in the last month? (e.g., travel history, exposures)  Protocols used: Cough - Acute Productive-A-AH  Summary: Coughing yellowish mucus  Gets bronchitis easily   Reason for Triage:  Congestion Coughing yellowish mucus Gets bronchitis easily Scheduled for infusion 03/10/24

## 2024-03-09 NOTE — Progress Notes (Unsigned)
 GYN ENCOUNTER NOTE  Subjective:       Carolyn Johnston is a 44 y.o. [id number] female is here for gynecologic evaluation of the following issues:  1. Change in appearance of labia. Pt states that she looks different . That the labial have become more lax and the skin is darker and lose, not a plump. She has concern of perimenopausal symptoms. Hair loss, vaginal dryness, and vaginal changes.     Gynecologic History No LMP recorded (exact date). (Menstrual status: Oral contraceptives). Contraception: OCP (estrogen/progesterone) Last Pap: 04/04/22. Results were: normal Last mammogram: 05/06/23. Results were: normal  Obstetric History OB History  Gravida Para Term Preterm AB Living  3 2   1    SAB IAB Ectopic Multiple Live Births  1        # Outcome Date GA Lbr Len/2nd Weight Sex Type Anes PTL Lv  3 SAB           2 Para           1 Para             Past Medical History:  Diagnosis Date   Allergy    trees, dust mites   COVID-19    x2   Migraines    Vitamin D  deficiency     Past Surgical History:  Procedure Laterality Date   adnoids suture     DILATION AND CURETTAGE OF UTERUS     TONSILLECTOMY     WISDOM TOOTH EXTRACTION      Medications Ordered Prior to Encounter[1]  Allergies[2]  Social History   Socioeconomic History   Marital status: Single    Spouse name: Not on file   Number of children: Not on file   Years of education: Not on file   Highest education level: Associate degree: occupational, scientist, product/process development, or vocational program  Occupational History   Not on file  Tobacco Use   Smoking status: Former    Current packs/day: 0.00    Types: Cigarettes    Quit date: 06/29/2008    Years since quitting: 15.7   Smokeless tobacco: Never  Vaping Use   Vaping status: Never Used  Substance and Sexual Activity   Alcohol use: No   Drug use: No   Sexual activity: Not Currently    Birth control/protection: Pill, Condom  Other Topics Concern   Not on file  Social History  Narrative   Works  [employer]   2 daughter (11 and 107 as of 11/21/21)   Social Drivers of Health   Tobacco Use: Medium Risk (02/17/2024)   Patient History    Smoking Tobacco Use: Former    Smokeless Tobacco Use: Never    Passive Exposure: Not on Actuary Strain: Low Risk (02/16/2024)   Overall Financial Resource Strain (CARDIA)    Difficulty of Paying Living Expenses: Not hard at all  Food Insecurity: No Food Insecurity (02/16/2024)   Epic    Worried About Radiation Protection Practitioner of Food in the Last Year: Never true    Ran Out of Food in the Last Year: Never true  Transportation Needs: No Transportation Needs (02/16/2024)   Epic    Lack of Transportation (Medical): No    Lack of Transportation (Non-Medical): No  Physical Activity: Insufficiently Active (02/16/2024)   Exercise Vital Sign    Days of Exercise per Week: 3 days    Minutes of Exercise per Session: 20 min  Stress: No Stress Concern Present (02/16/2024)   [employer] of Occupational  Health - Occupational Stress Questionnaire    Feeling of Stress: Not at all  Social Connections: Moderately Integrated (02/16/2024)   Social Connection and Isolation Panel    Frequency of Communication with Friends and Family: More than three times a week    Frequency of Social Gatherings with Friends and Family: Twice a week    Attends Religious Services: More than 4 times per year    Active Member of [employer] or Organizations: Yes    Attends Engineer, Structural: More than 4 times per year    Marital Status: Divorced  Intimate Partner Violence: Not on file  Depression (PHQ2-9): Low Risk (02/17/2024)   Depression (PHQ2-9)    PHQ-2 Score: 0  Alcohol Screen: Not on file  Housing: Unknown (02/16/2024)   Epic    Unable to Pay for Housing in the Last Year: No    Number of Times Moved in the Last Year: Not on file    Homeless in the Last Year: No  Utilities: Not on file  Health Literacy: Not on file    Family History   Problem Relation Age of Onset   Arthritis Mother        RA since 36s   Headache Mother    Fibroids Mother    Rheum arthritis Mother    Drug abuse Father    Diabetes Maternal Grandmother    Parkinson's disease Maternal Grandfather     The following portions of the patient's history were reviewed and updated as appropriate: allergies, current medications, past family history, past medical history, past social history, past surgical history and problem list.  Review of Systems Review of Systems - Negative except as mentioned in HPI Review of Systems - General ROS: negative for - chills, fatigue, fever, hot flashes, malaise or night sweats Hematological and Lymphatic ROS: negative for - bleeding problems or swollen lymph nodes Gastrointestinal ROS: negative for - abdominal pain, blood in stools, change in bowel habits and nausea/vomiting Musculoskeletal ROS: negative for - joint pain, muscle pain or muscular weakness Genito-Urinary ROS: negative for - change in menstrual cycle, dysmenorrhea, dyspareunia, dysuria, genital discharge, genital ulcers, hematuria, incontinence, irregular/heavy menses, nocturia or pelvic pain. Change in appearance to external genitalia, vaginal dryness and vaginal skin changes  Objective:   BP 119/73   Pulse (!) 103   Ht 5' 2 (1.575 m)   Wt 119 lb 3.2 oz (54.1 kg)   LMP  (Exact Date)   BMI 21.80 kg/m  CONSTITUTIONAL: Well-developed, well-nourished female in no acute distress.  HENT:  Normocephalic, atraumatic.  NECK: Normal range of motion, supple, no masses.  Normal thyroid .  SKIN: Skin is warm and dry. No rash noted. Not diaphoretic. No erythema. No pallor. NEUROLGIC: Alert and oriented to person, place, and time. PSYCHIATRIC: Normal mood and affect. Normal behavior. Normal judgment and thought content. CARDIOVASCULAR:Not Examined RESPIRATORY: Not Examined BREASTS: Not Examined ABDOMEN: Soft, non distended; Non tender.  No Organomegaly. PELVIC:not  indicated Normal appearing external genitalia with a reduced elasticity in the labia minor causing a sagging for the skin.  MUSCULOSKELETAL: Normal range of motion. No tenderness.  No cyanosis, clubbing, or edema.     Assessment:   Vaginal dryness Change in appearance external genitalia    Plan:   Reassurnance given . Discussed cosmetic procedure to reduce excess skin for appearance. Discussed trial of vaginal estrogen due to dryness and vaginal atrophic changes related to perimenopause. OCP changed to higher dose hormone to see if helps inprove perimenopausal symptoms. Discussed  transition form OCP to HT. Discussed IUD and patch . She declines at this time . Reviewed risk of OCP she verbalizes understanding. Orders placed .   Zelda Hummer, CNM      [1]  Current Outpatient Medications on File Prior to Visit  Medication Sig Dispense Refill   acetaminophen (TYLENOL) 325 MG tablet Take 650 mg by mouth every 6 (six) hours as needed.     BOTOX 200 units injection      brompheniramine-pseudoephedrine-DM 30-2-10 MG/5ML syrup Take by mouth.     clindamycin-benzoyl peroxide (BENZACLIN) gel Apply topically every morning.     cyanocobalamin  (VITAMIN B12) 1000 MCG/ML injection Inject 1,000 mcg into the muscle every 30 (thirty) days.     Eptinezumab-jjmr (VYEPTI) 100 MG/ML injection Inject into the vein.     ibuprofen (ADVIL) 200 MG tablet Take 200 mg by mouth every 6 (six) hours as needed.     ketoconazole  (NIZORAL ) 2 % shampoo Apply topically 2 (two) times a week. 120 mL 2   minoxidil  (LONITEN ) 2.5 MG tablet Take 1 tablet (2.5 mg total) by mouth daily. 90 tablet 3   norgestimate -ethinyl estradiol  (SPRINTEC 28) 0.25-35 MG-MCG tablet Take 1 tablet by mouth daily. CONTINUOUS DOSING 120 tablet 3   predniSONE (DELTASONE) 20 MG tablet Take by mouth.     spironolactone  (ALDACTONE ) 50 MG tablet Take 50 mg by mouth 2 (two) times daily.     Syringe/Needle, Disp, (SYRINGE 3CC/25GX1) 25G X 1 3 ML  MISC To be used with Vit B12 1000 mcg IM once a week for 4 weeks then once a month for 4 months     tamsulosin  (FLOMAX ) 0.4 MG CAPS capsule Take 1 capsule (0.4 mg total) by mouth daily. 30 capsule 0   tretinoin (RETIN-A) 0.1 % cream SMARTSIG:Sparingly Topical     valACYclovir  (VALTREX ) 1000 MG tablet Take 1 tablet (1,000 mg total) by mouth daily. 90 tablet 3   No current facility-administered medications on file prior to visit.  [2] No Known Allergies

## 2024-03-10 ENCOUNTER — Other Ambulatory Visit: Payer: Self-pay | Admitting: Family Medicine

## 2024-03-10 ENCOUNTER — Encounter: Payer: Self-pay | Admitting: Certified Nurse Midwife

## 2024-03-10 ENCOUNTER — Ambulatory Visit: Admitting: Certified Nurse Midwife

## 2024-03-10 VITALS — BP 119/73 | HR 103 | Ht 62.0 in | Wt 119.2 lb

## 2024-03-10 DIAGNOSIS — N951 Menopausal and female climacteric states: Secondary | ICD-10-CM

## 2024-03-10 DIAGNOSIS — N898 Other specified noninflammatory disorders of vagina: Secondary | ICD-10-CM

## 2024-03-10 DIAGNOSIS — R10A2 Flank pain, left side: Secondary | ICD-10-CM

## 2024-03-10 MED ORDER — ESTRADIOL 0.01 % VA CREA: 0.5000 | TOPICAL_CREAM | Freq: Every day | VAGINAL | 12 refills | Status: AC

## 2024-03-10 MED ORDER — NORETHINDRONE-ETH ESTRADIOL 1-35 MG-MCG PO TABS: 1.0000 | ORAL_TABLET | Freq: Every day | ORAL | 4 refills | Status: AC

## 2024-03-10 NOTE — Telephone Encounter (Signed)
 Requested Prescriptions  Pending Prescriptions Disp Refills   tamsulosin  (FLOMAX ) 0.4 MG CAPS capsule [Pharmacy Med Name: TAMSULOSIN  HCL 0.4 MG CAPSULE] 90 capsule 1    Sig: TAKE 1 CAPSULE BY MOUTH EVERY DAY     Urology: Alpha-Adrenergic Blocker Failed - 03/10/2024  2:54 PM      Failed - PSA in normal range and within 360 days    No results found for: LABPSA, PSA, PSA1, ULTRAPSA       Passed - Last BP in normal range    BP Readings from Last 1 Encounters:  03/10/24 119/73         Passed - Valid encounter within last 12 months    Recent Outpatient Visits           3 weeks ago Left flank pain   [facility] Glenard Mire, MD   1 month ago Vaginal atrophy   [facility] Bernardo Fend, [place]

## 2024-04-06 ENCOUNTER — Encounter

## 2024-04-13 ENCOUNTER — Ambulatory Visit: Admitting: Obstetrics & Gynecology

## 2024-05-03 ENCOUNTER — Ambulatory Visit: Admitting: Internal Medicine
